# Patient Record
Sex: Female | Born: 1950
Health system: Southern US, Community
[De-identification: ages and names within clinical notes are randomized; demographics above are authoritative.]

## PROBLEM LIST (undated history)

## (undated) DIAGNOSIS — J301 Allergic rhinitis due to pollen: Secondary | ICD-10-CM

## (undated) DIAGNOSIS — M549 Dorsalgia, unspecified: Secondary | ICD-10-CM

## (undated) DIAGNOSIS — IMO0001 Reserved for inherently not codable concepts without codable children: Secondary | ICD-10-CM

## (undated) DIAGNOSIS — R252 Cramp and spasm: Secondary | ICD-10-CM

## (undated) DIAGNOSIS — R6889 Other general symptoms and signs: Secondary | ICD-10-CM

## (undated) DIAGNOSIS — K829 Disease of gallbladder, unspecified: Secondary | ICD-10-CM

## (undated) DIAGNOSIS — R5383 Other fatigue: Secondary | ICD-10-CM

## (undated) DIAGNOSIS — K219 Gastro-esophageal reflux disease without esophagitis: Secondary | ICD-10-CM

## (undated) DIAGNOSIS — M2550 Pain in unspecified joint: Secondary | ICD-10-CM

## (undated) DIAGNOSIS — G4730 Sleep apnea, unspecified: Secondary | ICD-10-CM

## (undated) DIAGNOSIS — F329 Major depressive disorder, single episode, unspecified: Secondary | ICD-10-CM

## (undated) DIAGNOSIS — F32A Depression, unspecified: Secondary | ICD-10-CM

## (undated) DIAGNOSIS — H409 Unspecified glaucoma: Secondary | ICD-10-CM

## (undated) DIAGNOSIS — R51 Headache: Secondary | ICD-10-CM

## (undated) DIAGNOSIS — R7303 Prediabetes: Secondary | ICD-10-CM

## (undated) DIAGNOSIS — E559 Vitamin D deficiency, unspecified: Secondary | ICD-10-CM

## (undated) DIAGNOSIS — H9319 Tinnitus, unspecified ear: Secondary | ICD-10-CM

## (undated) DIAGNOSIS — R531 Weakness: Secondary | ICD-10-CM

## (undated) DIAGNOSIS — F439 Reaction to severe stress, unspecified: Secondary | ICD-10-CM

## (undated) DIAGNOSIS — M5430 Sciatica, unspecified side: Secondary | ICD-10-CM

## (undated) DIAGNOSIS — R002 Palpitations: Secondary | ICD-10-CM

## (undated) DIAGNOSIS — F419 Anxiety disorder, unspecified: Secondary | ICD-10-CM

## (undated) DIAGNOSIS — E739 Lactose intolerance, unspecified: Secondary | ICD-10-CM

## (undated) DIAGNOSIS — M7989 Other specified soft tissue disorders: Secondary | ICD-10-CM

## (undated) DIAGNOSIS — S83209A Unspecified tear of unspecified meniscus, current injury, unspecified knee, initial encounter: Secondary | ICD-10-CM

## (undated) DIAGNOSIS — R519 Headache, unspecified: Secondary | ICD-10-CM

## (undated) DIAGNOSIS — R490 Dysphonia: Secondary | ICD-10-CM

## (undated) DIAGNOSIS — E039 Hypothyroidism, unspecified: Secondary | ICD-10-CM

## (undated) DIAGNOSIS — I10 Essential (primary) hypertension: Secondary | ICD-10-CM

## (undated) DIAGNOSIS — M1990 Unspecified osteoarthritis, unspecified site: Secondary | ICD-10-CM

## (undated) DIAGNOSIS — M6289 Other specified disorders of muscle: Secondary | ICD-10-CM

## (undated) DIAGNOSIS — D649 Anemia, unspecified: Secondary | ICD-10-CM

## (undated) DIAGNOSIS — R633 Feeding difficulties: Secondary | ICD-10-CM

## (undated) DIAGNOSIS — T7840XA Allergy, unspecified, initial encounter: Secondary | ICD-10-CM

## (undated) DIAGNOSIS — E669 Obesity, unspecified: Secondary | ICD-10-CM

## (undated) DIAGNOSIS — K0889 Other specified disorders of teeth and supporting structures: Secondary | ICD-10-CM

## (undated) DIAGNOSIS — K589 Irritable bowel syndrome without diarrhea: Secondary | ICD-10-CM

## (undated) DIAGNOSIS — R450 Nervousness: Secondary | ICD-10-CM

## (undated) DIAGNOSIS — R682 Dry mouth, unspecified: Secondary | ICD-10-CM

## (undated) DIAGNOSIS — E538 Deficiency of other specified B group vitamins: Secondary | ICD-10-CM

## (undated) HISTORY — DX: Allergy, unspecified, initial encounter: T78.40XA

## (undated) HISTORY — DX: Reaction to severe stress, unspecified: F43.9

## (undated) HISTORY — DX: Depression, unspecified: F32.A

## (undated) HISTORY — DX: Tinnitus, unspecified ear: H93.19

## (undated) HISTORY — DX: Deficiency of other specified B group vitamins: E53.8

## (undated) HISTORY — DX: Lactose intolerance, unspecified: E73.9

## (undated) HISTORY — DX: Dorsalgia, unspecified: M54.9

## (undated) HISTORY — DX: Nervousness: R45.0

## (undated) HISTORY — DX: Essential (primary) hypertension: I10

## (undated) HISTORY — DX: Headache, unspecified: R51.9

## (undated) HISTORY — DX: Feeding difficulties: R63.3

## (undated) HISTORY — DX: Allergic rhinitis due to pollen: J30.1

## (undated) HISTORY — DX: Major depressive disorder, single episode, unspecified: F32.9

## (undated) HISTORY — DX: Pain in unspecified joint: M25.50

## (undated) HISTORY — DX: Other specified disorders of muscle: M62.89

## (undated) HISTORY — DX: Palpitations: R00.2

## (undated) HISTORY — DX: Weakness: R53.1

## (undated) HISTORY — DX: Dysphonia: R49.0

## (undated) HISTORY — DX: Disease of gallbladder, unspecified: K82.9

## (undated) HISTORY — DX: Prediabetes: R73.03

## (undated) HISTORY — DX: Other general symptoms and signs: R68.89

## (undated) HISTORY — DX: Other fatigue: R53.83

## (undated) HISTORY — DX: Dry mouth, unspecified: R68.2

## (undated) HISTORY — DX: Irritable bowel syndrome, unspecified: K58.9

## (undated) HISTORY — DX: Other specified soft tissue disorders: M79.89

## (undated) HISTORY — DX: Other specified disorders of teeth and supporting structures: K08.89

## (undated) HISTORY — DX: Vitamin D deficiency, unspecified: E55.9

## (undated) HISTORY — DX: Anemia, unspecified: D64.9

## (undated) HISTORY — DX: Unspecified tear of unspecified meniscus, current injury, unspecified knee, initial encounter: S83.209A

## (undated) HISTORY — DX: Cramp and spasm: R25.2

## (undated) HISTORY — DX: Headache: R51

---

## 1988-11-20 HISTORY — PX: CHOLECYSTECTOMY: SHX55

## 2003-06-22 ENCOUNTER — Encounter: Admission: RE | Admit: 2003-06-22 | Discharge: 2003-07-06 | Payer: Self-pay | Admitting: Occupational Medicine

## 2005-09-11 ENCOUNTER — Other Ambulatory Visit: Admission: RE | Admit: 2005-09-11 | Discharge: 2005-09-11 | Payer: Self-pay | Admitting: Family Medicine

## 2007-05-13 ENCOUNTER — Other Ambulatory Visit: Admission: RE | Admit: 2007-05-13 | Discharge: 2007-05-13 | Payer: Self-pay | Admitting: Family Medicine

## 2008-05-13 ENCOUNTER — Other Ambulatory Visit: Admission: RE | Admit: 2008-05-13 | Discharge: 2008-05-13 | Payer: Self-pay | Admitting: Family Medicine

## 2008-05-15 ENCOUNTER — Ambulatory Visit (HOSPITAL_COMMUNITY): Admission: RE | Admit: 2008-05-15 | Discharge: 2008-05-15 | Payer: Self-pay | Admitting: Cardiology

## 2008-05-16 ENCOUNTER — Emergency Department (HOSPITAL_COMMUNITY): Admission: EM | Admit: 2008-05-16 | Discharge: 2008-05-16 | Payer: Self-pay | Admitting: Emergency Medicine

## 2008-05-30 ENCOUNTER — Emergency Department (HOSPITAL_COMMUNITY): Admission: EM | Admit: 2008-05-30 | Discharge: 2008-05-30 | Payer: Self-pay | Admitting: Emergency Medicine

## 2010-12-07 ENCOUNTER — Encounter
Admission: RE | Admit: 2010-12-07 | Discharge: 2010-12-20 | Payer: Self-pay | Source: Home / Self Care | Attending: Orthopaedic Surgery | Admitting: Orthopaedic Surgery

## 2010-12-22 ENCOUNTER — Ambulatory Visit: Payer: 59 | Attending: Orthopaedic Surgery | Admitting: Physical Therapy

## 2010-12-22 DIAGNOSIS — M542 Cervicalgia: Secondary | ICD-10-CM | POA: Insufficient documentation

## 2010-12-22 DIAGNOSIS — M2569 Stiffness of other specified joint, not elsewhere classified: Secondary | ICD-10-CM | POA: Insufficient documentation

## 2010-12-22 DIAGNOSIS — M545 Low back pain, unspecified: Secondary | ICD-10-CM | POA: Insufficient documentation

## 2010-12-22 DIAGNOSIS — R293 Abnormal posture: Secondary | ICD-10-CM | POA: Insufficient documentation

## 2010-12-22 DIAGNOSIS — IMO0001 Reserved for inherently not codable concepts without codable children: Secondary | ICD-10-CM | POA: Insufficient documentation

## 2010-12-22 DIAGNOSIS — R5381 Other malaise: Secondary | ICD-10-CM | POA: Insufficient documentation

## 2010-12-26 ENCOUNTER — Ambulatory Visit: Payer: 59 | Admitting: Physical Therapy

## 2010-12-29 ENCOUNTER — Encounter: Payer: Commercial Managed Care - PPO | Admitting: Physical Therapy

## 2010-12-30 ENCOUNTER — Ambulatory Visit: Payer: 59 | Admitting: Physical Therapy

## 2011-01-02 ENCOUNTER — Ambulatory Visit: Payer: 59 | Admitting: Physical Therapy

## 2011-01-04 ENCOUNTER — Ambulatory Visit: Payer: 59 | Admitting: Physical Therapy

## 2011-01-11 ENCOUNTER — Encounter: Payer: Commercial Managed Care - PPO | Admitting: Physical Therapy

## 2011-01-18 ENCOUNTER — Ambulatory Visit: Payer: 59 | Admitting: Physical Therapy

## 2011-01-25 ENCOUNTER — Encounter: Payer: Commercial Managed Care - PPO | Admitting: Physical Therapy

## 2011-02-01 ENCOUNTER — Ambulatory Visit: Payer: Commercial Managed Care - PPO | Attending: Orthopaedic Surgery | Admitting: Physical Therapy

## 2011-02-01 DIAGNOSIS — M542 Cervicalgia: Secondary | ICD-10-CM | POA: Insufficient documentation

## 2011-02-01 DIAGNOSIS — IMO0001 Reserved for inherently not codable concepts without codable children: Secondary | ICD-10-CM | POA: Insufficient documentation

## 2011-02-01 DIAGNOSIS — M2569 Stiffness of other specified joint, not elsewhere classified: Secondary | ICD-10-CM | POA: Insufficient documentation

## 2011-02-01 DIAGNOSIS — R293 Abnormal posture: Secondary | ICD-10-CM | POA: Insufficient documentation

## 2011-02-01 DIAGNOSIS — M545 Low back pain, unspecified: Secondary | ICD-10-CM | POA: Insufficient documentation

## 2011-02-01 DIAGNOSIS — R5381 Other malaise: Secondary | ICD-10-CM | POA: Insufficient documentation

## 2011-08-17 LAB — POCT CARDIAC MARKERS
CKMB, poc: 4.1
Myoglobin, poc: 157
Operator id: 294521

## 2011-08-17 LAB — DIFFERENTIAL
Basophils Absolute: 0
Eosinophils Absolute: 0.1
Lymphocytes Relative: 19
Lymphs Abs: 2.1
Neutrophils Relative %: 72

## 2011-08-17 LAB — POCT I-STAT, CHEM 8
Creatinine, Ser: 1
HCT: 41
Hemoglobin: 13.9
Potassium: 3.8
Sodium: 136

## 2011-08-17 LAB — CBC
MCV: 90.9
Platelets: 371
WBC: 11 — ABNORMAL HIGH

## 2011-08-21 ENCOUNTER — Other Ambulatory Visit: Payer: Self-pay | Admitting: Family Medicine

## 2011-08-21 ENCOUNTER — Other Ambulatory Visit (HOSPITAL_COMMUNITY)
Admission: RE | Admit: 2011-08-21 | Discharge: 2011-08-21 | Disposition: A | Discharge status: Home / Self Care | Payer: Commercial Managed Care - PPO | Source: Ambulatory Visit | Attending: Family Medicine | Admitting: Family Medicine

## 2011-08-21 DIAGNOSIS — Z0000 Encounter for general adult medical examination without abnormal findings: Secondary | ICD-10-CM | POA: Insufficient documentation

## 2011-10-19 ENCOUNTER — Ambulatory Visit (HOSPITAL_COMMUNITY)
Admission: RE | Admit: 2011-10-19 | Discharge: 2011-10-19 | Disposition: A | Discharge status: Home / Self Care | Payer: 59 | Source: Ambulatory Visit | Attending: Cardiology | Admitting: Cardiology

## 2011-10-19 DIAGNOSIS — R079 Chest pain, unspecified: Secondary | ICD-10-CM | POA: Insufficient documentation

## 2011-10-19 DIAGNOSIS — I10 Essential (primary) hypertension: Secondary | ICD-10-CM | POA: Insufficient documentation

## 2012-05-29 ENCOUNTER — Ambulatory Visit: Payer: 59 | Attending: Orthopaedic Surgery

## 2012-05-29 DIAGNOSIS — IMO0001 Reserved for inherently not codable concepts without codable children: Secondary | ICD-10-CM | POA: Insufficient documentation

## 2012-05-29 DIAGNOSIS — M545 Low back pain, unspecified: Secondary | ICD-10-CM | POA: Insufficient documentation

## 2012-05-29 DIAGNOSIS — R5381 Other malaise: Secondary | ICD-10-CM | POA: Insufficient documentation

## 2012-05-29 DIAGNOSIS — M25569 Pain in unspecified knee: Secondary | ICD-10-CM | POA: Insufficient documentation

## 2012-06-03 ENCOUNTER — Ambulatory Visit: Payer: 59 | Admitting: Physical Therapy

## 2012-06-10 ENCOUNTER — Ambulatory Visit: Payer: 59 | Admitting: Physical Therapy

## 2012-06-17 ENCOUNTER — Ambulatory Visit: Payer: 59 | Admitting: Physical Therapy

## 2012-06-24 ENCOUNTER — Ambulatory Visit: Payer: 59 | Attending: Orthopaedic Surgery | Admitting: Physical Therapy

## 2012-06-24 DIAGNOSIS — IMO0001 Reserved for inherently not codable concepts without codable children: Secondary | ICD-10-CM | POA: Insufficient documentation

## 2012-06-24 DIAGNOSIS — M545 Low back pain, unspecified: Secondary | ICD-10-CM | POA: Insufficient documentation

## 2012-06-24 DIAGNOSIS — R5381 Other malaise: Secondary | ICD-10-CM | POA: Insufficient documentation

## 2012-06-24 DIAGNOSIS — M25569 Pain in unspecified knee: Secondary | ICD-10-CM | POA: Insufficient documentation

## 2012-08-21 ENCOUNTER — Ambulatory Visit (INDEPENDENT_AMBULATORY_CARE_PROVIDER_SITE_OTHER): Payer: 59 | Admitting: Endocrinology

## 2012-08-21 ENCOUNTER — Encounter: Payer: Self-pay | Admitting: Endocrinology

## 2012-08-21 VITALS — BP 116/82 | HR 80 | Temp 97.8°F | Resp 16 | Wt 219.3 lb

## 2012-08-21 DIAGNOSIS — R002 Palpitations: Secondary | ICD-10-CM

## 2012-08-21 DIAGNOSIS — E039 Hypothyroidism, unspecified: Secondary | ICD-10-CM | POA: Insufficient documentation

## 2012-08-21 NOTE — Progress Notes (Signed)
  Subjective:    Patient ID: Kayla Marshall, female    DOB: August 02, 1951, 61 y.o.   MRN: [id number]  HPI Pt states 12 years of slight dryness of the hands, and assoc anxiety.  She reports many years of hypothyroidism.   Past Medical History  Diagnosis Date  . Hypertension     Past Surgical History  Procedure Date  . Cholecystectomy 1990    History   Social History  . Marital Status: Divorced    Spouse Name: N/A    Number of Children: N/A  . Years of Education: N/A   Occupational History  . Not on file.   Social History Main Topics  . Smoking status: Never Smoker   . Smokeless tobacco: Not on file  . Alcohol Use: Yes  . Drug Use: No  . Sexually Active: Not on file   Other Topics Concern  . Not on file   Social History Narrative  . No narrative on file    Current Outpatient Prescriptions on File Prior to Visit  Medication Sig Dispense Refill  . lisinopril (PRINIVIL,ZESTRIL) 2.5 MG tablet Take 2.5 mg by mouth daily.      Marland Kitchen thyroid (ARMOUR) 15 MG tablet Take 15 mg by mouth daily.        Allergies  Allergen Reactions  . Erythromycin     No family history on file.  BP 116/82  Pulse 80  Temp 97.8 F (36.6 C) (Oral)  Resp 16  Wt 219 lb 5 oz (99.479 kg)  SpO2 96%  Review of Systems Denies headache, hoarseness, double vision, sob, diarrhea, polyuria, myalgias, excessive diaphoresis, numbness, tremor, anxiety, hypoglycemia, easy bruising, and rhinorrhea.  She has difficulty with concentration, palpitations, heat intolerance, and difficulty losing weight.      Objective:   Physical Exam VS: see vs page GEN: no distress HEAD: head: no deformity eyes: no periorbital swelling, no proptosis external nose and ears are normal mouth: no lesion seen NECK: supple, thyroid is not enlarged CHEST WALL: no deformity LUNGS:  Clear to auscultation CV: reg rate and rhythm, no murmur ABD: abdomen is soft, nontender.  no hepatosplenomegaly.  not distended.  no  hernia MUSCULOSKELETAL: muscle bulk and strength are grossly normal.  no obvious joint swelling.  gait is normal and steady EXTEMITIES: no deformity.  no ulcer on the feet.  feet are of normal color and temp.  no edema PULSES: dorsalis pedis intact bilat.  no carotid bruit NEURO:  cn 2-12 grossly intact.   readily moves all 4's.  sensation is intact to touch on the feet SKIN:  Normal texture and temperature.  No rash or suspicious lesion is visible.   NODES:  None palpable at the neck PSYCH: alert, oriented x3.  Does not appear anxious nor depressed.  Lab Results  Component Value Date   TSH 1.225 08/21/2012      Assessment & Plan:  Reported h/o primary hypothyroidism, on replacement.  TSH is normal, but this cannot be interpreted as euthyroidism in the setting of armour thyroid intake Anxiety; uncertain if this is related to armour thyroid. Obesity, not thyroid-related

## 2012-08-21 NOTE — Patient Instructions (Addendum)
blood and urine tests are being requested for you today.  You will be contacted with results.

## 2012-08-23 ENCOUNTER — Other Ambulatory Visit: Payer: Self-pay | Admitting: Endocrinology

## 2012-08-23 ENCOUNTER — Telehealth: Payer: Self-pay | Admitting: General Practice

## 2012-08-23 DIAGNOSIS — E039 Hypothyroidism, unspecified: Secondary | ICD-10-CM

## 2012-08-23 LAB — CATECHOLAMINES, FRACTIONATED, URINE, 24 HOUR

## 2012-08-23 LAB — METANEPHRINES, URINE, 24 HOUR

## 2012-08-23 MED ORDER — LEVOTHYROXINE SODIUM 75 MCG PO TABS: 75.0000 ug | ORAL_TABLET | Freq: Every day | ORAL | Status: DC

## 2012-08-23 NOTE — Telephone Encounter (Signed)
Pt stated she will be fine with the new Rx for Levothyroxine. Please send to [facility].

## 2012-08-23 NOTE — Telephone Encounter (Signed)
Message copied by Jackson Latino on Fri Aug 23, 2012  9:33 AM ------      Message from: Romero Belling      Created: Thu Aug 22, 2012 11:00 AM       please call patient:      Thyroid is normal      we'll call back with urine results

## 2012-08-23 NOTE — Telephone Encounter (Signed)
Pt.notified

## 2012-08-23 NOTE — Telephone Encounter (Signed)
i changed and sent rx Please go back to the lab in 1 month, to recheck thyroid blood test

## 2012-09-12 ENCOUNTER — Telehealth: Payer: Self-pay | Admitting: Endocrinology

## 2012-09-12 LAB — CATECHOLAMINES, FRACTIONATED, URINE, 24 HOUR
Calculated Total (E+NE): 26 mcg/24 h (ref 26–121)
Creatinine, Urine mg/day-CATEUR: 1 g/(24.h) (ref 0.63–2.50)
Dopamine, 24 hr Urine: 191 mcg/24 h (ref 52–480)
Norepinephrine, 24 hr Ur: 26 mcg/24 h (ref 15–100)

## 2012-09-12 NOTE — Telephone Encounter (Signed)
Please take the levothyroxine, as the other is not safe for you.

## 2012-09-12 NOTE — Telephone Encounter (Signed)
LMOVM notifying Pt that med was unsafe for her and she needs to stay with levothyroxine.

## 2012-09-12 NOTE — Telephone Encounter (Signed)
Last time pt was here, Dr. Everardo All changed her thyroid scrip to Synthroid for cost purposes. Pt thought about it and wants to continue taking the compound she was taking before, USP 15 mg. Can we call an rx in to [facility] at [phone]?

## 2014-11-24 ENCOUNTER — Other Ambulatory Visit (HOSPITAL_COMMUNITY): Payer: Self-pay | Admitting: Family Medicine

## 2014-11-24 ENCOUNTER — Other Ambulatory Visit: Payer: Self-pay | Admitting: Family Medicine

## 2014-11-24 DIAGNOSIS — M5441 Lumbago with sciatica, right side: Secondary | ICD-10-CM

## 2014-11-30 ENCOUNTER — Ambulatory Visit
Admission: RE | Admit: 2014-11-30 | Discharge: 2014-11-30 | Disposition: A | Discharge status: Home / Self Care | Payer: 59 | Source: Ambulatory Visit | Attending: Family Medicine | Admitting: Family Medicine

## 2014-11-30 DIAGNOSIS — M5441 Lumbago with sciatica, right side: Secondary | ICD-10-CM

## 2014-12-01 ENCOUNTER — Other Ambulatory Visit: Payer: Self-pay | Admitting: Family Medicine

## 2014-12-01 DIAGNOSIS — M549 Dorsalgia, unspecified: Secondary | ICD-10-CM

## 2014-12-14 ENCOUNTER — Ambulatory Visit
Admission: RE | Admit: 2014-12-14 | Discharge: 2014-12-14 | Disposition: A | Discharge status: Home / Self Care | Payer: 59 | Source: Ambulatory Visit | Attending: Family Medicine | Admitting: Family Medicine

## 2014-12-14 DIAGNOSIS — M549 Dorsalgia, unspecified: Secondary | ICD-10-CM

## 2015-04-05 DIAGNOSIS — I10 Essential (primary) hypertension: Secondary | ICD-10-CM | POA: Insufficient documentation

## 2015-05-05 ENCOUNTER — Ambulatory Visit (HOSPITAL_BASED_OUTPATIENT_CLINIC_OR_DEPARTMENT_OTHER): Payer: 59

## 2015-05-10 ENCOUNTER — Ambulatory Visit (HOSPITAL_BASED_OUTPATIENT_CLINIC_OR_DEPARTMENT_OTHER): Payer: 59

## 2015-05-12 ENCOUNTER — Ambulatory Visit (HOSPITAL_BASED_OUTPATIENT_CLINIC_OR_DEPARTMENT_OTHER): Payer: 59 | Attending: Family Medicine | Admitting: Radiology

## 2015-05-12 DIAGNOSIS — G4710 Hypersomnia, unspecified: Secondary | ICD-10-CM | POA: Diagnosis present

## 2015-05-12 DIAGNOSIS — R0683 Snoring: Secondary | ICD-10-CM | POA: Insufficient documentation

## 2015-05-12 DIAGNOSIS — G4731 Primary central sleep apnea: Secondary | ICD-10-CM | POA: Diagnosis not present

## 2015-05-12 DIAGNOSIS — G4733 Obstructive sleep apnea (adult) (pediatric): Secondary | ICD-10-CM | POA: Diagnosis not present

## 2015-05-22 DIAGNOSIS — G4733 Obstructive sleep apnea (adult) (pediatric): Secondary | ICD-10-CM | POA: Diagnosis not present

## 2015-05-22 NOTE — Progress Notes (Signed)
    NAME: Kayla Marshall DATE OF BIRTH:  Dec 18, 1950 MEDICAL RECORD NUMBER [id number]  LOCATION: [facility]  PHYSICIAN: Charlesetta Milliron D  DATE OF STUDY: 05/12/2015  SLEEP STUDY TYPE: Out of Center Sleep Test- Unattended                REFERRING PHYSICIAN: Harlan Stains, MD  INDICATION FOR STUDY: Hypersomnia with sleep apnea  EPWORTH SLEEPINESS SCORE:  9/24 HEIGHT:   5'3" WEIGHT:   230 lbs   BMI 41 NECK SIZE:   in.  MEDICATIONS: Charted for review  IMPRESSION:  Moderate obstructive and central sleep apnea/hypopnea syndrome, AHI 22.5 per hour. 146 total events scored including 28 central apneas, 18 obstructive apneas, 2 mixed apneas, 98 hypopneas. Sleep position not specified. Snoring with oxygen desaturation to a nadir of 81% and average oxygen saturation 92% on room air. Mean heart rate 69.4 bpm.    RECOMMENDATION:  Scores in this range would usually be addressed first with CPAP. Other interventions including oral appliances and surgical evaluation might be considered on an individual basis. Weight loss and encouragement to sleep off flat of back may be appropriate.   Deneise Lever Diplomate, American Board of Sleep Medicine  ELECTRONICALLY SIGNED ON:  05/22/2015, 11:04 AM [place] PH: [phone]   FX: [phone] [place]

## 2015-07-07 ENCOUNTER — Telehealth: Payer: Self-pay | Admitting: Internal Medicine

## 2015-07-07 ENCOUNTER — Institutional Professional Consult (permissible substitution): Payer: 59 | Admitting: Pulmonary Disease

## 2015-07-07 NOTE — Telephone Encounter (Signed)
error 

## 2015-08-10 ENCOUNTER — Ambulatory Visit
Admission: RE | Admit: 2015-08-10 | Discharge: 2015-08-10 | Disposition: A | Discharge status: Home / Self Care | Payer: 59 | Source: Ambulatory Visit | Attending: Family Medicine | Admitting: Family Medicine

## 2015-08-10 ENCOUNTER — Other Ambulatory Visit: Payer: Self-pay | Admitting: Family Medicine

## 2015-08-10 DIAGNOSIS — M25531 Pain in right wrist: Secondary | ICD-10-CM

## 2015-08-20 ENCOUNTER — Telehealth: Payer: Self-pay | Admitting: Endocrinology

## 2015-08-20 NOTE — Telephone Encounter (Signed)
Patient can be scheduled here for a 30 min office visit, but please obtain recent thyroid labs.

## 2015-08-20 NOTE — Telephone Encounter (Signed)
DR Loanne Drilling saw patient in 2013 and would like to now if he would see her again Kayla Marshall is having trouble sending medical records over. And do she have to come back as a new patient, please advise

## 2015-08-26 ENCOUNTER — Encounter (INDEPENDENT_AMBULATORY_CARE_PROVIDER_SITE_OTHER): Payer: Self-pay

## 2015-08-26 ENCOUNTER — Encounter: Payer: Self-pay | Admitting: Pulmonary Disease

## 2015-08-26 ENCOUNTER — Ambulatory Visit (INDEPENDENT_AMBULATORY_CARE_PROVIDER_SITE_OTHER): Payer: 59 | Admitting: Pulmonary Disease

## 2015-08-26 VITALS — BP 122/60 | HR 62 | Ht 63.5 in | Wt 228.4 lb

## 2015-08-26 DIAGNOSIS — Z9989 Dependence on other enabling machines and devices: Secondary | ICD-10-CM

## 2015-08-26 DIAGNOSIS — Z2821 Immunization not carried out because of patient refusal: Secondary | ICD-10-CM

## 2015-08-26 DIAGNOSIS — G4733 Obstructive sleep apnea (adult) (pediatric): Secondary | ICD-10-CM

## 2015-08-26 NOTE — Assessment & Plan Note (Addendum)
The pathophysiology of obstructive sleep apnea , it's cardiovascular consequences & modes of treatment including CPAP were discused with the patient in detail & they evidenced understanding.  Reviewed home sleep study with her She has  moderate obstructive sleep apnea . She would really like to avoid the sleep lab and a formal titration study We will set you up with [facility] with an autoCPAP machine  Weight loss encouraged, compliance with goal of at least 4-6 hrs every night is the expectation. Advised against medications with sedative side effects Cautioned against driving when sleepy - understanding that sleepiness will vary on a day to day basis

## 2015-08-26 NOTE — Progress Notes (Signed)
   Subjective:    Patient ID: Kayla Marshall, female    DOB: [birth year], 64 y.o.   MRN: [id number]  HPI   Chief Complaint  Patient presents with  . sleep consult    reffered by Dr. Caren Griffins white.pt. states she wears CPAP 6 hours every night. needs a new CPAP. worried about weight.   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 She underwent sleep study in 2008 and has been maintained on CPAP since then with a nasal mask. Her machine is old now , not been calibrated for many years and she requests a new machine. She would also like to change DME. She did not tolerate nasal pillows in the past. Epworth sleepiness score is 6. Bedtime is around 11 PM, sleep latency is minimal, she sleeps on her side with one pillow, reports one to 2 nocturnal awakenings and is out of bed by 5 AM on workdays feeling rested without dryness of mouth or headaches. On days off, she will stay in bed until 8 AM. She reports 30 pound weight gain over the past few years.  05/2015 HST showed AHI 22/hour with lowest desaturation of 81%, positional component was not documented. Labs 01/2015 including TSH were normal  There is no history suggestive of cataplexy, sleep paralysis or parasomnias    Past Medical History  Diagnosis Date  . Hypertension    Past Surgical History  Procedure Laterality Date  . Cholecystectomy  1990    Allergies  Allergen Reactions  . Erythromycin     Social History   Social History  . Marital Status: Divorced    Spouse Name: N/A  . Number of Children: N/A  . Years of Education: N/A   Occupational History  . Not on file.   Social History Main Topics  . Smoking status: Never Smoker   . Smokeless tobacco: Not on file  . Alcohol Use: Yes  . Drug Use: No  . Sexual Activity: Not on file   Other Topics Concern  . Not on file   Social History Narrative    No family history on file.    Review of Systems  Constitutional: Negative for fever  and unexpected weight change.  HENT: Negative for congestion, dental problem, ear pain, nosebleeds, postnasal drip, rhinorrhea, sinus pressure, sneezing, sore throat and trouble swallowing.   Eyes: Negative for redness and itching.  Respiratory: Negative for cough, chest tightness, shortness of breath and wheezing.   Cardiovascular: Negative for palpitations and leg swelling.  Gastrointestinal: Negative for nausea and vomiting.  Genitourinary: Negative for dysuria.  Musculoskeletal: Negative for joint swelling.  Skin: Negative for rash.  Neurological: Negative for headaches.  Hematological: Does not bruise/bleed easily.  Psychiatric/Behavioral: Negative for dysphoric mood. The patient is not nervous/anxious.        Objective:   Physical Exam  Gen. Pleasant, obese, in no distress, normal affect ENT - no lesions, no post nasal drip, class 2-3 airway Neck: No JVD, no thyromegaly, no carotid bruits Lungs: no use of accessory muscles, no dullness to percussion, decreased without rales or rhonchi  Cardiovascular: Rhythm regular, heart sounds  normal, no murmurs or gallops, no peripheral edema Abdomen: soft and non-tender, no hepatosplenomegaly, BS normal. Musculoskeletal: No deformities, no cyanosis or clubbing Neuro:  alert, non focal, no tremors       Assessment & Plan:

## 2015-08-26 NOTE — Patient Instructions (Signed)
You have moderate obstructive sleep apnea  We will set you up with [facility] with an autoCPAP machine

## 2015-09-22 ENCOUNTER — Institutional Professional Consult (permissible substitution): Payer: 59 | Admitting: Internal Medicine

## 2015-09-29 ENCOUNTER — Encounter: Payer: Self-pay | Admitting: Podiatry

## 2015-09-29 ENCOUNTER — Ambulatory Visit (INDEPENDENT_AMBULATORY_CARE_PROVIDER_SITE_OTHER): Payer: 59 | Admitting: Podiatry

## 2015-09-29 ENCOUNTER — Ambulatory Visit (INDEPENDENT_AMBULATORY_CARE_PROVIDER_SITE_OTHER): Payer: 59

## 2015-09-29 VITALS — BP 133/76 | HR 73 | Resp 18

## 2015-09-29 DIAGNOSIS — R52 Pain, unspecified: Secondary | ICD-10-CM

## 2015-09-29 DIAGNOSIS — M722 Plantar fascial fibromatosis: Secondary | ICD-10-CM | POA: Diagnosis not present

## 2015-09-29 MED ORDER — MELOXICAM 15 MG PO TABS: 15.0000 mg | ORAL_TABLET | Freq: Every day | ORAL | Status: DC

## 2015-09-29 NOTE — Patient Instructions (Signed)

## 2015-09-29 NOTE — Progress Notes (Signed)
   Subjective:    Patient ID: Kayla Marshall, female    DOB: [birth year], 64 y.o.   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      Objective:   Physical Exam: I have reviewed her past mental history medications allergies surgeon social history. She is a 64 year old white female in no apparent distress. Vital signs stable alert and oriented 3 pulses are strongly palpable. Neurologic sensorium is intact per Semmes-Weinstein monofilament. Deep tendon reflexes intact bilateral and muscle strength +5 over 5 dorsiflex plantar flexors and inverters everters AND musculature is intact. Orthopedic evaluation demonstrates all joints distal to the ankle and full range of motion without crepitation. Cutaneous evaluation demonstrates supple well-hydrated cutis. Radiographs demonstrate soft tissue increase in density at the plantar fascial calcaneal insertion site bilateral. She does have pain on palpation medial calcaneal tubercle bilateral.        Assessment & Plan:  Assessment: Plantar fasciitis bilateral with forefoot compensation.  Plan: Discussed etiology pathology conservative versus surgical therapies. At this point injected her heels bilaterally today with Kenalog and local anesthetic placed her in a plantar fascial brace and a night splint. We discussed appropriate shoe gear stretching exercises and ice therapy. I offered her a prescription for a steroid which she declined but she will continue her meloxicam. We will follow up with her in 1 month.

## 2015-10-06 ENCOUNTER — Telehealth: Payer: Self-pay | Admitting: Internal Medicine

## 2015-10-06 NOTE — Telephone Encounter (Signed)
Spoke with pt.  States she received a VM from someone from our office regarding cpap.  States cpap was set up sometime in Oct through [facility].  She confirmed pending appt with TP on this Friday.  I do not see documention of someone from our office calling.  Estill Bamberg or [place], did either of you call pt?

## 2015-10-06 NOTE — Telephone Encounter (Signed)
Called patient off of my Nov List.  Wanted to make sure her CPAP was set up before her appointment.  Nothing further needed. Closing encounter

## 2015-10-08 ENCOUNTER — Ambulatory Visit: Payer: 59 | Admitting: Adult Health

## 2015-10-19 ENCOUNTER — Telehealth: Payer: Self-pay | Admitting: Adult Health

## 2015-10-19 NOTE — Telephone Encounter (Signed)
Last ov with RA 08/26/15 Pt canceled 6 week ov with TP for 10/08/15  Per TP after reviewing CPAP download Excellent control and compliance Keep up the good work Patient needs ov with RA in 3 months  LVM for patient to return call

## 2015-10-20 NOTE — Telephone Encounter (Signed)
LVM for pt to return call

## 2015-10-21 ENCOUNTER — Encounter (HOSPITAL_COMMUNITY): Payer: Self-pay

## 2015-10-21 ENCOUNTER — Emergency Department (HOSPITAL_COMMUNITY): Payer: 59

## 2015-10-21 ENCOUNTER — Emergency Department (HOSPITAL_COMMUNITY)
Admission: EM | Admit: 2015-10-21 | Discharge: 2015-10-21 | Disposition: A | Discharge status: Home / Self Care | Payer: 59 | Attending: Emergency Medicine | Admitting: Emergency Medicine

## 2015-10-21 DIAGNOSIS — N939 Abnormal uterine and vaginal bleeding, unspecified: Secondary | ICD-10-CM

## 2015-10-21 DIAGNOSIS — Z8739 Personal history of other diseases of the musculoskeletal system and connective tissue: Secondary | ICD-10-CM | POA: Diagnosis not present

## 2015-10-21 DIAGNOSIS — Z9049 Acquired absence of other specified parts of digestive tract: Secondary | ICD-10-CM | POA: Diagnosis not present

## 2015-10-21 DIAGNOSIS — Z3202 Encounter for pregnancy test, result negative: Secondary | ICD-10-CM | POA: Diagnosis not present

## 2015-10-21 DIAGNOSIS — Z791 Long term (current) use of non-steroidal anti-inflammatories (NSAID): Secondary | ICD-10-CM | POA: Insufficient documentation

## 2015-10-21 DIAGNOSIS — R102 Pelvic and perineal pain: Secondary | ICD-10-CM

## 2015-10-21 DIAGNOSIS — C541 Malignant neoplasm of endometrium: Secondary | ICD-10-CM | POA: Insufficient documentation

## 2015-10-21 DIAGNOSIS — N950 Postmenopausal bleeding: Secondary | ICD-10-CM | POA: Insufficient documentation

## 2015-10-21 DIAGNOSIS — I10 Essential (primary) hypertension: Secondary | ICD-10-CM | POA: Diagnosis not present

## 2015-10-21 DIAGNOSIS — Z79899 Other long term (current) drug therapy: Secondary | ICD-10-CM | POA: Insufficient documentation

## 2015-10-21 HISTORY — DX: Sciatica, unspecified side: M54.30

## 2015-10-21 LAB — URINALYSIS, ROUTINE W REFLEX MICROSCOPIC
BILIRUBIN URINE: NEGATIVE
Glucose, UA: NEGATIVE mg/dL
Ketones, ur: NEGATIVE mg/dL
Leukocytes, UA: NEGATIVE
Nitrite: NEGATIVE
PH: 6.5 (ref 5.0–8.0)
Protein, ur: NEGATIVE mg/dL
SPECIFIC GRAVITY, URINE: 1.008 (ref 1.005–1.030)

## 2015-10-21 LAB — CBC WITH DIFFERENTIAL/PLATELET
BASOS ABS: 0 10*3/uL (ref 0.0–0.1)
BASOS PCT: 0 %
EOS ABS: 0.1 10*3/uL (ref 0.0–0.7)
Eosinophils Relative: 2 %
HCT: 39 % (ref 36.0–46.0)
Hemoglobin: 13.1 g/dL (ref 12.0–15.0)
Lymphocytes Relative: 17 %
Lymphs Abs: 1.6 10*3/uL (ref 0.7–4.0)
MCH: 31.1 pg (ref 26.0–34.0)
MCHC: 33.6 g/dL (ref 30.0–36.0)
MCV: 92.6 fL (ref 78.0–100.0)
MONO ABS: 0.7 10*3/uL (ref 0.1–1.0)
Monocytes Relative: 7 %
NEUTROS ABS: 6.9 10*3/uL (ref 1.7–7.7)
NEUTROS PCT: 74 %
PLATELETS: 292 10*3/uL (ref 150–400)
RBC: 4.21 MIL/uL (ref 3.87–5.11)
RDW: 13.6 % (ref 11.5–15.5)
WBC: 9.3 10*3/uL (ref 4.0–10.5)

## 2015-10-21 LAB — URINE MICROSCOPIC-ADD ON

## 2015-10-21 LAB — WET PREP, GENITAL
Clue Cells Wet Prep HPF POC: NONE SEEN
SPERM: NONE SEEN
Trich, Wet Prep: NONE SEEN
Yeast Wet Prep HPF POC: NONE SEEN

## 2015-10-21 LAB — HCG, QUANTITATIVE, PREGNANCY: HCG, BETA CHAIN, QUANT, S: 2 m[IU]/mL (ref <5)

## 2015-10-21 MED ORDER — IBUPROFEN 800 MG PO TABS
800.0000 mg | ORAL_TABLET | Freq: Three times a day (TID) | ORAL | Status: DC
Start: 2015-10-21 — End: 2017-12-04

## 2015-10-21 NOTE — Telephone Encounter (Signed)
Pt returning call and can be reached @ [phone] pt stating that she can't be reached until tomorrow because she works all night.Kayla Marshall

## 2015-10-21 NOTE — ED Notes (Signed)
Pt is requesting food and drink.

## 2015-10-21 NOTE — ED Notes (Signed)
PT C/O BRIGHT RED VAGINAL BLEEDING AND LOWER PAIN AFTER URINATING AT 1230 TODAY. DENIES INJURY, BUT STATES SHE WAS SEEN BY HER CHIROPRACTOR YESTERDAY, AND HE PRESSED REALLY HARD ON HER LOWER ABDOMEN.

## 2015-10-21 NOTE — Discharge Instructions (Signed)
1. Medications: usual home medications 2. Treatment: rest, drink plenty of fluids,  3. Follow Up: Please followup with OB/GYN days for evaluation of your bleeding discussion of your diagnoses and further evaluation after today's visit; if you do not have a primary care doctor use the resource guide provided to find one; Please return to the ER for worsening symptoms; syncope or other concerns.  Follow-up is imperative as this appears to be endometrial cancer.        Postmenopausal Bleeding Postmenopausal bleeding is any bleeding a woman has after she has entered into menopause. Menopause is the end of a woman's fertile years. After menopause, a woman no longer ovulates or has menstrual periods.  Postmenopausal bleeding can be caused by various things. Any type of postmenopausal bleeding, even if it appears to be a typical menstrual period, is concerning. This should be evaluated by your health care provider. Any treatment will depend on the cause of the bleeding. HOME CARE INSTRUCTIONS Monitor your condition for any changes. The following actions may help to alleviate any discomfort you are experiencing:  Avoid the use of tampons and douches as directed by your health care provider.  Change your pads frequently.  Get regular pelvic exams and Pap tests.  Keep all follow-up appointments for diagnostic tests as directed by your health care provider. SEEK MEDICAL CARE IF:   Your bleeding lasts more than 1 week.  You have abdominal pain.  You have bleeding with sexual intercourse. SEEK IMMEDIATE MEDICAL CARE IF:   You have a fever, chills, headache, dizziness, muscle aches, and bleeding.  You have severe pain with bleeding.  You are passing blood clots.  You have bleeding and need more than 1 pad an hour.  You feel faint. MAKE SURE YOU:  Understand these instructions.  Will watch your condition.  Will get help right away if you are not doing well or get worse.   This  information is not intended to replace advice given to you by your health care provider. Make sure you discuss any questions you have with your health care provider.   Document Released: 02/14/2006 Document Revised: 08/27/2013 Document Reviewed: 06/05/2013 Elsevier Interactive Patient Education [employer].

## 2015-10-21 NOTE — ED Notes (Signed)
Patient states she has had bright red vaginal bleeding since 12:30 today.  Patient also saw clots in the bleeding.  Patient denies history of vaginal bleeding and her LMP was in the 1990's.  Patient still has all reproductive organs.  Patient has not had a paper smear in "years."  Patient states she always has shortness of breath because of her weight.  She states she always has anxiety.  Patient denies N/V/D.  Patient endorses low abdominal pain since Monday and states that started after she carried a chair down steps.  Patient has taken Meloxicam for back pain.  Patient denies [place] of blood thinners.  Patient denies headache and changes in vision.

## 2015-10-21 NOTE — Telephone Encounter (Signed)
Per pt request, not to call until tomorrow.

## 2015-10-21 NOTE — ED Provider Notes (Signed)
CSN: [id number]     Arrival date & time 10/21/15  1249 History   First MD Initiated Contact with Patient 10/21/15 1503     Chief Complaint  Patient presents with  . Vaginal Bleeding    BRIGHT RED x30 MIN PTA     (Consider location/radiation/quality/duration/timing/severity/associated sxs/prior Treatment) Patient is a 64 y.o. female presenting with vaginal bleeding. The history is provided by the patient and medical records. No language interpreter was used.  Vaginal Bleeding Associated symptoms: abdominal pain (Lower, cramping in nature)   Associated symptoms: no back pain, no dysuria, no fatigue, no fever and no nausea      Kayla Marshall is a 64 y.o. female  with a hx of retention, sciatica, cholecystectomy, glaucoma presents to the Emergency Department complaining of gradual, persistent, progressively worsening vaginal bleeding onset 12:30 PM today. Patient reports her last Alroy Dust cycle was in the 1990s but at that time she had been diagnosed with uterine fibroids. She has never had a hysterectomy. Patient reports that she has had lower abdominal pain this and Monday after she carried a chair down the steps in her home. She reports that she has chronic sciatica and this pain often radiates from her low back into her lower abdomen therefore she was not concerned about this pain as it was unchanged from previous. She's been taking meloxicam for her back pain. Patient also reports that she saw a new chiropractor yesterday who pressed on her right lower abdomen but this did not cause her pain. She has no abdominal pain now. She reports urinary urgency and urinary frequency but denies dysuria.  Patient denies night sweats, weight loss.  No aerating or alleviating factors. Patient denies fever, chills, headache or neck pain, chest pain, shortness of breath, nausea, vomiting, diarrhea, weakness, dizziness, syncope, dysuria.   Past Medical History  Diagnosis Date  . Hypertension   . Sciatica     Past Surgical History  Procedure Laterality Date  . Cholecystectomy  1990   History reviewed. No pertinent family history. Social History  Substance Use Topics  . Smoking status: Never Smoker   . Smokeless tobacco: None  . Alcohol Use: No   OB History    No data available     Review of Systems  Constitutional: Negative for fever, diaphoresis, appetite change, fatigue and unexpected weight change.  HENT: Negative for mouth sores.   Eyes: Negative for visual disturbance.  Respiratory: Negative for cough, chest tightness, shortness of breath and wheezing.   Cardiovascular: Negative for chest pain.  Gastrointestinal: Positive for abdominal pain (Lower, cramping in nature). Negative for nausea, vomiting, diarrhea and constipation.  Endocrine: Negative for polydipsia, polyphagia and polyuria.  Genitourinary: Positive for vaginal bleeding. Negative for dysuria, urgency, frequency and hematuria.  Musculoskeletal: Negative for back pain and neck stiffness.  Skin: Negative for rash.  Allergic/Immunologic: Negative for immunocompromised state.  Neurological: Negative for syncope, light-headedness and headaches.  Hematological: Does not bruise/bleed easily.  Psychiatric/Behavioral: Negative for sleep disturbance. The patient is not nervous/anxious.       Allergies  Erythromycin; Meloxicam; and Moxifloxacin  Home Medications   Prior to Admission medications   Medication Sig Start Date End Date Taking? Authorizing Provider  ALPRAZolam (XANAX) 0.25 MG tablet Take 0.25 mg by mouth 2 (two) times daily as needed for anxiety or sleep.  08/02/15  Yes Historical Provider, MD  Beta Glucan POWD 1 tablet by Does not apply route daily.   Yes Historical Provider, MD  brimonidine (ALPHAGAN P) 0.1 %  SOLN Place 1 drop into both eyes 2 (two) times daily.   Yes Historical Provider, MD  CALCIUM PO Take 2 tablets by mouth at bedtime and may repeat dose one time if needed. With magnesium.   Yes  Historical Provider, MD  cyclobenzaprine (FLEXERIL) 10 MG tablet Take 10 mg by mouth daily. 08/09/15  Yes Historical Provider, MD  ibuprofen (ADVIL,MOTRIN) 800 MG tablet Take 800 mg by mouth daily as needed for mild pain or moderate pain.  08/09/15  Yes Historical Provider, MD  latanoprost (XALATAN) 0.005 % ophthalmic solution Place 0.005 drops into both eyes at bedtime. 08/02/15  Yes Historical Provider, MD  lisinopril (PRINIVIL,ZESTRIL) 10 MG tablet Take 10 mg by mouth daily. 09/29/15  Yes Historical Provider, MD  OVER THE COUNTER MEDICATION Take 1 tablet by mouth daily. Liver support.   Yes Historical Provider, MD  levothyroxine (SYNTHROID, LEVOTHROID) 75 MCG tablet Take 1 tablet (75 mcg total) by mouth daily. 08/23/12   Renato Shin, MD  meloxicam (MOBIC) 15 MG tablet Take 1 tablet (15 mg total) by mouth daily. 09/29/15   Max T Hyatt, DPM   BP 139/75 mmHg  Pulse 70  Temp(Src) 98 F (36.7 C) (Oral)  Resp 14  Ht 5\' 3"  (1.6 m)  Wt 103.783 kg  BMI 40.54 kg/m2  SpO2 99% Physical Exam  Constitutional: She appears well-developed and well-nourished. No distress.  Awake, alert, nontoxic appearance  HENT:  Head: Normocephalic and atraumatic.  Mouth/Throat: Oropharynx is clear and moist. No oropharyngeal exudate.  Eyes: Conjunctivae are normal. No scleral icterus.  Neck: Normal range of motion. Neck supple.  Cardiovascular: Normal rate, regular rhythm, normal heart sounds and intact distal pulses.   No murmur heard. Pulmonary/Chest: Effort normal and breath sounds normal. No respiratory distress. She has no wheezes.  Equal chest expansion  Abdominal: Soft. Bowel sounds are normal. She exhibits no mass. There is no tenderness. There is no rebound and no guarding. Hernia confirmed negative in the right inguinal area and confirmed negative in the left inguinal area.  Genitourinary: Uterus normal. No labial fusion. There is no rash, tenderness or lesion on the right labia. There is no rash, tenderness  or lesion on the left labia. Uterus is not deviated, not enlarged, not fixed and not tender. Cervix exhibits no motion tenderness, no discharge and no friability. Right adnexum displays no mass, no tenderness and no fullness. Left adnexum displays no mass, no tenderness and no fullness. There is bleeding in the vagina. No erythema or tenderness in the vagina. No foreign body around the vagina. No signs of injury around the vagina. No vaginal discharge found.  Bright red blood in the vaginal vault, coming from the cervical os No lesions noted to the vaginal walls  Musculoskeletal: Normal range of motion. She exhibits no edema.  Lymphadenopathy:       Right: No inguinal adenopathy present.       Left: No inguinal adenopathy present.  Neurological: She is alert.  Speech is clear and goal oriented Moves extremities without ataxia  Skin: Skin is warm and dry. She is not diaphoretic. No erythema.  Psychiatric: She has a normal mood and affect.  Nursing note and vitals reviewed.   ED Course  Procedures (including critical care time) Labs Review Labs Reviewed  WET PREP, GENITAL - Abnormal; Notable for the following:    WBC, Wet Prep HPF POC FEW (*)    All other components within normal limits  URINALYSIS, ROUTINE W REFLEX MICROSCOPIC (NOT AT [facility]) -  Abnormal; Notable for the following:    Hgb urine dipstick LARGE (*)    All other components within normal limits  URINE MICROSCOPIC-ADD ON - Abnormal; Notable for the following:    Squamous Epithelial / LPF 0-5 (*)    Bacteria, UA RARE (*)    All other components within normal limits  HCG, QUANTITATIVE, PREGNANCY  CBC WITH DIFFERENTIAL/PLATELET  GC/CHLAMYDIA PROBE AMP ([place]) NOT AT [facility]    Imaging Review US Transvaginal Non-ob  10/21/2015  CLINICAL DATA:  Abnormal vaginal bleeding. Pelvic pain. Postmenopausal. EXAM: TRANSABDOMINAL AND TRANSVAGINAL ULTRASOUND OF PELVIS DOPPLER ULTRASOUND OF OVARIES TECHNIQUE: Both transabdominal and  transvaginal ultrasound examinations of the pelvis were performed. Transabdominal technique was performed for global imaging of the pelvis including uterus, ovaries, adnexal regions, and pelvic cul-de-sac. It was necessary to proceed with endovaginal exam following the transabdominal exam to visualize the uterus, endometrium, and ovaries. Color and duplex Doppler ultrasound was utilized to evaluate blood flow to the ovaries. COMPARISON:  None. FINDINGS: Uterus Measurements: 9.8 x 4.5 x 5.0 cm. No fibroids or other mass visualized. Endometrium Thickness: 4.7 cm. Heterogeneous material is identified throughout the endometrial canal from the fundus to the cervix. Material appears avascular on Doppler evaluation. Right ovary Measurements: The ovary is not visualized, either absent or obscured . No adnexal mass identified. Left ovary Measurements: 2.5 x 1.9 x 2.2 cm. Normal appearance/no adnexal mass. Pulsed Doppler evaluation of the left ovary demonstrates normal low-resistance arterial and venous waveforms. Other findings No free fluid. IMPRESSION: 1. Heterogeneous material throughout the endometrial canal, measuring up to 4.7 cm and suspicious for endometrial carcinoma. Alternatively the findings could be related to cervical stenosis and debris or clot within the endometrial canal. 2. No evidence for ovarian torsion. 3. Nonvisualized right ovary. Electronically Signed   By: Nolon Nations M.D.   On: 10/21/2015 18:31   US Pelvis Complete  10/21/2015  CLINICAL DATA:  Abnormal vaginal bleeding. Pelvic pain. Postmenopausal. EXAM: TRANSABDOMINAL AND TRANSVAGINAL ULTRASOUND OF PELVIS DOPPLER ULTRASOUND OF OVARIES TECHNIQUE: Both transabdominal and transvaginal ultrasound examinations of the pelvis were performed. Transabdominal technique was performed for global imaging of the pelvis including uterus, ovaries, adnexal regions, and pelvic cul-de-sac. It was necessary to proceed with endovaginal exam following the  transabdominal exam to visualize the uterus, endometrium, and ovaries. Color and duplex Doppler ultrasound was utilized to evaluate blood flow to the ovaries. COMPARISON:  None. FINDINGS: Uterus Measurements: 9.8 x 4.5 x 5.0 cm. No fibroids or other mass visualized. Endometrium Thickness: 4.7 cm. Heterogeneous material is identified throughout the endometrial canal from the fundus to the cervix. Material appears avascular on Doppler evaluation. Right ovary Measurements: The ovary is not visualized, either absent or obscured . No adnexal mass identified. Left ovary Measurements: 2.5 x 1.9 x 2.2 cm. Normal appearance/no adnexal mass. Pulsed Doppler evaluation of the left ovary demonstrates normal low-resistance arterial and venous waveforms. Other findings No free fluid. IMPRESSION: 1. Heterogeneous material throughout the endometrial canal, measuring up to 4.7 cm and suspicious for endometrial carcinoma. Alternatively the findings could be related to cervical stenosis and debris or clot within the endometrial canal. 2. No evidence for ovarian torsion. 3. Nonvisualized right ovary. Electronically Signed   By: Nolon Nations M.D.   On: 10/21/2015 18:31   [place] Art/ven Flow Abd Pelv Doppler  10/21/2015  CLINICAL DATA:  Abnormal vaginal bleeding. Pelvic pain. Postmenopausal. EXAM: TRANSABDOMINAL AND TRANSVAGINAL ULTRASOUND OF PELVIS DOPPLER ULTRASOUND OF OVARIES TECHNIQUE: Both transabdominal and transvaginal ultrasound examinations of  the pelvis were performed. Transabdominal technique was performed for global imaging of the pelvis including uterus, ovaries, adnexal regions, and pelvic cul-de-sac. It was necessary to proceed with endovaginal exam following the transabdominal exam to visualize the uterus, endometrium, and ovaries. Color and duplex Doppler ultrasound was utilized to evaluate blood flow to the ovaries. COMPARISON:  None. FINDINGS: Uterus Measurements: 9.8 x 4.5 x 5.0 cm. No fibroids or other mass  visualized. Endometrium Thickness: 4.7 cm. Heterogeneous material is identified throughout the endometrial canal from the fundus to the cervix. Material appears avascular on Doppler evaluation. Right ovary Measurements: The ovary is not visualized, either absent or obscured . No adnexal mass identified. Left ovary Measurements: 2.5 x 1.9 x 2.2 cm. Normal appearance/no adnexal mass. Pulsed Doppler evaluation of the left ovary demonstrates normal low-resistance arterial and venous waveforms. Other findings No free fluid. IMPRESSION: 1. Heterogeneous material throughout the endometrial canal, measuring up to 4.7 cm and suspicious for endometrial carcinoma. Alternatively the findings could be related to cervical stenosis and debris or clot within the endometrial canal. 2. No evidence for ovarian torsion. 3. Nonvisualized right ovary. Electronically Signed   By: Nolon Nations M.D.   On: 10/21/2015 18:31   I have personally reviewed and evaluated these images and lab results as part of my medical decision-making.   EKG Interpretation None      MDM   Final diagnoses:  Abnormal vaginal bleeding  Pelvic pain in female  Post-menopausal bleeding  Endometrial carcinoma (HCC)   Kayla Marshall presents with postmenopausal vaginal bleeding concerning for endometrial cancer.  Abd exam is benign.  Labs are reassuring and patient is without anemia. Pelvic ultrasound confirms concern for endometrial carcinoma.  Patient is without lightheadedness, shortness of breath, diaphoresis. Discussed findings with patient and recommend follow-up with OB/GYN ASAP for endometrial biopsy. She understands that we have concern for endometrial cancer and that follow-up is imperative.  BP 139/75 mmHg  Pulse 70  Temp(Src) 98 F (36.7 C) (Oral)  Resp 14  Ht 5\' 3"  (1.6 m)  Wt 103.783 kg  BMI 40.54 kg/m2  SpO2 99%    Abigail Butts, PA-C 10/21/15 1915  Tanna Furry, MD 11/03/15 [phone]

## 2015-10-22 ENCOUNTER — Other Ambulatory Visit: Payer: Self-pay | Admitting: Obstetrics and Gynecology

## 2015-10-22 ENCOUNTER — Emergency Department (HOSPITAL_COMMUNITY)
Admission: EM | Admit: 2015-10-22 | Discharge: 2015-10-22 | Disposition: A | Discharge status: Home / Self Care | Payer: 59 | Attending: Emergency Medicine | Admitting: Emergency Medicine

## 2015-10-22 ENCOUNTER — Other Ambulatory Visit: Payer: 59 | Admitting: Obstetrics & Gynecology

## 2015-10-22 ENCOUNTER — Other Ambulatory Visit (HOSPITAL_COMMUNITY)
Admission: RE | Admit: 2015-10-22 | Discharge: 2015-10-22 | Disposition: A | Discharge status: Home / Self Care | Payer: 59 | Source: Ambulatory Visit | Attending: Obstetrics and Gynecology | Admitting: Obstetrics and Gynecology

## 2015-10-22 ENCOUNTER — Telehealth: Payer: Self-pay | Admitting: *Deleted

## 2015-10-22 ENCOUNTER — Encounter (HOSPITAL_COMMUNITY): Payer: Self-pay | Admitting: Emergency Medicine

## 2015-10-22 DIAGNOSIS — Z79899 Other long term (current) drug therapy: Secondary | ICD-10-CM | POA: Diagnosis not present

## 2015-10-22 DIAGNOSIS — I10 Essential (primary) hypertension: Secondary | ICD-10-CM | POA: Diagnosis not present

## 2015-10-22 DIAGNOSIS — N939 Abnormal uterine and vaginal bleeding, unspecified: Secondary | ICD-10-CM

## 2015-10-22 DIAGNOSIS — H409 Unspecified glaucoma: Secondary | ICD-10-CM | POA: Diagnosis not present

## 2015-10-22 DIAGNOSIS — N938 Other specified abnormal uterine and vaginal bleeding: Secondary | ICD-10-CM | POA: Diagnosis not present

## 2015-10-22 DIAGNOSIS — F419 Anxiety disorder, unspecified: Secondary | ICD-10-CM | POA: Insufficient documentation

## 2015-10-22 DIAGNOSIS — Z1151 Encounter for screening for human papillomavirus (HPV): Secondary | ICD-10-CM | POA: Diagnosis not present

## 2015-10-22 DIAGNOSIS — Z791 Long term (current) use of non-steroidal anti-inflammatories (NSAID): Secondary | ICD-10-CM | POA: Diagnosis not present

## 2015-10-22 DIAGNOSIS — Z01411 Encounter for gynecological examination (general) (routine) with abnormal findings: Secondary | ICD-10-CM | POA: Diagnosis present

## 2015-10-22 HISTORY — DX: Sleep apnea, unspecified: G47.30

## 2015-10-22 HISTORY — DX: Unspecified glaucoma: H40.9

## 2015-10-22 HISTORY — DX: Anxiety disorder, unspecified: F41.9

## 2015-10-22 LAB — GC/CHLAMYDIA PROBE AMP (~~LOC~~) NOT AT ARMC
Chlamydia: NEGATIVE
Neisseria Gonorrhea: NEGATIVE

## 2015-10-22 LAB — CBC WITH DIFFERENTIAL/PLATELET
Basophils Absolute: 0 K/uL (ref 0.0–0.1)
Basophils Relative: 0 %
Eosinophils Absolute: 0.1 K/uL (ref 0.0–0.7)
Eosinophils Relative: 1 %
HCT: 40.6 % (ref 36.0–46.0)
Hemoglobin: 13.2 g/dL (ref 12.0–15.0)
Lymphocytes Relative: 15 %
Lymphs Abs: 1.3 K/uL (ref 0.7–4.0)
MCH: 30.5 pg (ref 26.0–34.0)
MCHC: 32.5 g/dL (ref 30.0–36.0)
MCV: 93.8 fL (ref 78.0–100.0)
Monocytes Absolute: 0.6 K/uL (ref 0.1–1.0)
Monocytes Relative: 7 %
Neutro Abs: 6.9 K/uL (ref 1.7–7.7)
Neutrophils Relative %: 77 %
Platelets: 294 K/uL (ref 150–400)
RBC: 4.33 MIL/uL (ref 3.87–5.11)
RDW: 13.8 % (ref 11.5–15.5)
WBC: 8.8 K/uL (ref 4.0–10.5)

## 2015-10-22 MED ORDER — METHYLPREDNISOLONE 4 MG PO TBPK: ORAL_TABLET | ORAL | Status: DC

## 2015-10-22 MED ORDER — OXYCODONE-ACETAMINOPHEN 5-325 MG PO TABS
1.0000 | ORAL_TABLET | Freq: Once | ORAL | Status: AC
  Administered 2015-10-22: 1 via ORAL
  Filled 2015-10-22: qty 1

## 2015-10-22 MED ORDER — OXYCODONE-ACETAMINOPHEN 5-325 MG PO TABS: 2.0000 | ORAL_TABLET | ORAL | Status: DC | PRN

## 2015-10-22 NOTE — ED Notes (Signed)
Pt started having vaginal bleeding yesterday- started light. Today bleeding has been very heavy. BP 140/78, HR 96. Pt states she is having clots.

## 2015-10-22 NOTE — Telephone Encounter (Signed)
Left message to call back  

## 2015-10-22 NOTE — ED Provider Notes (Signed)
CSN: [id number]     Arrival date & time 10/22/15  0913 History   First MD Initiated Contact with Patient 10/22/15 [phone]     Chief Complaint  Patient presents with  . Vaginal Bleeding     (Consider location/radiation/quality/duration/timing/severity/associated sxs/prior Treatment) HPI   Kayla Marshall is a 64 y.o F with no significant past medical history who presents to the emergency department today complaining of vaginal bleeding. Patient was seen and evaluated in the emergency department yesterday for the same issue. At that time patient had a transvaginal ultrasound performed which showed findings concerning for endometrial carcinoma. Patient states that she was scheduled for a follow-up appointment with her OB/GYN this morning at 9:15 however patient states that her vaginal bleeding increased prior to this. Patient called her primary care provider who advised her to go to the [facility] however patient presented to [facility] instead. Patient states that she is afraid she is going to bleed to death. Patient states that she went through 1 pad in less than an hour and became concerned. Patient is requests endometrial biopsy to be performed in the ED. Denies increased abdominal pain, dysuria, fever, shortness of breath, increased fatigue, pallor, vomiting, vaginal discharge.  Past Medical History  Diagnosis Date  . Hypertension   . Sciatica   . Glaucoma   . Anxiety   . Sleep apnea    Past Surgical History  Procedure Laterality Date  . Cholecystectomy  1990   History reviewed. No pertinent family history. Social History  Substance Use Topics  . Smoking status: Never Smoker   . Smokeless tobacco: None  . Alcohol Use: Yes   OB History    No data available     Review of Systems  All other systems reviewed and are negative.     Allergies  Erythromycin; Meloxicam; and Moxifloxacin  Home Medications   Prior to Admission medications   Medication Sig Start Date End Date  Taking? Authorizing Provider  ALPRAZolam (XANAX) 0.25 MG tablet Take 0.25 mg by mouth 2 (two) times daily as needed for anxiety or sleep.  08/02/15  Yes Historical Provider, MD  Beta Glucan POWD 1 tablet by Does not apply route daily.   Yes Historical Provider, MD  brimonidine (ALPHAGAN P) 0.1 % SOLN Place 1 drop into both eyes 2 (two) times daily.   Yes Historical Provider, MD  CALCIUM PO Take 2 tablets by mouth at bedtime and may repeat dose one time if needed. With magnesium.   Yes Historical Provider, MD  ibuprofen (ADVIL,MOTRIN) 800 MG tablet Take 1 tablet (800 mg total) by mouth 3 (three) times daily. 10/21/15  Yes Tanna Furry, MD  latanoprost (XALATAN) 0.005 % ophthalmic solution Place 0.005 drops into both eyes at bedtime. 08/02/15  Yes Historical Provider, MD  lisinopril (PRINIVIL,ZESTRIL) 10 MG tablet Take 10 mg by mouth daily. 09/29/15  Yes Historical Provider, MD  OVER THE COUNTER MEDICATION Take 1 tablet by mouth daily. Liver support.   Yes Historical Provider, MD  levothyroxine (SYNTHROID, LEVOTHROID) 75 MCG tablet Take 1 tablet (75 mcg total) by mouth daily. 08/23/12   Renato Shin, MD  meloxicam (MOBIC) 15 MG tablet Take 1 tablet (15 mg total) by mouth daily. 09/29/15   Max T Hyatt, DPM  methylPREDNISolone (MEDROL DOSEPAK) 4 MG TBPK tablet Dispense one pack, with directions, to take as a tapering dose. 10/22/15   Max T Hyatt, DPM  oxyCODONE-acetaminophen (PERCOCET/ROXICET) 5-325 MG tablet Take 2 tablets by mouth every 4 (four) hours as  needed for severe pain. 10/22/15   Samantha Tripp [place], PA-C   BP 131/66 mmHg  Pulse 71  Temp(Src) 98.1 F (36.7 C) (Oral)  Resp 17  Ht 5\' 3"  (1.6 m)  Wt 102.059 kg  BMI 39.87 kg/m2  SpO2 97% Physical Exam  Constitutional: She is oriented to person, place, and time. She appears well-developed and well-nourished. No distress.  HENT:  Head: Normocephalic and atraumatic.  Mouth/Throat: Oropharynx is clear and moist. No oropharyngeal exudate.  Eyes:  Conjunctivae and EOM are normal. Pupils are equal, round, and reactive to light. Right eye exhibits no discharge. Left eye exhibits no discharge. No scleral icterus.  Neck: Neck supple.  Cardiovascular: Normal rate, regular rhythm, normal heart sounds and intact distal pulses.  Exam reveals no gallop and no friction rub.   No murmur heard. Pulmonary/Chest: Effort normal and breath sounds normal. No respiratory distress. She has no wheezes. She has no rales. She exhibits no tenderness.  Abdominal: Soft. Bowel sounds are normal. She exhibits no distension. There is tenderness ( Mild suprapubic tenderness to palpation.). There is no guarding.  Musculoskeletal: Normal range of motion. She exhibits no edema.  Lymphadenopathy:    She has no cervical adenopathy.  Neurological: She is alert and oriented to person, place, and time. No cranial nerve deficit.  Strength 5/5 throughout. No sensory deficits.    Skin: Skin is warm and dry. No rash noted. She is not diaphoretic. No erythema. No pallor.  Psychiatric: She has a normal mood and affect. Her behavior is normal.  Patient very anxious and tearful on exam.  Nursing note and vitals reviewed.   ED Course  Procedures (including critical care time) Labs Review Labs Reviewed  CBC WITH DIFFERENTIAL/PLATELET    Imaging Review US Transvaginal Non-ob  10/21/2015  CLINICAL DATA:  Abnormal vaginal bleeding. Pelvic pain. Postmenopausal. EXAM: TRANSABDOMINAL AND TRANSVAGINAL ULTRASOUND OF PELVIS DOPPLER ULTRASOUND OF OVARIES TECHNIQUE: Both transabdominal and transvaginal ultrasound examinations of the pelvis were performed. Transabdominal technique was performed for global imaging of the pelvis including uterus, ovaries, adnexal regions, and pelvic cul-de-sac. It was necessary to proceed with endovaginal exam following the transabdominal exam to visualize the uterus, endometrium, and ovaries. Color and duplex Doppler ultrasound was utilized to evaluate blood  flow to the ovaries. COMPARISON:  None. FINDINGS: Uterus Measurements: 9.8 x 4.5 x 5.0 cm. No fibroids or other mass visualized. Endometrium Thickness: 4.7 cm. Heterogeneous material is identified throughout the endometrial canal from the fundus to the cervix. Material appears avascular on Doppler evaluation. Right ovary Measurements: The ovary is not visualized, either absent or obscured . No adnexal mass identified. Left ovary Measurements: 2.5 x 1.9 x 2.2 cm. Normal appearance/no adnexal mass. Pulsed Doppler evaluation of the left ovary demonstrates normal low-resistance arterial and venous waveforms. Other findings No free fluid. IMPRESSION: 1. Heterogeneous material throughout the endometrial canal, measuring up to 4.7 cm and suspicious for endometrial carcinoma. Alternatively the findings could be related to cervical stenosis and debris or clot within the endometrial canal. 2. No evidence for ovarian torsion. 3. Nonvisualized right ovary. Electronically Signed   By: Nolon Nations M.D.   On: 10/21/2015 18:31   US Pelvis Complete  10/21/2015  CLINICAL DATA:  Abnormal vaginal bleeding. Pelvic pain. Postmenopausal. EXAM: TRANSABDOMINAL AND TRANSVAGINAL ULTRASOUND OF PELVIS DOPPLER ULTRASOUND OF OVARIES TECHNIQUE: Both transabdominal and transvaginal ultrasound examinations of the pelvis were performed. Transabdominal technique was performed for global imaging of the pelvis including uterus, ovaries, adnexal regions, and pelvic cul-de-sac. It  was necessary to proceed with endovaginal exam following the transabdominal exam to visualize the uterus, endometrium, and ovaries. Color and duplex Doppler ultrasound was utilized to evaluate blood flow to the ovaries. COMPARISON:  None. FINDINGS: Uterus Measurements: 9.8 x 4.5 x 5.0 cm. No fibroids or other mass visualized. Endometrium Thickness: 4.7 cm. Heterogeneous material is identified throughout the endometrial canal from the fundus to the cervix. Material  appears avascular on Doppler evaluation. Right ovary Measurements: The ovary is not visualized, either absent or obscured . No adnexal mass identified. Left ovary Measurements: 2.5 x 1.9 x 2.2 cm. Normal appearance/no adnexal mass. Pulsed Doppler evaluation of the left ovary demonstrates normal low-resistance arterial and venous waveforms. Other findings No free fluid. IMPRESSION: 1. Heterogeneous material throughout the endometrial canal, measuring up to 4.7 cm and suspicious for endometrial carcinoma. Alternatively the findings could be related to cervical stenosis and debris or clot within the endometrial canal. 2. No evidence for ovarian torsion. 3. Nonvisualized right ovary. Electronically Signed   By: Nolon Nations M.D.   On: 10/21/2015 18:31   [place] Art/ven Flow Abd Pelv Doppler  10/21/2015  CLINICAL DATA:  Abnormal vaginal bleeding. Pelvic pain. Postmenopausal. EXAM: TRANSABDOMINAL AND TRANSVAGINAL ULTRASOUND OF PELVIS DOPPLER ULTRASOUND OF OVARIES TECHNIQUE: Both transabdominal and transvaginal ultrasound examinations of the pelvis were performed. Transabdominal technique was performed for global imaging of the pelvis including uterus, ovaries, adnexal regions, and pelvic cul-de-sac. It was necessary to proceed with endovaginal exam following the transabdominal exam to visualize the uterus, endometrium, and ovaries. Color and duplex Doppler ultrasound was utilized to evaluate blood flow to the ovaries. COMPARISON:  None. FINDINGS: Uterus Measurements: 9.8 x 4.5 x 5.0 cm. No fibroids or other mass visualized. Endometrium Thickness: 4.7 cm. Heterogeneous material is identified throughout the endometrial canal from the fundus to the cervix. Material appears avascular on Doppler evaluation. Right ovary Measurements: The ovary is not visualized, either absent or obscured . No adnexal mass identified. Left ovary Measurements: 2.5 x 1.9 x 2.2 cm. Normal appearance/no adnexal mass. Pulsed Doppler evaluation of  the left ovary demonstrates normal low-resistance arterial and venous waveforms. Other findings No free fluid. IMPRESSION: 1. Heterogeneous material throughout the endometrial canal, measuring up to 4.7 cm and suspicious for endometrial carcinoma. Alternatively the findings could be related to cervical stenosis and debris or clot within the endometrial canal. 2. No evidence for ovarian torsion. 3. Nonvisualized right ovary. Electronically Signed   By: Nolon Nations M.D.   On: 10/21/2015 18:31   I have personally reviewed and evaluated these images and lab results as part of my medical decision-making.   EKG Interpretation None      MDM   Final diagnoses:  Vaginal bleeding    64 year old female, postmenopausal presents for vaginal bleeding.  Patient evaluated for same symptoms in ED yesterday. Transvaginal ultrasound performed which showed findings concerning for endometrial carcinoma. Patient states vaginal bleeding increased today. Patient is concerned she may be bleeding to death.Patient overall appears well, nontoxic. However patient is very anxious and tearful on exam. Vital signs are stable. We will repeat CBC to check hemoglobin level.  Hemoglobin is 13.2. Reassured patient that her blood counts are within normal limits and her vaginal bleeding is not concerning for emergent hemorrhage. Given patient's recent findings on ultrasound she is expected to have moderate vaginal bleeding. Discussed with patient that endometrial biopsies are typically performed an outpatient basis and will not be done in the emergency department today. Patient may follow up with her  OB/GYN for this endometrial biopsy. Patient understands urgency at which this needs to be completed and will make an appointment as soon as possible. Pain managed in ED. Discussed treatment plan with patient who is agreeable. Return precautions outlined in patient discharge instructions.  Patient was discussed with and seen by Dr.  Regenia Skeeter who agrees with the treatment plan.      Dondra Spry [place], PA-C 10/22/15 1722  Sherwood Gambler, MD 10/25/15 [phone]

## 2015-10-22 NOTE — Telephone Encounter (Signed)
Patient returned call, informed her of TP's recs. Patient voiced understanding and had no further questions. Scheduled patient for ov with RA on 02/09/15 at 2:15. Nothing further needed.

## 2015-10-22 NOTE — Telephone Encounter (Signed)
Pt states she had initially refused the steroid pack, Dr. Milinda Pointer had offered, but would like it now.  I left message informing pt I would call in the Medrol dose pack as prescribed by Dr. Milinda Pointer to the [place].

## 2015-10-22 NOTE — Discharge Instructions (Signed)
Abnormal Uterine Bleeding Abnormal uterine bleeding can affect women at various stages in life, including teenagers, women in their reproductive years, pregnant women, and women who have reached menopause. Several kinds of uterine bleeding are considered abnormal, including:  Bleeding or spotting between periods.   Bleeding after sexual intercourse.   Bleeding that is heavier or more than normal.   Periods that last longer than usual.  Bleeding after menopause.  Many cases of abnormal uterine bleeding are minor and simple to treat, while others are more serious. Any type of abnormal bleeding should be evaluated by your health care provider. Treatment will depend on the cause of the bleeding. HOME CARE INSTRUCTIONS Monitor your condition for any changes. The following actions may help to alleviate any discomfort you are experiencing:  Avoid the use of tampons and douches as directed by your health care provider.  Change your pads frequently. You should get regular pelvic exams and Pap tests. Keep all follow-up appointments for diagnostic tests as directed by your health care provider.  SEEK MEDICAL CARE IF:   Your bleeding lasts more than 1 week.   You feel dizzy at times.  SEEK IMMEDIATE MEDICAL CARE IF:   You pass out.   You are changing pads every 15 to 30 minutes.   You have abdominal pain.  You have a fever.   You become sweaty or weak.   You are passing large blood clots from the vagina.   You start to feel nauseous and vomit. MAKE SURE YOU:   Understand these instructions.  Will watch your condition.  Will get help right away if you are not doing well or get worse.   This information is not intended to replace advice given to you by your health care provider. Make sure you discuss any questions you have with your health care provider.   Document Released: 11/06/2005 Document Revised: 11/11/2013 Document Reviewed: 06/05/2013 Elsevier Interactive  Patient Education 2016 [place].  Endometrial Biopsy Endometrial biopsy is a procedure in which a tissue sample is taken from inside the uterus. The tissue sample is then looked at under a microscope to see if the tissue is normal or abnormal. The endometrium is the lining of the uterus. This procedure helps determine where you are in your menstrual cycle and how hormone levels are affecting the lining of the uterus. This procedure may also be used to evaluate uterine bleeding or to diagnose endometrial cancer, tuberculosis, polyps, or inflammatory conditions.  LET [facility] CARE PROVIDER KNOW ABOUT:  Any allergies you have.  All medicines you are taking, including vitamins, herbs, eye drops, creams, and over-the-counter medicines.  Previous problems you or members of your family have had with the use of anesthetics.  Any blood disorders you have.  Previous surgeries you have had.  Medical conditions you have.  Possibility of pregnancy. RISKS AND COMPLICATIONS Generally, this is a safe procedure. However, as with any procedure, complications can occur. Possible complications include:  Bleeding.  Pelvic infection.  Puncture of the uterine wall with the biopsy device (rare). BEFORE THE PROCEDURE   Keep a record of your menstrual cycles as directed by your health care provider. You may need to schedule your procedure for a specific time in your cycle.  You may want to bring a sanitary pad to wear home after the procedure.  Arrange for someone to drive you home after the procedure if you will be given a medicine to help you relax (sedative). PROCEDURE   You may  be given a sedative to relax you.  You will lie on an exam table with your feet and legs supported as in a pelvic exam.  Your health care provider will insert an instrument (speculum) into your vagina to see your cervix.  Your cervix will be cleansed with an antiseptic solution. A medicine (local anesthetic) will  be used to numb the cervix.  A forceps instrument (tenaculum) will be used to hold your cervix steady for the biopsy.  A thin, rodlike instrument (uterine sound) will be inserted through your cervix to determine the length of your uterus and the location where the biopsy sample will be removed.  A thin, flexible tube (catheter) will be inserted through your cervix and into the uterus. The catheter is used to collect the biopsy sample from your endometrial tissue.  The catheter and speculum will then be removed, and the tissue sample will be sent to a lab for examination. AFTER THE PROCEDURE  You will rest in a recovery area until you are ready to go home.  You may have mild cramping and a small amount of vaginal bleeding for a few days after the procedure. This is normal.  Make sure you find out how to get your test results.   This information is not intended to replace advice given to you by your health care provider. Make sure you discuss any questions you have with your health care provider.   FOLLOW UP WITH OBGYN AS SOON AS POSSIBLE FOR ENDOMETRIAL BIOPSY. RETURN TO THE EMERGENCY DEPARTMENT IF YOU EXPERIENCE LOSS OF CONSCIOUSNESS, DIFFICULTY BREATHING, DIZZINESS, SEVERE INCREASE IN PAIN.

## 2015-10-25 ENCOUNTER — Other Ambulatory Visit: Payer: 59 | Admitting: Family Medicine

## 2015-10-25 DIAGNOSIS — R87619 Unspecified abnormal cytological findings in specimens from cervix uteri: Secondary | ICD-10-CM

## 2015-10-26 LAB — CYTOLOGY - PAP

## 2015-10-27 ENCOUNTER — Ambulatory Visit: Payer: 59 | Admitting: Podiatry

## 2015-10-28 ENCOUNTER — Other Ambulatory Visit: Payer: 59 | Admitting: Obstetrics & Gynecology

## 2015-11-01 ENCOUNTER — Encounter: Payer: 59 | Admitting: Obstetrics & Gynecology

## 2015-11-03 ENCOUNTER — Encounter: Payer: Self-pay | Admitting: Adult Health

## 2015-11-05 ENCOUNTER — Other Ambulatory Visit: Payer: Self-pay

## 2015-11-05 ENCOUNTER — Encounter (HOSPITAL_COMMUNITY)
Admission: RE | Admit: 2015-11-05 | Discharge: 2015-11-05 | Disposition: A | Discharge status: Home / Self Care | Payer: 59 | Source: Ambulatory Visit | Attending: Obstetrics and Gynecology | Admitting: Obstetrics and Gynecology

## 2015-11-05 ENCOUNTER — Encounter (HOSPITAL_COMMUNITY): Payer: Self-pay

## 2015-11-05 DIAGNOSIS — N950 Postmenopausal bleeding: Secondary | ICD-10-CM | POA: Diagnosis not present

## 2015-11-05 DIAGNOSIS — Z01812 Encounter for preprocedural laboratory examination: Secondary | ICD-10-CM | POA: Diagnosis not present

## 2015-11-05 DIAGNOSIS — Z01810 Encounter for preprocedural cardiovascular examination: Secondary | ICD-10-CM | POA: Diagnosis present

## 2015-11-05 HISTORY — DX: Hypothyroidism, unspecified: E03.9

## 2015-11-05 HISTORY — DX: Unspecified osteoarthritis, unspecified site: M19.90

## 2015-11-05 HISTORY — DX: Reserved for inherently not codable concepts without codable children: IMO0001

## 2015-11-05 HISTORY — DX: Obesity, unspecified: E66.9

## 2015-11-05 HISTORY — DX: Gastro-esophageal reflux disease without esophagitis: K21.9

## 2015-11-05 HISTORY — DX: Sciatica, unspecified side: M54.30

## 2015-11-05 LAB — BASIC METABOLIC PANEL
Anion gap: 11 (ref 5–15)
BUN: 17 mg/dL (ref 6–20)
CALCIUM: 9.2 mg/dL (ref 8.9–10.3)
CO2: 25 mmol/L (ref 22–32)
CREATININE: 0.68 mg/dL (ref 0.44–1.00)
Chloride: 104 mmol/L (ref 101–111)
Glucose, Bld: 120 mg/dL — ABNORMAL HIGH (ref 65–99)
Potassium: 4 mmol/L (ref 3.5–5.1)
SODIUM: 140 mmol/L (ref 135–145)

## 2015-11-05 LAB — CBC
HCT: 40.2 % (ref 36.0–46.0)
HEMOGLOBIN: 13.1 g/dL (ref 12.0–15.0)
MCH: 30.7 pg (ref 26.0–34.0)
MCHC: 32.6 g/dL (ref 30.0–36.0)
MCV: 94.1 fL (ref 78.0–100.0)
PLATELETS: 295 10*3/uL (ref 150–400)
RBC: 4.27 MIL/uL (ref 3.87–5.11)
RDW: 14.5 % (ref 11.5–15.5)
WBC: 8.5 10*3/uL (ref 4.0–10.5)

## 2015-11-05 NOTE — Patient Instructions (Addendum)
Your procedure is scheduled on:  Friday, Dec. 23, 2016  Enter through the [facility] [facility] at:  7:30 A.M.  Pick up the phone at the desk and dial [phone].  Call this number if you have problems the morning of surgery: [phone].  Remember: Do NOT eat food or drink after: Midnight Thursday, Dec. 22, 2016 Take these medicines the morning of surgery with a SIP OF WATER:  Lisinopril, Xanax if needed   Do NOT wear jewelry (body piercing), metal hair clips/bobby pins, make-up, or nail polish. Do NOT wear lotions, powders, or perfumes.  You may wear deoderant. Do NOT shave for 48 hours prior to surgery. Do NOT bring valuables to the hospital. Contacts, dentures, or bridgework may not be worn into surgery.  Have a responsible adult drive you home and stay with you for 24 hours after your procedure  Bring CPAP machine day of surgery

## 2015-11-11 NOTE — Anesthesia Preprocedure Evaluation (Addendum)
Anesthesia Evaluation  Patient identified by MRN, date of birth, ID band Patient awake    Reviewed: Allergy & Precautions, NPO status , Patient's Chart, lab work & pertinent test results  History of Anesthesia Complications Negative for: history of anesthetic complications  Airway Mallampati: III  TM Distance: >3 FB Neck ROM: Full  Mouth opening: Limited Mouth Opening  Dental no notable dental hx. (+) Dental Advisory Given, Chipped,    Pulmonary sleep apnea ,    Pulmonary exam normal breath sounds clear to auscultation       Cardiovascular hypertension, Pt. on medications Normal cardiovascular exam Rhythm:Regular Rate:Normal     Neuro/Psych PSYCHIATRIC DISORDERS Anxiety negative neurological ROS     GI/Hepatic Neg liver ROS, GERD  Medicated and Controlled,  Endo/Other  Hypothyroidism Morbid obesity  Renal/GU negative Renal ROS  negative genitourinary   Musculoskeletal  (+) Arthritis ,   Abdominal   Peds negative pediatric ROS (+)  Hematology negative hematology ROS (+)   Anesthesia Other Findings   Reproductive/Obstetrics negative OB ROS                           Anesthesia Physical Anesthesia Plan  ASA: III  Anesthesia Plan: General   Post-op Pain Management:    Induction: Intravenous  Airway Management Planned: LMA  Additional Equipment:   Intra-op Plan:   Post-operative Plan: Extubation in OR  Informed Consent: I have reviewed the patients History and Physical, chart, labs and discussed the procedure including the risks, benefits and alternatives for the proposed anesthesia with the patient or authorized representative who has indicated his/her understanding and acceptance.   Dental advisory given  Plan Discussed with: CRNA  Anesthesia Plan Comments:         Anesthesia Quick Evaluation

## 2015-11-12 ENCOUNTER — Encounter (HOSPITAL_COMMUNITY)
Admission: RE | Disposition: A | Discharge status: Home / Self Care | Payer: Self-pay | Source: Ambulatory Visit | Attending: Obstetrics and Gynecology

## 2015-11-12 ENCOUNTER — Ambulatory Visit (HOSPITAL_COMMUNITY): Payer: 59 | Admitting: Anesthesiology

## 2015-11-12 ENCOUNTER — Encounter (HOSPITAL_COMMUNITY): Payer: Self-pay | Admitting: Emergency Medicine

## 2015-11-12 ENCOUNTER — Ambulatory Visit (HOSPITAL_COMMUNITY)
Admission: RE | Admit: 2015-11-12 | Discharge: 2015-11-12 | Disposition: A | Discharge status: Home / Self Care | Payer: 59 | Source: Ambulatory Visit | Attending: Obstetrics and Gynecology | Admitting: Obstetrics and Gynecology

## 2015-11-12 DIAGNOSIS — E039 Hypothyroidism, unspecified: Secondary | ICD-10-CM | POA: Insufficient documentation

## 2015-11-12 DIAGNOSIS — Z6841 Body Mass Index (BMI) 40.0 and over, adult: Secondary | ICD-10-CM | POA: Insufficient documentation

## 2015-11-12 DIAGNOSIS — Z87891 Personal history of nicotine dependence: Secondary | ICD-10-CM | POA: Insufficient documentation

## 2015-11-12 DIAGNOSIS — G4730 Sleep apnea, unspecified: Secondary | ICD-10-CM | POA: Diagnosis not present

## 2015-11-12 DIAGNOSIS — N950 Postmenopausal bleeding: Secondary | ICD-10-CM | POA: Diagnosis present

## 2015-11-12 DIAGNOSIS — M4802 Spinal stenosis, cervical region: Secondary | ICD-10-CM | POA: Insufficient documentation

## 2015-11-12 DIAGNOSIS — I10 Essential (primary) hypertension: Secondary | ICD-10-CM | POA: Diagnosis not present

## 2015-11-12 HISTORY — PX: DILATATION & CURETTAGE/HYSTEROSCOPY WITH MYOSURE: SHX6511

## 2015-11-12 SURGERY — DILATATION & CURETTAGE/HYSTEROSCOPY WITH MYOSURE
Anesthesia: General | Site: Vagina

## 2015-11-12 MED ORDER — SCOPOLAMINE 1 MG/3DAYS TD PT72
MEDICATED_PATCH | TRANSDERMAL | Status: AC
  Filled 2015-11-12: qty 1

## 2015-11-12 MED ORDER — SODIUM CHLORIDE 0.9 % IJ SOLN
INTRAMUSCULAR | Status: AC
  Filled 2015-11-12: qty 3

## 2015-11-12 MED ORDER — EPHEDRINE 5 MG/ML INJ
INTRAVENOUS | Status: AC
  Filled 2015-11-12: qty 10

## 2015-11-12 MED ORDER — ONDANSETRON HCL 4 MG/2ML IJ SOLN: 4.0000 mg | Freq: Once | INTRAMUSCULAR | Status: DC | PRN

## 2015-11-12 MED ORDER — ONDANSETRON HCL 4 MG/2ML IJ SOLN
INTRAMUSCULAR | Status: AC
  Filled 2015-11-12: qty 2

## 2015-11-12 MED ORDER — PROPOFOL 10 MG/ML IV BOLUS
INTRAVENOUS | Status: DC | PRN
  Administered 2015-11-12: 140 mg via INTRAVENOUS

## 2015-11-12 MED ORDER — OXYCODONE-ACETAMINOPHEN 5-325 MG PO TABS: 2.0000 | ORAL_TABLET | ORAL | Status: DC | PRN

## 2015-11-12 MED ORDER — FENTANYL CITRATE (PF) 100 MCG/2ML IJ SOLN
INTRAMUSCULAR | Status: AC
  Filled 2015-11-12: qty 2

## 2015-11-12 MED ORDER — ONDANSETRON HCL 4 MG/2ML IJ SOLN
INTRAMUSCULAR | Status: DC | PRN
  Administered 2015-11-12: 4 mg via INTRAVENOUS

## 2015-11-12 MED ORDER — SCOPOLAMINE 1 MG/3DAYS TD PT72: 1.0000 | MEDICATED_PATCH | Freq: Once | TRANSDERMAL | Status: DC

## 2015-11-12 MED ORDER — EPHEDRINE SULFATE 50 MG/ML IJ SOLN
INTRAMUSCULAR | Status: DC | PRN
  Administered 2015-11-12 (×2): 5 mg via INTRAVENOUS

## 2015-11-12 MED ORDER — LIDOCAINE HCL (CARDIAC) 20 MG/ML IV SOLN
INTRAVENOUS | Status: DC | PRN
  Administered 2015-11-12: 60 mg via INTRAVENOUS

## 2015-11-12 MED ORDER — PROPOFOL 10 MG/ML IV BOLUS
INTRAVENOUS | Status: AC
  Filled 2015-11-12: qty 20

## 2015-11-12 MED ORDER — FENTANYL CITRATE (PF) 100 MCG/2ML IJ SOLN
INTRAMUSCULAR | Status: DC | PRN
  Administered 2015-11-12 (×2): 50 ug via INTRAVENOUS

## 2015-11-12 MED ORDER — LIDOCAINE HCL 2 % IJ SOLN
INTRAMUSCULAR | Status: DC | PRN
  Administered 2015-11-12: 10 mL

## 2015-11-12 MED ORDER — MIDAZOLAM HCL 2 MG/2ML IJ SOLN
INTRAMUSCULAR | Status: AC
  Filled 2015-11-12: qty 2

## 2015-11-12 MED ORDER — IBUPROFEN 600 MG PO TABS
600.0000 mg | ORAL_TABLET | Freq: Four times a day (QID) | ORAL | Status: DC | PRN
Start: 2015-11-12 — End: 2017-07-04

## 2015-11-12 MED ORDER — DEXAMETHASONE SODIUM PHOSPHATE 10 MG/ML IJ SOLN
INTRAMUSCULAR | Status: DC | PRN
  Administered 2015-11-12: 4 mg via INTRAVENOUS

## 2015-11-12 MED ORDER — LIDOCAINE HCL 2 % IJ SOLN
INTRAMUSCULAR | Status: AC
  Filled 2015-11-12: qty 20

## 2015-11-12 MED ORDER — LIDOCAINE HCL (CARDIAC) 20 MG/ML IV SOLN
INTRAVENOUS | Status: AC
  Filled 2015-11-12: qty 5

## 2015-11-12 MED ORDER — LACTATED RINGERS IV SOLN
INTRAVENOUS | Status: DC
  Administered 2015-11-12 (×2): via INTRAVENOUS

## 2015-11-12 MED ORDER — FENTANYL CITRATE (PF) 100 MCG/2ML IJ SOLN
25.0000 ug | INTRAMUSCULAR | Status: DC | PRN
  Administered 2015-11-12 (×2): 25 ug via INTRAVENOUS
  Administered 2015-11-12 (×2): 50 ug via INTRAVENOUS

## 2015-11-12 MED ORDER — DEXAMETHASONE SODIUM PHOSPHATE 4 MG/ML IJ SOLN
INTRAMUSCULAR | Status: AC
Start: 2015-11-12 — End: 2015-11-12
  Filled 2015-11-12: qty 1

## 2015-11-12 MED ORDER — SODIUM CHLORIDE 0.9 % IR SOLN
Status: DC | PRN
  Administered 2015-11-12: 3000 mL
  Administered 2015-11-12 (×2): 1000 mL

## 2015-11-12 MED ORDER — MIDAZOLAM HCL 5 MG/5ML IJ SOLN
INTRAMUSCULAR | Status: DC | PRN
  Administered 2015-11-12: 1 mg via INTRAVENOUS

## 2015-11-12 SURGICAL SUPPLY — 19 items
CANISTER SUCT 3000ML (MISCELLANEOUS) ×2 IMPLANT
CATH ROBINSON RED A/P 16FR (CATHETERS) ×2 IMPLANT
CLOTH BEACON ORANGE TIMEOUT ST (SAFETY) ×2 IMPLANT
CONTAINER PREFILL 10% NBF 60ML (FORM) ×4 IMPLANT
DEVICE MYOSURE LITE (MISCELLANEOUS) IMPLANT
DEVICE MYOSURE REACH (MISCELLANEOUS) IMPLANT
DILATOR CANAL MILEX (MISCELLANEOUS) IMPLANT
FILTER ARTHROSCOPY CONVERTOR (FILTER) ×2 IMPLANT
GLOVE BIO SURGEON STRL SZ7 (GLOVE) ×2 IMPLANT
GLOVE BIOGEL PI IND STRL 7.0 (GLOVE) ×2 IMPLANT
GLOVE BIOGEL PI INDICATOR 7.0 (GLOVE) ×2
GOWN STRL REUS W/TWL LRG LVL3 (GOWN DISPOSABLE) ×4 IMPLANT
PACK VAGINAL MINOR WOMEN LF (CUSTOM PROCEDURE TRAY) ×2 IMPLANT
PAD OB MATERNITY 4.3X12.25 (PERSONAL CARE ITEMS) ×2 IMPLANT
SEAL ROD LENS SCOPE MYOSURE (ABLATOR) ×2 IMPLANT
TOWEL OR 17X24 6PK STRL BLUE (TOWEL DISPOSABLE) ×4 IMPLANT
TUBING AQUILEX INFLOW (TUBING) ×2 IMPLANT
TUBING AQUILEX OUTFLOW (TUBING) ×2 IMPLANT
WATER STERILE IRR 1000ML POUR (IV SOLUTION) ×2 IMPLANT

## 2015-11-12 NOTE — Anesthesia Procedure Notes (Signed)
Procedure Name: LMA Insertion Date/Time: 11/12/2015 9:02 AM Performed by: Raenette Rover Pre-anesthesia Checklist: Patient identified, Emergency Drugs available, Suction available and Patient being monitored Patient Re-evaluated:Patient Re-evaluated prior to inductionOxygen Delivery Method: Circle system utilized Preoxygenation: Pre-oxygenation with 100% oxygen Intubation Type: IV induction LMA: LMA inserted LMA Size: 4.0 Number of attempts: 1 Placement Confirmation: positive ETCO2,  CO2 detector and breath sounds checked- equal and bilateral Tube secured with: Tape Dental Injury: Teeth and Oropharynx as per pre-operative assessment

## 2015-11-12 NOTE — Interval H&P Note (Signed)
History and Physical Interval Note:  11/12/2015 8:52 AM  Kayla Marshall  has presented today for surgery, with the diagnosis of N95.0  PMB  The various methods of treatment have been discussed with the patient and family. After consideration of risks, benefits and other options for treatment, the patient has consented to  Procedure(s): [place] (N/A) as a surgical intervention .  The patient's history has been reviewed, patient examined, no change in status, stable for surgery.  I have reviewed the patient's chart and labs.  Questions were answered to the patient's satisfaction.    Pt still with quarter sized bleeding.  Taking Provera daily.  Took Cytotec x 2 doses, last dose ~ 4hours ago.    Simona Huh, Larhonda Dettloff
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
## 2015-11-12 NOTE — Brief Op Note (Signed)
11/12/2015  9:43 AM  PATIENT:  Kayla Marshall  64 y.o. female  PRE-OPERATIVE DIAGNOSIS:  N95.0  PMB, cervical stenosis  POST-OPERATIVE DIAGNOSIS:  PMB, endometrial mass  PROCEDURE:  Procedure(s): DILATATION & CURETTAGE/HYSTEROSCOPY WITH  MYOSURE (N/A)  SURGEON:  Surgeon(s) and Role:    * Thurnell Lose, MD - Primary  PHYSICIAN ASSISTANT:   ASSISTANTS: Technician   ANESTHESIA:   general  EBL:  Total I/O In: 1200 [I.V.:1200] Out: 30 [Urine:25; Blood:5]  BLOOD ADMINISTERED:none  DRAINS: none   LOCAL MEDICATIONS USED:  2 % LIDOCAINE  and Amount: 10 ml  SPECIMEN:  Source of Specimen:  endometrial mass and currettings  DISPOSITION OF SPECIMEN:  PATHOLOGY  COUNTS:  YES  TOURNIQUET:  * No tourniquets in log *  DICTATION: .Other Dictation: Dictation Number [id number]    PLAN OF CARE: Discharge to home after PACU  PATIENT DISPOSITION:  PACU - hemodynamically stable.   Delay start of Pharmacological VTE agent (>24hrs) due to surgical blood loss or risk of bleeding: yes

## 2015-11-12 NOTE — Op Note (Signed)
NAMESTEPHANI, Kayla Marshall                 ACCOUNT NO.:  [id number]  MEDICAL RECORD NO.:  [id number]  LOCATION:  [facility]                          FACILITY:  [facility]  PHYSICIAN:  Jola Schmidt, MD   DATE OF BIRTH:  Apr 07, 1951  DATE OF PROCEDURE:  11/12/2015 DATE OF DISCHARGE:                              OPERATIVE REPORT   PREOPERATIVE DIAGNOSES: 1. Postmenopausal bleeding. 2. Cervical stenosis.  POSTOPERATIVE DIAGNOSES: 1. Postmenopausal bleeding. 2. Cervical stenosis. 3. Endometrial mass.  PROCEDURE:  Hysteroscopy, D and C, with MyoSure.  SURGEON:  Raul Del. Simona Huh, M.D.  ASSISTANT:  Technician.  ANESTHESIA:  General.  URINE OUTPUT:  25 mL.  BLOOD:  5.  HYSTEROSCOPY DEFICIT:  245.  DRAINS:  None.  LOCAL:  2% lidocaine 10 mL.  SPECIMEN:  Endometrial mass and curettings.  Disposition of specimens to Pathology.  DISPOSITION:  PACU.  Hemodynamically stable.  COMPLICATIONS:  None.  FINDINGS:  Thickened endometrium throughout the entire cavity. Calcified mass on the anterior and right side of the uterus as evidenced by MyoSure.  See body of procedure.  PROCEDURE IN DETAIL:  Ms. Moncion was taken to the operating room with IV running.  She underwent general anesthesia without complication.  She was then placed in the dorsal lithotomy position.  SCDs were placed and activated.  A time-out was performed.  Grave speculum was inserted into the vagina.  The anterior lip of the cervix was injected with 2% lidocaine and single-tooth tenaculum was placed on the anterior lip of the cervix.  Paracervical block was performed for a total of 10 mL.  The cervix was then sounded up to a 6 dilator.  The hysteroscope was advanced and initially, the fundus of the uterus appeared normal, but there did appear this polypoid and vesicular type mass.  I did see 1 darkened spot in the fundus and I attempted to biopsy it and when I did so, I realized how sick the endometrium was.  I then decided to  do the MyoSure of the polypoid, went in to get the anterior and left side of the uterus, and procedure was going along. Then, I heard a really clicking kind of nails on a chalkboard type sound.  I did not see a fibroid.  I did not see a discrete mass that would warrant that noise, so we got another one and the same thing happened, and per the technician, I guess my summation is that the area that I was trying to remove was calcified.  I did check other areas of the uterus and I did not make that noise and it was only in that section, however, I could not see a very discrete mass, but I got a good portion of both of those areas.  The MyoSure was removed.  I did curettage of all 4 quadrants that particular area where the calcification was felt, was not hard.  It was irregular.  It was actually quite smooth, so I am interested to see what the biopsies show.  So, I did a sharp curettage of all areas.  I did not see sheets of tissue, but I did see tissue return.  All instruments were removed  from the cervix.  There was no bleeding from the tenaculum site.  Speculum was removed.  The procedure went well.  The patient tolerated procedure well.  All instrument and sponge counts were correct x3.     Jola Schmidt, MD     EBV/MEDQ  D:  11/12/2015  T:  11/12/2015  Job:  [id number]

## 2015-11-12 NOTE — Discharge Instructions (Addendum)
Dilation and Curettage or Vacuum Curettage, Care After Refer to this sheet in the next few weeks. These instructions provide you with information on caring for yourself after your procedure. Your health care provider may also give you more specific instructions. Your treatment has been planned according to current medical practices, but problems sometimes occur. Call your health care provider if you have any problems or questions after your procedure. WHAT TO EXPECT AFTER THE PROCEDURE After your procedure, it is typical to have light cramping and bleeding. This may last for 2 days to 2 weeks after the procedure. HOME CARE INSTRUCTIONS   Do not drive for 24 hours.  Wait 1 week before returning to strenuous activities.  Take your temperature 2 times a day for 4 days and write it down. Provide these temperatures to your health care provider if you develop a fever.  Avoid long periods of standing.  Avoid heavy lifting, pushing, or pulling. Do not lift anything heavier than 10 pounds (4.5 kg).  Limit stair climbing to once or twice a day.  Take rest periods often.  You may resume your usual diet.  Drink enough fluids to keep your urine clear or pale yellow.  Your usual bowel function should return. If you have constipation, you may:  Take a mild laxative with permission from your health care provider.  Add fruit and bran to your diet.  Drink more fluids.  Take showers instead of baths until your health care provider gives you permission to take baths.  Do not go swimming or use a hot tub until your health care provider approves.  Try to have someone with you or available to you the first 24-48 hours, especially if you were given a general anesthetic.  Do not douche, use tampons, or have sex (intercourse) for 2 weeks after the procedure.  Only take over-the-counter or prescription medicines as directed by your health care provider. Do not take aspirin. It can cause  bleeding.  Follow up with your health care provider as directed. SEEK MEDICAL CARE IF:   You have increasing cramps or pain that is not relieved with medicine.  You have abdominal pain that does not seem to be related to the same area of earlier cramping and pain.  You have bad smelling vaginal discharge.  You have a rash.  You are having problems with any medicine. SEEK IMMEDIATE MEDICAL CARE IF:   You have bleeding that is heavier than a normal menstrual period.  You have a fever.  You have chest pain.  You have shortness of breath.  You feel dizzy or feel like fainting.  You pass out.  You have pain in your shoulder strap area.  You have heavy vaginal bleeding with or without blood clots. MAKE SURE YOU:   Understand these instructions.  Will watch your condition.  Will get help right away if you are not doing well or get worse.   This information is not intended to replace advice given to you by your health care provider. Make sure you discuss any questions you have with your health care provider.   Document Released: 11/03/2000 Document Revised: 11/11/2013 Document Reviewed: 06/05/2013 Elsevier Interactive Patient Education 2016 [place] [facility] Care Instructions  Activity: Get plenty of rest for the remainder of the day. A responsible adult should stay with you for 24 hours following the procedure.  For the next 24 hours, DO NOT: -Drive a car -Paediatric nurse -Drink alcoholic beverages -Take any medication unless  instructed by your physician -Make any legal decisions or sign important papers.  Meals: Start with liquid foods such as gelatin or soup. Progress to regular foods as tolerated. Avoid greasy, spicy, heavy foods. If nausea and/or vomiting occur, drink only clear liquids until the nausea and/or vomiting subsides. Call your physician if vomiting continues.  Special Instructions/Symptoms: Your throat may feel dry or sore from  the anesthesia or the breathing tube placed in your throat during surgery. If this causes discomfort, gargle with warm salt water. The discomfort should disappear within 24 hours.  If you had a scopolamine patch placed behind your ear for the management of post- operative nausea and/or vomiting:  1. The medication in the patch is effective for 72 hours, after which it should be removed.  Wrap patch in a tissue and discard in the trash. Wash hands thoroughly with soap and water. 2. You may remove the patch earlier than 72 hours if you experience unpleasant side effects which may include dry mouth, dizziness or visual disturbances. 3. Avoid touching the patch. Wash your hands with soap and water after contact with the patch.

## 2015-11-12 NOTE — Anesthesia Postprocedure Evaluation (Signed)
Anesthesia Post Note  Patient: Kayla Marshall  Procedure(s) Performed: Procedure(s) (LRB): DILATATION & CURETTAGE/HYSTEROSCOPY WITH  MYOSURE (N/A)  Patient location during evaluation: PACU Anesthesia Type: General Level of consciousness: awake and alert Pain management: pain level controlled Vital Signs Assessment: post-procedure vital signs reviewed and stable Respiratory status: spontaneous breathing, nonlabored ventilation, respiratory function stable and patient connected to nasal cannula oxygen Cardiovascular status: blood pressure returned to baseline and stable Postop Assessment: no signs of nausea or vomiting Anesthetic complications: no    Last Vitals:  Filed Vitals:   11/12/15 1030 11/12/15 1045  BP: 119/61   Pulse: 65   Temp:  36.6 C  Resp: 20     Last Pain:  Filed Vitals:   11/12/15 1146  PainSc: 5                  Wylie Russon JENNETTE

## 2015-11-12 NOTE — Transfer of Care (Signed)
Immediate Anesthesia Transfer of Care Note  Patient: Kayla Marshall  Procedure(s) Performed: Procedure(s): DILATATION & CURETTAGE/HYSTEROSCOPY WITH  MYOSURE (N/A)  Patient Location: PACU  Anesthesia Type:General  Level of Consciousness: awake, alert , oriented and patient cooperative  Airway & Oxygen Therapy: Patient Spontanous Breathing and Patient connected to nasal cannula oxygen  Post-op Assessment: Report given to RN and Post -op Vital signs reviewed and stable  Post vital signs: Reviewed and stable  Last Vitals:  Filed Vitals:   11/12/15 0752  BP: 144/78  Pulse: 73  Temp: 36.7 C  Resp: 18    Complications: No apparent anesthesia complications

## 2015-11-15 ENCOUNTER — Encounter (HOSPITAL_COMMUNITY): Payer: Self-pay | Admitting: Obstetrics and Gynecology

## 2015-11-24 DIAGNOSIS — M461 Sacroiliitis, not elsewhere classified: Secondary | ICD-10-CM | POA: Diagnosis not present

## 2015-11-24 DIAGNOSIS — M5137 Other intervertebral disc degeneration, lumbosacral region: Secondary | ICD-10-CM | POA: Diagnosis not present

## 2015-11-24 DIAGNOSIS — M5127 Other intervertebral disc displacement, lumbosacral region: Secondary | ICD-10-CM | POA: Diagnosis not present

## 2015-11-24 DIAGNOSIS — M791 Myalgia: Secondary | ICD-10-CM | POA: Diagnosis not present

## 2015-12-01 DIAGNOSIS — M5137 Other intervertebral disc degeneration, lumbosacral region: Secondary | ICD-10-CM | POA: Diagnosis not present

## 2015-12-01 DIAGNOSIS — M461 Sacroiliitis, not elsewhere classified: Secondary | ICD-10-CM | POA: Diagnosis not present

## 2015-12-01 DIAGNOSIS — M791 Myalgia: Secondary | ICD-10-CM | POA: Diagnosis not present

## 2015-12-01 DIAGNOSIS — M545 Low back pain: Secondary | ICD-10-CM | POA: Diagnosis not present

## 2015-12-01 DIAGNOSIS — M5127 Other intervertebral disc displacement, lumbosacral region: Secondary | ICD-10-CM | POA: Diagnosis not present

## 2015-12-02 DIAGNOSIS — G4733 Obstructive sleep apnea (adult) (pediatric): Secondary | ICD-10-CM | POA: Diagnosis not present

## 2015-12-04 DIAGNOSIS — J209 Acute bronchitis, unspecified: Secondary | ICD-10-CM | POA: Diagnosis not present

## 2015-12-06 MED FILL — DOXYCYCLINE HYCLATE 100 MG: 100 | 10 days supply | Qty: 20 | Fill #0

## 2015-12-08 DIAGNOSIS — M5417 Radiculopathy, lumbosacral region: Secondary | ICD-10-CM | POA: Diagnosis not present

## 2015-12-08 DIAGNOSIS — M5137 Other intervertebral disc degeneration, lumbosacral region: Secondary | ICD-10-CM | POA: Diagnosis not present

## 2015-12-13 DIAGNOSIS — J069 Acute upper respiratory infection, unspecified: Secondary | ICD-10-CM | POA: Diagnosis not present

## 2015-12-13 DIAGNOSIS — Z1211 Encounter for screening for malignant neoplasm of colon: Secondary | ICD-10-CM | POA: Diagnosis not present

## 2015-12-13 DIAGNOSIS — Z0000 Encounter for general adult medical examination without abnormal findings: Secondary | ICD-10-CM | POA: Diagnosis not present

## 2015-12-13 DIAGNOSIS — I10 Essential (primary) hypertension: Secondary | ICD-10-CM | POA: Diagnosis not present

## 2015-12-15 DIAGNOSIS — H10012 Acute follicular conjunctivitis, left eye: Secondary | ICD-10-CM | POA: Diagnosis not present

## 2015-12-15 DIAGNOSIS — H40052 Ocular hypertension, left eye: Secondary | ICD-10-CM | POA: Diagnosis not present

## 2015-12-15 DIAGNOSIS — H402231 Chronic angle-closure glaucoma, bilateral, mild stage: Secondary | ICD-10-CM | POA: Diagnosis not present

## 2015-12-17 DIAGNOSIS — M5137 Other intervertebral disc degeneration, lumbosacral region: Secondary | ICD-10-CM | POA: Diagnosis not present

## 2015-12-17 DIAGNOSIS — M791 Myalgia: Secondary | ICD-10-CM | POA: Diagnosis not present

## 2015-12-17 DIAGNOSIS — M461 Sacroiliitis, not elsewhere classified: Secondary | ICD-10-CM | POA: Diagnosis not present

## 2015-12-17 DIAGNOSIS — M5127 Other intervertebral disc displacement, lumbosacral region: Secondary | ICD-10-CM | POA: Diagnosis not present

## 2015-12-20 DIAGNOSIS — H402231 Chronic angle-closure glaucoma, bilateral, mild stage: Secondary | ICD-10-CM | POA: Diagnosis not present

## 2015-12-20 DIAGNOSIS — N950 Postmenopausal bleeding: Secondary | ICD-10-CM | POA: Diagnosis not present

## 2015-12-20 DIAGNOSIS — H40052 Ocular hypertension, left eye: Secondary | ICD-10-CM | POA: Diagnosis not present

## 2015-12-22 DIAGNOSIS — M791 Myalgia: Secondary | ICD-10-CM | POA: Diagnosis not present

## 2015-12-22 DIAGNOSIS — M5137 Other intervertebral disc degeneration, lumbosacral region: Secondary | ICD-10-CM | POA: Diagnosis not present

## 2015-12-22 DIAGNOSIS — M5127 Other intervertebral disc displacement, lumbosacral region: Secondary | ICD-10-CM | POA: Diagnosis not present

## 2015-12-22 DIAGNOSIS — M461 Sacroiliitis, not elsewhere classified: Secondary | ICD-10-CM | POA: Diagnosis not present

## 2015-12-22 DIAGNOSIS — M545 Low back pain: Secondary | ICD-10-CM | POA: Diagnosis not present

## 2015-12-27 MED FILL — LATANOPROST 0.005% EYE DRP: 0.005 | 50 days supply | Qty: 5 | Fill #1

## 2015-12-27 MED FILL — LISINOPRIL 10 MG TABLET: 10 | 90 days supply | Qty: 90 | Fill #0

## 2016-01-02 DIAGNOSIS — G4733 Obstructive sleep apnea (adult) (pediatric): Secondary | ICD-10-CM | POA: Diagnosis not present

## 2016-01-06 DIAGNOSIS — H10012 Acute follicular conjunctivitis, left eye: Secondary | ICD-10-CM | POA: Diagnosis not present

## 2016-01-06 DIAGNOSIS — H40052 Ocular hypertension, left eye: Secondary | ICD-10-CM | POA: Diagnosis not present

## 2016-01-06 DIAGNOSIS — H402231 Chronic angle-closure glaucoma, bilateral, mild stage: Secondary | ICD-10-CM | POA: Diagnosis not present

## 2016-01-10 DIAGNOSIS — M5127 Other intervertebral disc displacement, lumbosacral region: Secondary | ICD-10-CM | POA: Diagnosis not present

## 2016-01-10 DIAGNOSIS — M461 Sacroiliitis, not elsewhere classified: Secondary | ICD-10-CM | POA: Diagnosis not present

## 2016-01-10 DIAGNOSIS — M5137 Other intervertebral disc degeneration, lumbosacral region: Secondary | ICD-10-CM | POA: Diagnosis not present

## 2016-01-10 DIAGNOSIS — M791 Myalgia: Secondary | ICD-10-CM | POA: Diagnosis not present

## 2016-01-17 DIAGNOSIS — M5127 Other intervertebral disc displacement, lumbosacral region: Secondary | ICD-10-CM | POA: Diagnosis not present

## 2016-01-17 DIAGNOSIS — M791 Myalgia: Secondary | ICD-10-CM | POA: Diagnosis not present

## 2016-01-17 DIAGNOSIS — M545 Low back pain: Secondary | ICD-10-CM | POA: Diagnosis not present

## 2016-01-17 DIAGNOSIS — M461 Sacroiliitis, not elsewhere classified: Secondary | ICD-10-CM | POA: Diagnosis not present

## 2016-01-17 DIAGNOSIS — M5137 Other intervertebral disc degeneration, lumbosacral region: Secondary | ICD-10-CM | POA: Diagnosis not present

## 2016-01-21 DIAGNOSIS — M4722 Other spondylosis with radiculopathy, cervical region: Secondary | ICD-10-CM | POA: Diagnosis not present

## 2016-01-21 DIAGNOSIS — M50222 Other cervical disc displacement at C5-C6 level: Secondary | ICD-10-CM | POA: Diagnosis not present

## 2016-01-21 DIAGNOSIS — M461 Sacroiliitis, not elsewhere classified: Secondary | ICD-10-CM | POA: Diagnosis not present

## 2016-01-21 DIAGNOSIS — M791 Myalgia: Secondary | ICD-10-CM | POA: Diagnosis not present

## 2016-01-26 DIAGNOSIS — M461 Sacroiliitis, not elsewhere classified: Secondary | ICD-10-CM | POA: Diagnosis not present

## 2016-01-26 DIAGNOSIS — M545 Low back pain: Secondary | ICD-10-CM | POA: Diagnosis not present

## 2016-01-26 DIAGNOSIS — M791 Myalgia: Secondary | ICD-10-CM | POA: Diagnosis not present

## 2016-01-26 DIAGNOSIS — M4722 Other spondylosis with radiculopathy, cervical region: Secondary | ICD-10-CM | POA: Diagnosis not present

## 2016-01-26 DIAGNOSIS — M50222 Other cervical disc displacement at C5-C6 level: Secondary | ICD-10-CM | POA: Diagnosis not present

## 2016-01-28 DIAGNOSIS — H40052 Ocular hypertension, left eye: Secondary | ICD-10-CM | POA: Diagnosis not present

## 2016-01-28 DIAGNOSIS — H402231 Chronic angle-closure glaucoma, bilateral, mild stage: Secondary | ICD-10-CM | POA: Diagnosis not present

## 2016-02-03 DIAGNOSIS — M9903 Segmental and somatic dysfunction of lumbar region: Secondary | ICD-10-CM | POA: Diagnosis not present

## 2016-02-03 DIAGNOSIS — M5430 Sciatica, unspecified side: Secondary | ICD-10-CM | POA: Diagnosis not present

## 2016-02-03 DIAGNOSIS — S338XXA Sprain of other parts of lumbar spine and pelvis, initial encounter: Secondary | ICD-10-CM | POA: Diagnosis not present

## 2016-02-03 DIAGNOSIS — M9901 Segmental and somatic dysfunction of cervical region: Secondary | ICD-10-CM | POA: Diagnosis not present

## 2016-02-03 DIAGNOSIS — M5136 Other intervertebral disc degeneration, lumbar region: Secondary | ICD-10-CM | POA: Diagnosis not present

## 2016-02-03 DIAGNOSIS — M542 Cervicalgia: Secondary | ICD-10-CM | POA: Diagnosis not present

## 2016-02-03 DIAGNOSIS — S134XXA Sprain of ligaments of cervical spine, initial encounter: Secondary | ICD-10-CM | POA: Diagnosis not present

## 2016-02-04 MED FILL — LATANOPROST 0.005% EYE DRP: 0.005 | 74 days supply | Qty: 8 | Fill #0

## 2016-02-09 ENCOUNTER — Ambulatory Visit: Payer: 59 | Admitting: Pulmonary Disease

## 2016-02-22 DIAGNOSIS — M9903 Segmental and somatic dysfunction of lumbar region: Secondary | ICD-10-CM | POA: Diagnosis not present

## 2016-02-22 DIAGNOSIS — M5136 Other intervertebral disc degeneration, lumbar region: Secondary | ICD-10-CM | POA: Diagnosis not present

## 2016-02-22 DIAGNOSIS — M5430 Sciatica, unspecified side: Secondary | ICD-10-CM | POA: Diagnosis not present

## 2016-02-22 DIAGNOSIS — S338XXA Sprain of other parts of lumbar spine and pelvis, initial encounter: Secondary | ICD-10-CM | POA: Diagnosis not present

## 2016-02-22 DIAGNOSIS — M542 Cervicalgia: Secondary | ICD-10-CM | POA: Diagnosis not present

## 2016-02-22 DIAGNOSIS — M9901 Segmental and somatic dysfunction of cervical region: Secondary | ICD-10-CM | POA: Diagnosis not present

## 2016-02-22 DIAGNOSIS — S134XXA Sprain of ligaments of cervical spine, initial encounter: Secondary | ICD-10-CM | POA: Diagnosis not present

## 2016-02-25 DIAGNOSIS — G4733 Obstructive sleep apnea (adult) (pediatric): Secondary | ICD-10-CM | POA: Diagnosis not present

## 2016-02-28 DIAGNOSIS — M461 Sacroiliitis, not elsewhere classified: Secondary | ICD-10-CM | POA: Diagnosis not present

## 2016-02-28 DIAGNOSIS — M50222 Other cervical disc displacement at C5-C6 level: Secondary | ICD-10-CM | POA: Diagnosis not present

## 2016-02-28 DIAGNOSIS — M791 Myalgia: Secondary | ICD-10-CM | POA: Diagnosis not present

## 2016-02-28 DIAGNOSIS — M4722 Other spondylosis with radiculopathy, cervical region: Secondary | ICD-10-CM | POA: Diagnosis not present

## 2016-03-01 DIAGNOSIS — M9903 Segmental and somatic dysfunction of lumbar region: Secondary | ICD-10-CM | POA: Diagnosis not present

## 2016-03-01 DIAGNOSIS — M9901 Segmental and somatic dysfunction of cervical region: Secondary | ICD-10-CM | POA: Diagnosis not present

## 2016-03-01 DIAGNOSIS — M5430 Sciatica, unspecified side: Secondary | ICD-10-CM | POA: Diagnosis not present

## 2016-03-01 DIAGNOSIS — S134XXA Sprain of ligaments of cervical spine, initial encounter: Secondary | ICD-10-CM | POA: Diagnosis not present

## 2016-03-01 DIAGNOSIS — M5136 Other intervertebral disc degeneration, lumbar region: Secondary | ICD-10-CM | POA: Diagnosis not present

## 2016-03-01 DIAGNOSIS — S338XXA Sprain of other parts of lumbar spine and pelvis, initial encounter: Secondary | ICD-10-CM | POA: Diagnosis not present

## 2016-03-01 DIAGNOSIS — M542 Cervicalgia: Secondary | ICD-10-CM | POA: Diagnosis not present

## 2016-03-03 DIAGNOSIS — M5430 Sciatica, unspecified side: Secondary | ICD-10-CM | POA: Diagnosis not present

## 2016-03-03 DIAGNOSIS — S134XXA Sprain of ligaments of cervical spine, initial encounter: Secondary | ICD-10-CM | POA: Diagnosis not present

## 2016-03-03 DIAGNOSIS — M5136 Other intervertebral disc degeneration, lumbar region: Secondary | ICD-10-CM | POA: Diagnosis not present

## 2016-03-03 DIAGNOSIS — M9901 Segmental and somatic dysfunction of cervical region: Secondary | ICD-10-CM | POA: Diagnosis not present

## 2016-03-03 DIAGNOSIS — M9903 Segmental and somatic dysfunction of lumbar region: Secondary | ICD-10-CM | POA: Diagnosis not present

## 2016-03-03 DIAGNOSIS — S338XXA Sprain of other parts of lumbar spine and pelvis, initial encounter: Secondary | ICD-10-CM | POA: Diagnosis not present

## 2016-03-03 DIAGNOSIS — M542 Cervicalgia: Secondary | ICD-10-CM | POA: Diagnosis not present

## 2016-03-10 DIAGNOSIS — M5430 Sciatica, unspecified side: Secondary | ICD-10-CM | POA: Diagnosis not present

## 2016-03-10 DIAGNOSIS — S134XXA Sprain of ligaments of cervical spine, initial encounter: Secondary | ICD-10-CM | POA: Diagnosis not present

## 2016-03-10 DIAGNOSIS — M9901 Segmental and somatic dysfunction of cervical region: Secondary | ICD-10-CM | POA: Diagnosis not present

## 2016-03-10 DIAGNOSIS — M9903 Segmental and somatic dysfunction of lumbar region: Secondary | ICD-10-CM | POA: Diagnosis not present

## 2016-03-10 DIAGNOSIS — S338XXA Sprain of other parts of lumbar spine and pelvis, initial encounter: Secondary | ICD-10-CM | POA: Diagnosis not present

## 2016-03-10 DIAGNOSIS — M542 Cervicalgia: Secondary | ICD-10-CM | POA: Diagnosis not present

## 2016-03-10 DIAGNOSIS — M5136 Other intervertebral disc degeneration, lumbar region: Secondary | ICD-10-CM | POA: Diagnosis not present

## 2016-03-21 MED FILL — LISINOPRIL 10 MG TABLET: 10 | 90 days supply | Qty: 90 | Fill #1

## 2016-04-05 DIAGNOSIS — R5382 Chronic fatigue, unspecified: Secondary | ICD-10-CM | POA: Diagnosis not present

## 2016-04-05 DIAGNOSIS — E669 Obesity, unspecified: Secondary | ICD-10-CM | POA: Diagnosis not present

## 2016-04-05 DIAGNOSIS — E538 Deficiency of other specified B group vitamins: Secondary | ICD-10-CM | POA: Diagnosis not present

## 2016-04-05 DIAGNOSIS — F329 Major depressive disorder, single episode, unspecified: Secondary | ICD-10-CM | POA: Diagnosis not present

## 2016-04-05 DIAGNOSIS — E039 Hypothyroidism, unspecified: Secondary | ICD-10-CM | POA: Diagnosis not present

## 2016-04-05 DIAGNOSIS — F419 Anxiety disorder, unspecified: Secondary | ICD-10-CM | POA: Diagnosis not present

## 2016-04-06 DIAGNOSIS — M50222 Other cervical disc displacement at C5-C6 level: Secondary | ICD-10-CM | POA: Diagnosis not present

## 2016-04-06 DIAGNOSIS — M791 Myalgia: Secondary | ICD-10-CM | POA: Diagnosis not present

## 2016-04-06 DIAGNOSIS — M4722 Other spondylosis with radiculopathy, cervical region: Secondary | ICD-10-CM | POA: Diagnosis not present

## 2016-04-06 DIAGNOSIS — M545 Low back pain: Secondary | ICD-10-CM | POA: Diagnosis not present

## 2016-04-06 DIAGNOSIS — M461 Sacroiliitis, not elsewhere classified: Secondary | ICD-10-CM | POA: Diagnosis not present

## 2016-04-12 DIAGNOSIS — R350 Frequency of micturition: Secondary | ICD-10-CM | POA: Diagnosis not present

## 2016-04-12 DIAGNOSIS — I10 Essential (primary) hypertension: Secondary | ICD-10-CM | POA: Diagnosis not present

## 2016-04-12 DIAGNOSIS — R609 Edema, unspecified: Secondary | ICD-10-CM | POA: Diagnosis not present

## 2016-04-12 DIAGNOSIS — F419 Anxiety disorder, unspecified: Secondary | ICD-10-CM | POA: Diagnosis not present

## 2016-04-12 DIAGNOSIS — G4726 Circadian rhythm sleep disorder, shift work type: Secondary | ICD-10-CM | POA: Diagnosis not present

## 2016-04-12 DIAGNOSIS — F4320 Adjustment disorder, unspecified: Secondary | ICD-10-CM | POA: Diagnosis not present

## 2016-04-15 IMAGING — US US TRANSVAGINAL NON-OB
1 series · 13 of 25 positions shown · non-contrast
Comparison: None.

CLINICAL DATA: Abnormal vaginal bleeding. Pelvic pain.
Postmenopausal.

EXAM:
TRANSABDOMINAL AND TRANSVAGINAL ULTRASOUND OF PELVIS
DOPPLER ULTRASOUND OF OVARIES
TECHNIQUE: Both transabdominal and transvaginal ultrasound examinations of the
pelvis were performed. Transabdominal technique was performed for
global imaging of the pelvis including uterus, ovaries, adnexal
regions, and pelvic cul-de-sac.
It was necessary to proceed with endovaginal exam following the
transabdominal exam to visualize the uterus, endometrium, and
ovaries. Color and duplex Doppler ultrasound was utilized to
evaluate blood flow to the ovaries.

[Series 1: us transvaginal non-ob · 0.23mm/px · 81 acquisitions, 13 frames shown]
[im 1/81]
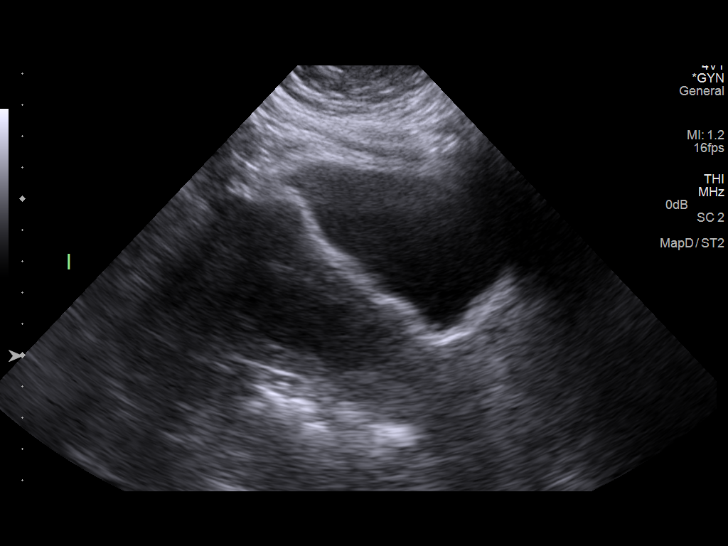
[im 7/81]
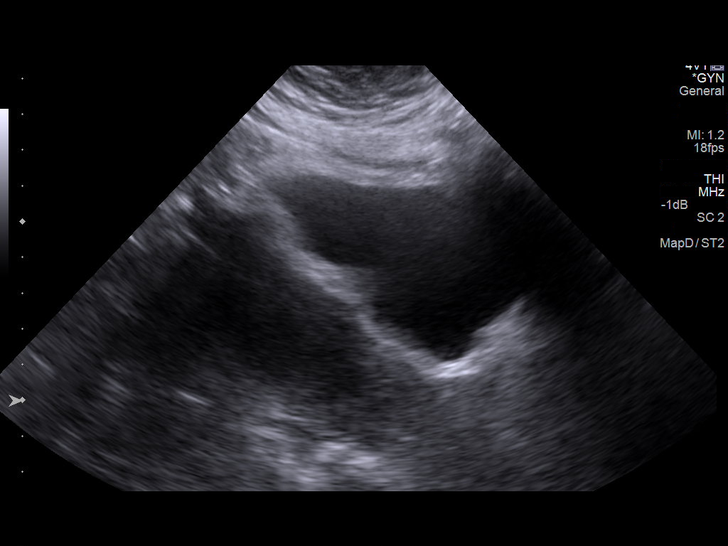
[im 14/81]
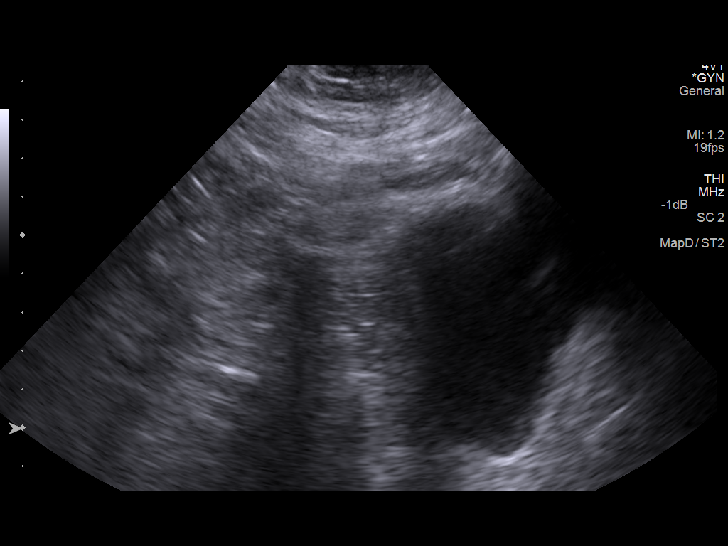
[im 21/81]
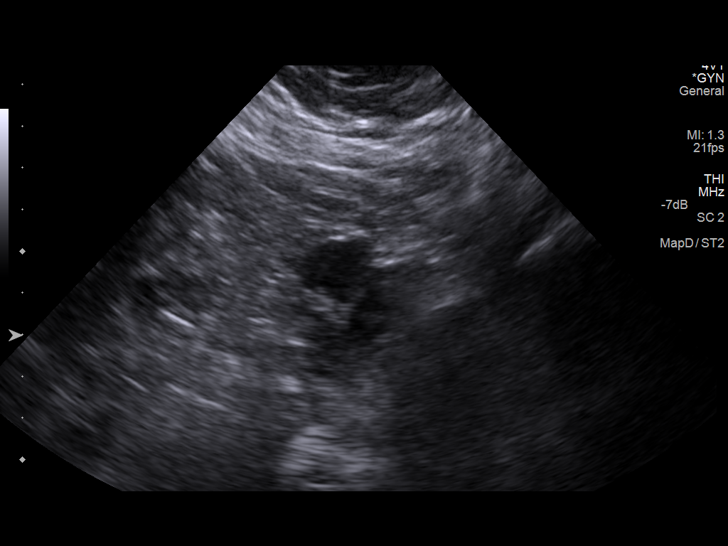
[im 27/81]
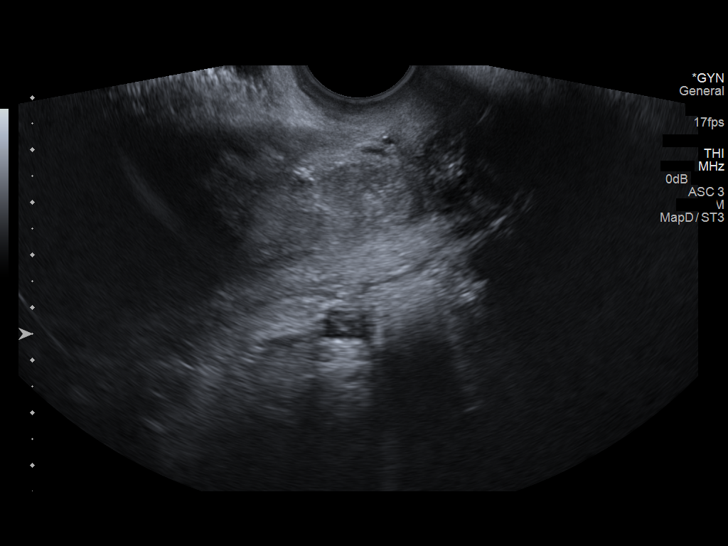
[im 34/81]
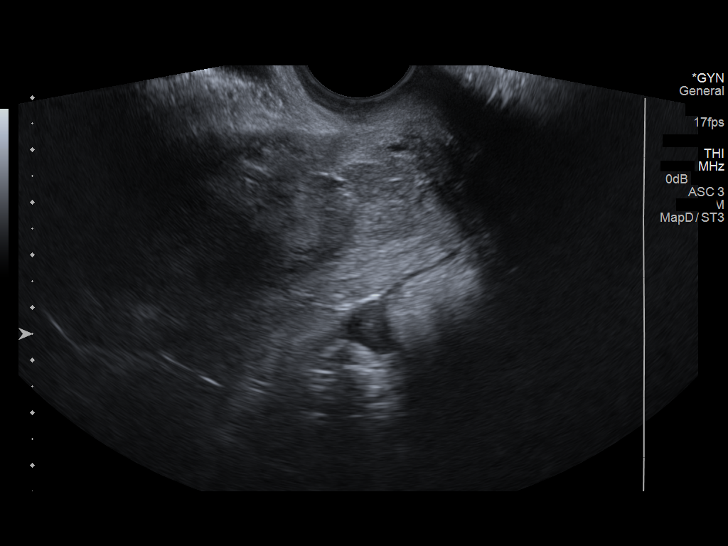
[im 41/81]
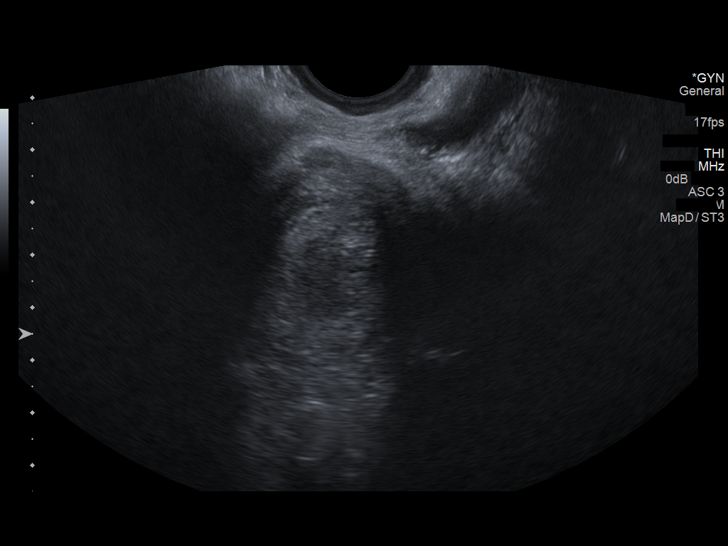
[im 47/81]
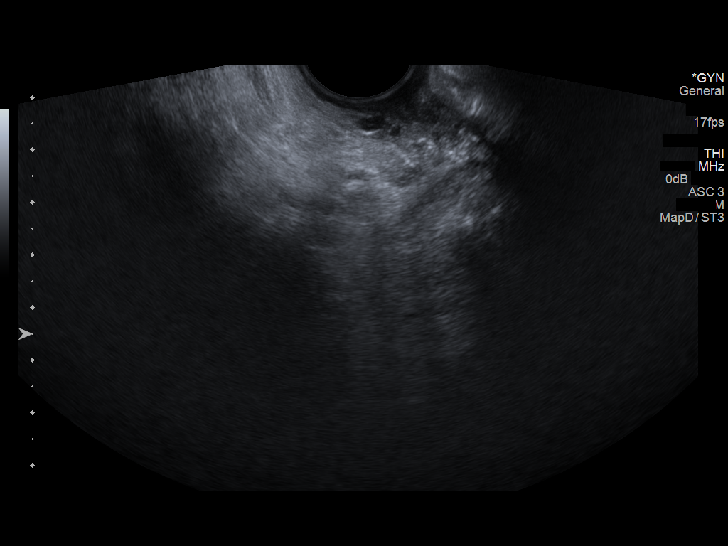
[im 54/81]
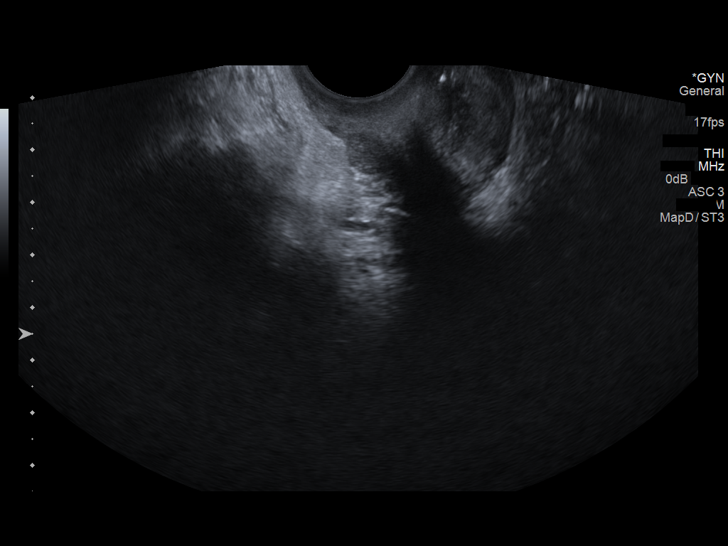
[im 61/81]
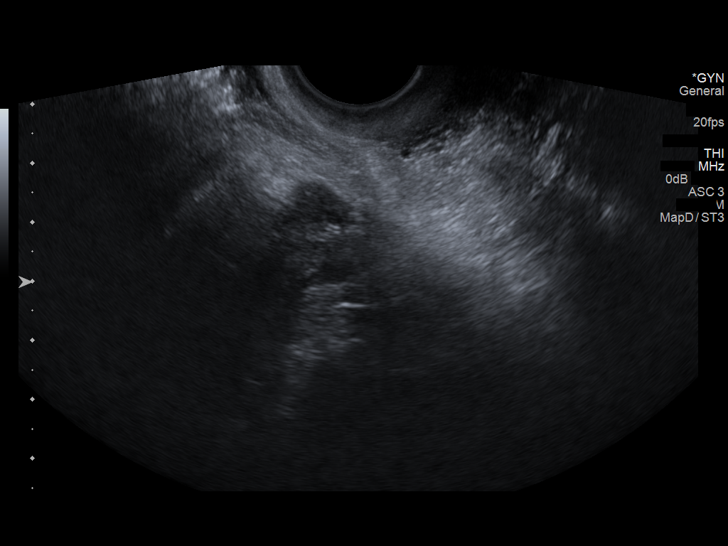
[im 67/81]
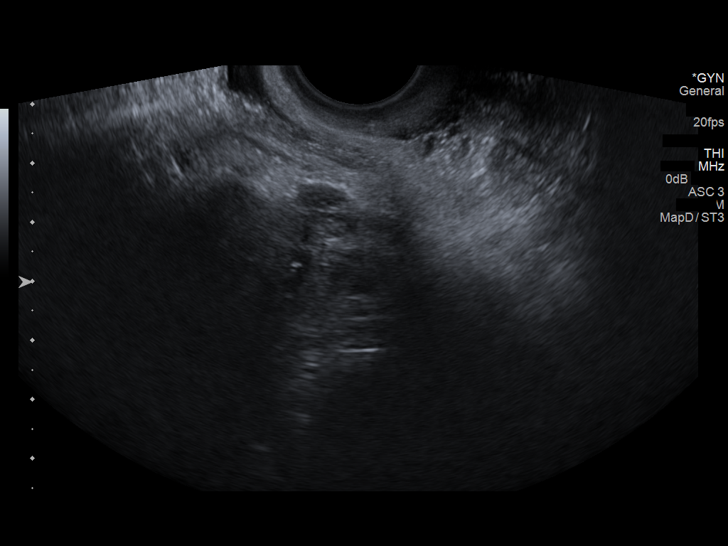
[im 74/81]
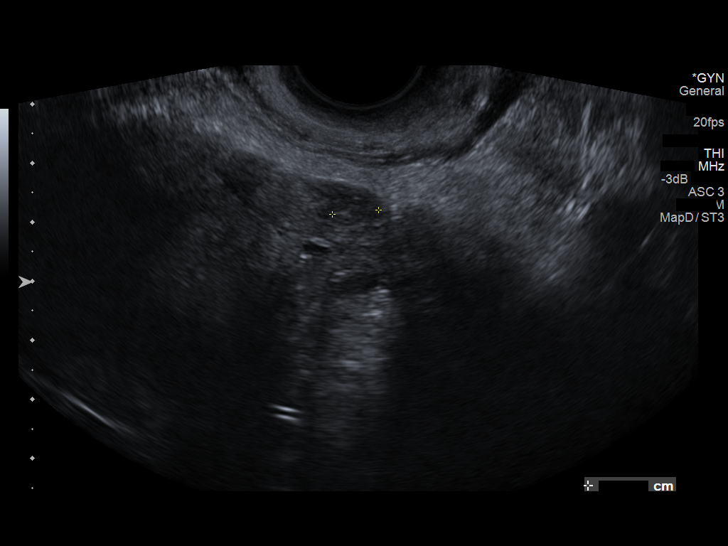
[im 81/81]
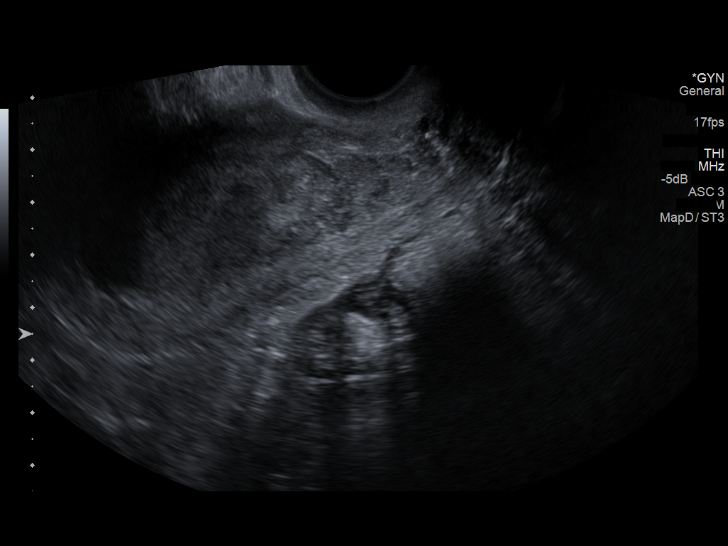

[13 of 25 positions shown; findings below may reference images not displayed]

FINDINGS: Uterus

Measurements: 9.8 x 4.5 x 5.0 cm. No fibroids or other mass
visualized.

Endometrium

Thickness: 4.7 cm. Heterogeneous material is identified throughout
the endometrial canal from the fundus to the cervix. Material
appears avascular on Doppler evaluation.

Right ovary

Measurements: The ovary is not visualized, either absent or obscured
. No adnexal mass identified.

Left ovary

Measurements: 2.5 x 1.9 x 2.2 cm. Normal appearance/no adnexal mass.

Pulsed Doppler evaluation of the left ovary demonstrates normal
low-resistance arterial and venous waveforms.

Other findings

No free fluid.
IMPRESSION: 1. Heterogeneous material throughout the endometrial canal,
measuring up to 4.7 cm and suspicious for endometrial carcinoma.
Alternatively the findings could be related to cervical stenosis and
debris or clot within the endometrial canal.
2. No evidence for ovarian torsion.
3. Nonvisualized right ovary.

## 2016-04-21 MED FILL — LATANOPROST 0.005% EYE DRP: 0.005 | 50 days supply | Qty: 5 | Fill #1

## 2016-06-02 MED FILL — BILTRICIDE 600 MG TABLET: 600 | 21 days supply | Qty: 2 | Fill #0

## 2016-06-16 MED FILL — LISINOPRIL 10 MG TABLET: 10 | 90 days supply | Qty: 90 | Fill #0

## 2016-06-16 MED FILL — LATANOPROST 0.005% EYE DRP: 0.005 | 75 days supply | Qty: 8 | Fill #0

## 2016-06-28 DIAGNOSIS — Z72820 Sleep deprivation: Secondary | ICD-10-CM | POA: Diagnosis not present

## 2016-06-28 DIAGNOSIS — I8393 Asymptomatic varicose veins of bilateral lower extremities: Secondary | ICD-10-CM | POA: Diagnosis not present

## 2016-06-28 DIAGNOSIS — I10 Essential (primary) hypertension: Secondary | ICD-10-CM | POA: Diagnosis not present

## 2016-06-28 DIAGNOSIS — M5442 Lumbago with sciatica, left side: Secondary | ICD-10-CM | POA: Diagnosis not present

## 2016-06-28 DIAGNOSIS — G8929 Other chronic pain: Secondary | ICD-10-CM | POA: Diagnosis not present

## 2016-06-28 DIAGNOSIS — R609 Edema, unspecified: Secondary | ICD-10-CM | POA: Diagnosis not present

## 2016-06-28 DIAGNOSIS — R5383 Other fatigue: Secondary | ICD-10-CM | POA: Diagnosis not present

## 2016-07-18 DIAGNOSIS — R21 Rash and other nonspecific skin eruption: Secondary | ICD-10-CM | POA: Diagnosis not present

## 2016-07-18 MED FILL — MOMETASONE FUROATE 0.1% CRM: 0.1 | 30 days supply | Qty: 45 | Fill #0

## 2016-07-19 DIAGNOSIS — H01003 Unspecified blepharitis right eye, unspecified eyelid: Secondary | ICD-10-CM | POA: Diagnosis not present

## 2016-07-19 DIAGNOSIS — H04129 Dry eye syndrome of unspecified lacrimal gland: Secondary | ICD-10-CM | POA: Diagnosis not present

## 2016-07-19 DIAGNOSIS — H40023 Open angle with borderline findings, high risk, bilateral: Secondary | ICD-10-CM | POA: Diagnosis not present

## 2016-07-19 DIAGNOSIS — H524 Presbyopia: Secondary | ICD-10-CM | POA: Diagnosis not present

## 2016-07-19 DIAGNOSIS — H40033 Anatomical narrow angle, bilateral: Secondary | ICD-10-CM | POA: Diagnosis not present

## 2016-07-20 DIAGNOSIS — H6123 Impacted cerumen, bilateral: Secondary | ICD-10-CM | POA: Diagnosis not present

## 2016-08-22 MED FILL — LATANOPROST 0.005% EYE DRP: 0.005 | 75 days supply | Qty: 8 | Fill #0

## 2016-08-28 DIAGNOSIS — N950 Postmenopausal bleeding: Secondary | ICD-10-CM | POA: Diagnosis not present

## 2016-09-05 ENCOUNTER — Telehealth: Payer: Self-pay | Admitting: *Deleted

## 2016-09-05 NOTE — Telephone Encounter (Signed)
Pt states she has and ingrown toenail and is going to [place] and doesn't want to cancel the trip, what can be done for an ingrown.  I informed the pt depending on the doctor and the severity of the problem it may be able to be trimmed out and soaks keep comfortable until her return. I instructed pt to begin 1/2C epsom salt in 1Qt warm water daily until seen and cover with antibiotic ointment after each soak. Pt states understanding.

## 2016-09-07 DIAGNOSIS — L03032 Cellulitis of left toe: Secondary | ICD-10-CM | POA: Diagnosis not present

## 2016-09-07 DIAGNOSIS — Z23 Encounter for immunization: Secondary | ICD-10-CM | POA: Diagnosis not present

## 2016-09-11 ENCOUNTER — Telehealth: Payer: Self-pay | Admitting: Podiatry

## 2016-09-11 NOTE — Telephone Encounter (Signed)
Left voicemail advising pt of her appt time.

## 2016-09-12 ENCOUNTER — Encounter: Payer: Self-pay | Admitting: Podiatry

## 2016-09-12 ENCOUNTER — Ambulatory Visit (INDEPENDENT_AMBULATORY_CARE_PROVIDER_SITE_OTHER): Payer: 59 | Admitting: Podiatry

## 2016-09-12 DIAGNOSIS — L600 Ingrowing nail: Secondary | ICD-10-CM | POA: Diagnosis not present

## 2016-09-12 DIAGNOSIS — B351 Tinea unguium: Secondary | ICD-10-CM

## 2016-09-12 DIAGNOSIS — M79676 Pain in unspecified toe(s): Secondary | ICD-10-CM

## 2016-09-12 NOTE — Progress Notes (Signed)
She presents today with chief complaint of an ingrown nail to the hallux left. She states that the lateral border was looked at by my primary provider and stated that I probably have a fungus but no signs of bacterial infection. She states that I feel I have an ingrown toenail. She is leaving for [facility] Thursday to see her granddaughter and grandson.  Objective: Vital signs are stable alert and oriented 3. Pulses are palpable. Sharply debrided nail margin on the tibia and fibular border of the hallux left with subungual debris probable nail dystrophy and onychomycosis.  Assessment: Ingrown nail onychomycosis hallux left.  Plan: I sharply debrided these 2 margins today to alleviate her symptoms. We'll follow-up with her as needed.

## 2016-09-13 MED FILL — LISINOPRIL-HCTZ 10-12.5 MG: 10-12.5 | 90 days supply | Qty: 90 | Fill #0

## 2016-09-14 MED FILL — LISINOPRIL 10 MG TABLET: 10 | 90 days supply | Qty: 90 | Fill #0

## 2016-09-14 MED FILL — ALPRAZolam 0.25 MG TABS: 0.25 | 20 days supply | Qty: 30 | Fill #0

## 2016-09-26 DIAGNOSIS — N950 Postmenopausal bleeding: Secondary | ICD-10-CM | POA: Diagnosis not present

## 2016-09-26 DIAGNOSIS — R5383 Other fatigue: Secondary | ICD-10-CM | POA: Diagnosis not present

## 2016-09-26 DIAGNOSIS — R609 Edema, unspecified: Secondary | ICD-10-CM | POA: Diagnosis not present

## 2016-09-26 DIAGNOSIS — M47816 Spondylosis without myelopathy or radiculopathy, lumbar region: Secondary | ICD-10-CM | POA: Diagnosis not present

## 2016-09-26 DIAGNOSIS — F419 Anxiety disorder, unspecified: Secondary | ICD-10-CM | POA: Diagnosis not present

## 2016-09-26 DIAGNOSIS — I10 Essential (primary) hypertension: Secondary | ICD-10-CM | POA: Diagnosis not present

## 2016-10-30 DIAGNOSIS — M25512 Pain in left shoulder: Secondary | ICD-10-CM | POA: Diagnosis not present

## 2016-10-30 DIAGNOSIS — R300 Dysuria: Secondary | ICD-10-CM | POA: Diagnosis not present

## 2016-11-01 MED FILL — LATANOPROST 0.005% EYE DRP: 0.005 | 49 days supply | Qty: 5 | Fill #1

## 2016-11-16 ENCOUNTER — Ambulatory Visit (INDEPENDENT_AMBULATORY_CARE_PROVIDER_SITE_OTHER): Payer: 59 | Admitting: Podiatry

## 2016-11-16 ENCOUNTER — Encounter: Payer: Self-pay | Admitting: Podiatry

## 2016-11-16 ENCOUNTER — Ambulatory Visit (INDEPENDENT_AMBULATORY_CARE_PROVIDER_SITE_OTHER): Payer: 59

## 2016-11-16 DIAGNOSIS — M76821 Posterior tibial tendinitis, right leg: Secondary | ICD-10-CM

## 2016-11-16 DIAGNOSIS — M722 Plantar fascial fibromatosis: Secondary | ICD-10-CM

## 2016-11-16 DIAGNOSIS — M79671 Pain in right foot: Secondary | ICD-10-CM

## 2016-11-16 MED ORDER — DICLOFENAC SODIUM 75 MG PO TBEC: 75.0000 mg | DELAYED_RELEASE_TABLET | Freq: Two times a day (BID) | ORAL | 0 refills | Status: DC

## 2016-11-16 MED FILL — DICLOFENAC SOD 75 MG TAB EC: 75 | 15 days supply | Qty: 30 | Fill #0

## 2016-11-16 NOTE — Patient Instructions (Signed)
Posterior Tibialis Tendinosis Rehab Ask your health care provider which exercises are safe for you. Do exercises exactly as told by your health care provider and adjust them as directed. It is normal to feel mild stretching, pulling, tightness, or discomfort as you do these exercises, but you should stop right away if you feel sudden pain or your pain gets worse.Do not begin these exercises until told by your health care provider. Stretching and range of motion exercises These exercises warm up your muscles and joints and improve the movement and flexibility in your ankle and foot. These exercises may also help to relieve pain. Exercise A: Standing wall calf stretch, knee straight   1. Stand with your hands against a wall. 2. Extend your __________ leg behind you, and bend your front knee slightly. Keep both of your heels on the floor. 3. Point the toes of your back foot slightly inward. 4. Keeping your heels on the floor and your back knee straight, shift your weight toward the wall. Do not allow your back to arch. You should feel a gentle stretch in the back of your lower leg (calf). 5. Hold this position for __________ seconds. Repeat __________ times. Complete this stretch __________ times a day. Exercise B: Standing wall calf stretch, knee bent  1. Stand with your hands against a wall. 2. Extend your __________ leg behind you, and bend your front knee slightly. Keep both of your heels on the floor. 3. Point the toes of your back foot slightly inward. 4. Unlock your back knee so it is bent. Keep your heels on the floor. You should feel a gentle stretch deep in your calf. 5. Hold this position for __________ seconds. Repeat __________ times. Complete this exercise __________ times a day. Strengthening exercises These exercises build strength and endurance in your ankle and foot. Endurance is the ability to use your muscles for a long time, even after they get tired. Exercise C: Ankle  inversion with band  1. Secure one end of an exercise band or tubing to a fixed object, such as a table leg or a pole, that will stay still when the band is pulled. to an object that will not move if it is pulled on, like a table leg. 2. Loop the other end of the band around the middle of your left / right foot. 3. Sit on the floor facing the object with your __________ leg extended. The band or tube should be slightly tense when your foot is relaxed. 4. Leading with your big toe, slowly bring your __________ foot and ankle inward, toward your other foot. 5. Hold this position for __________ seconds. 6. Slowly return your foot to the starting position. Repeat __________ times. Complete this exercise __________ times a day. Exercise D: Towel curls   1. Sit in a chair on a non-carpeted surface, and put your feet on the floor. 2. Place a towel in front of your feet. If told by your health care provider, add __________ at the end of the towel. 3. Keeping your heel on the floor, put your __________ foot on the towel. 4. Pull the towel toward you by grabbing the towel with your toes and curling them under. Keep your heel on the floor. 5. Let your toes relax. 6. Grab the towel with your toes again. Keep going until the towel is completely underneath your foot. Repeat __________ times. Complete this exercise __________ times a day. Balance exercises These exercises improve or maintain your balance. Balance is   important in preventing falls. Exercise E: Single leg stand  1. Without shoes, stand near a railing or in a doorway. You can hold on to the railing or door frame as needed for balance. 2. Stand on your __________ foot. Keep your big toe down on the floor and try to keep your arch lifted. If balancing in this position is too easy, try the exercise with your eyes closed or while standing on a pillow. 3. Hold this position for __________ seconds. Repeat __________ times. Complete this exercise  __________ times a day. This information is not intended to replace advice given to you by your health care provider. Make sure you discuss any questions you have with your health care provider. Document Released: 11/06/2005 Document Revised: 07/11/2016 Document Reviewed: 07/23/2015 Elsevier Interactive Patient Education  2017 [employer]  

## 2016-11-17 NOTE — Progress Notes (Signed)
Subjective: 65 year old female presents the office today for concerns of pain to the inside aspect of her right foot and she states it is along "my navicular". She states of this started after a new shoes. 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 She denies any recent injury or trauma but she has noticed some swelling to her ankle. She states the swelling started "after my adrenals acted up" from when she was working more hours. She denies any redness or warmth. She's had no recent treatment for this. She does wear over-the-counter inserts. Denies any systemic complaints such as fevers, chills, nausea, vomiting. No acute changes since last appointment, and no other complaints at this time.   Objective: AAO x3, NAD DP/PT pulses palpable bilaterally, CRT less than 3 seconds There is tenderness on the right foot/ankle. The tenderness is along the course of the posterior tibial tendon just posterior and inferior to the medial malleolus and along the insertion into the navicular tuberosity. There is maybe some mild edema to the ankle but there is no erythema or increase in warmth. Also slight discomfort on the medial band of the plantar fascia and the arch of the foot. There is no other areas of pinpoint bony tenderness or pain the vibratory sensation. Mild equinus is present. No open lesions or pre-ulcerative lesions.  No pain with calf compression, swelling, warmth, erythema  Assessment: Posterior tibial tendinitis, right foot; mild plantar fasciitis/arch pain  Plan: -All treatment options discussed with the patient including all alternatives, risks, complications.  -X-rays were obtained and reviewed. There is no evidence of acute fracture identified. -I recommended an ankle brace. Upon trying this and with her today she did not want to proceed with this. A compression anklet was also provided today for her. -Prescribed voltaren. Discussed side effects of the medication and  directed to stop if any are to occur and call the office. (She has an allergy to meloxicam and she states it is not an allergy just didn't help her pain previously.) -Recommended stretching, rehabilitation exercises for the posterior tibial tendon. -Ice to the area. -I recommend custom orthotics given her pain in her job. She was scanned for orthotics and they were sent to [facility] labs. -Discussed the symptoms continue possible MRI. -Follow-up in 3 weeks or sooner if needed. Call any questions concerns meantime.  Celesta Gentile, DPM

## 2016-12-04 ENCOUNTER — Encounter: Payer: 59 | Admitting: Podiatry

## 2016-12-04 NOTE — Patient Instructions (Signed)

## 2016-12-11 ENCOUNTER — Ambulatory Visit: Payer: 59 | Admitting: Podiatry

## 2016-12-15 ENCOUNTER — Encounter: Payer: Self-pay | Admitting: Podiatry

## 2016-12-15 ENCOUNTER — Other Ambulatory Visit: Payer: Self-pay | Admitting: Podiatry

## 2016-12-15 ENCOUNTER — Ambulatory Visit (INDEPENDENT_AMBULATORY_CARE_PROVIDER_SITE_OTHER): Payer: Self-pay | Admitting: Podiatry

## 2016-12-15 DIAGNOSIS — M76821 Posterior tibial tendinitis, right leg: Secondary | ICD-10-CM

## 2016-12-15 DIAGNOSIS — M722 Plantar fascial fibromatosis: Secondary | ICD-10-CM

## 2016-12-15 MED ORDER — DICLOFENAC SODIUM 75 MG PO TBEC: 75.0000 mg | DELAYED_RELEASE_TABLET | Freq: Two times a day (BID) | ORAL | 0 refills | Status: DC

## 2016-12-15 MED FILL — DICLOFENAC SOD 75 MG TAB EC: 75 | 15 days supply | Qty: 30 | Fill #0

## 2016-12-15 MED FILL — LISINOPRIL 10 MG TABLET: 10 | 90 days supply | Qty: 90 | Fill #1

## 2016-12-15 MED FILL — LATANOPROST 0.005% EYE DRP: 0.005 | 75 days supply | Qty: 8 | Fill #0

## 2016-12-15 NOTE — Patient Instructions (Signed)

## 2016-12-15 NOTE — Progress Notes (Signed)
Dispensed patient's orthotics with oral and written instructions for wearing. Patient will follow up with Dr. Jacqualyn Posey in 1 month for an orthotic check.   Patient had multiple questions regarding the orthotics. She states the orthotics are not comfortable and feels like they press too much against the heel. I noticed when she stood up that the medial side of her foot overlapped the orthotic. Offered to send them back to add medial flange but she states she would try them for a couple weeks first. Also sent in an anti-inflammatory compound cream to [facility] since she said that she didn't like to take a lot of the oral anti-inflammatory meds. Return in 4 weeks for follow up or sooner if she is still experiencing discomfort with the orthotics.  She was seen by Bennetta Laos, CMA. She did not want to be seen by myself today due to cost.

## 2017-01-08 MED FILL — ALPRAZolam 0.25 MG TABS: 0.25 | 30 days supply | Qty: 30 | Fill #0

## 2017-01-12 MED FILL — DICLOFENAC SOD 75 MG TAB EC: 75 | 30 days supply | Qty: 30 | Fill #0

## 2017-01-15 DIAGNOSIS — J209 Acute bronchitis, unspecified: Secondary | ICD-10-CM | POA: Diagnosis not present

## 2017-01-15 MED FILL — HYDROCODONE-HOMATROPINE SYR: 5-1.5 | 6 days supply | Qty: 120 | Fill #0

## 2017-01-15 MED FILL — VENTOLIN HFA 90 MCG INHALER: 108 (90 BAS | 25 days supply | Qty: 18 | Fill #0

## 2017-01-17 DIAGNOSIS — J209 Acute bronchitis, unspecified: Secondary | ICD-10-CM | POA: Diagnosis not present

## 2017-01-22 DIAGNOSIS — H6983 Other specified disorders of Eustachian tube, bilateral: Secondary | ICD-10-CM | POA: Diagnosis not present

## 2017-01-22 DIAGNOSIS — I10 Essential (primary) hypertension: Secondary | ICD-10-CM | POA: Diagnosis not present

## 2017-01-22 DIAGNOSIS — R5383 Other fatigue: Secondary | ICD-10-CM | POA: Diagnosis not present

## 2017-01-22 DIAGNOSIS — F419 Anxiety disorder, unspecified: Secondary | ICD-10-CM | POA: Diagnosis not present

## 2017-01-22 DIAGNOSIS — M47816 Spondylosis without myelopathy or radiculopathy, lumbar region: Secondary | ICD-10-CM | POA: Diagnosis not present

## 2017-01-22 MED FILL — FLUTICASONE PROP 50 MCG SPR: 50 | 30 days supply | Qty: 16 | Fill #0

## 2017-02-09 DIAGNOSIS — H04123 Dry eye syndrome of bilateral lacrimal glands: Secondary | ICD-10-CM | POA: Diagnosis not present

## 2017-02-09 DIAGNOSIS — H402231 Chronic angle-closure glaucoma, bilateral, mild stage: Secondary | ICD-10-CM | POA: Diagnosis not present

## 2017-02-26 MED FILL — LATANOPROST 0.005% EYE DRP: 0.005 | 75 days supply | Qty: 8 | Fill #0

## 2017-03-07 ENCOUNTER — Other Ambulatory Visit: Payer: Self-pay | Admitting: Podiatry

## 2017-03-07 MED FILL — DICLOFENAC SOD 75 MG TAB EC: 75 | 7 days supply | Qty: 14 | Fill #0

## 2017-03-07 NOTE — Telephone Encounter (Signed)
Pt needs an appt, prior to future refills.

## 2017-03-08 MED FILL — LISINOPRIL 10 MG TABLET: 10 | 90 days supply | Qty: 90 | Fill #1

## 2017-05-07 MED FILL — LATANOPROST 0.005% EYE DRP: 0.005 | 49 days supply | Qty: 5 | Fill #1

## 2017-05-08 MED FILL — ALPRAZolam 0.25 MG TABS: 0.25 | 30 days supply | Qty: 30 | Fill #0

## 2017-06-06 DIAGNOSIS — M25562 Pain in left knee: Secondary | ICD-10-CM | POA: Diagnosis not present

## 2017-06-06 DIAGNOSIS — J309 Allergic rhinitis, unspecified: Secondary | ICD-10-CM | POA: Diagnosis not present

## 2017-06-06 DIAGNOSIS — Z6841 Body Mass Index (BMI) 40.0 and over, adult: Secondary | ICD-10-CM | POA: Diagnosis not present

## 2017-06-06 DIAGNOSIS — I10 Essential (primary) hypertension: Secondary | ICD-10-CM | POA: Diagnosis not present

## 2017-06-06 DIAGNOSIS — I83813 Varicose veins of bilateral lower extremities with pain: Secondary | ICD-10-CM | POA: Diagnosis not present

## 2017-06-06 DIAGNOSIS — Z713 Dietary counseling and surveillance: Secondary | ICD-10-CM | POA: Diagnosis not present

## 2017-06-06 DIAGNOSIS — Z131 Encounter for screening for diabetes mellitus: Secondary | ICD-10-CM | POA: Diagnosis not present

## 2017-06-06 MED FILL — LISINOPRIL 10 MG TABLET: 10 | 90 days supply | Qty: 90 | Fill #0

## 2017-06-21 ENCOUNTER — Ambulatory Visit
Admission: RE | Admit: 2017-06-21 | Discharge: 2017-06-21 | Disposition: A | Discharge status: Home / Self Care | Payer: 59 | Source: Ambulatory Visit | Attending: Family Medicine | Admitting: Family Medicine

## 2017-06-21 ENCOUNTER — Other Ambulatory Visit: Payer: Self-pay | Admitting: Family Medicine

## 2017-06-21 DIAGNOSIS — M25562 Pain in left knee: Secondary | ICD-10-CM

## 2017-06-21 DIAGNOSIS — S8992XA Unspecified injury of left lower leg, initial encounter: Secondary | ICD-10-CM | POA: Diagnosis not present

## 2017-06-21 MED FILL — LATANOPROST 0.005% EYE DRP: 0.005 | 50 days supply | Qty: 5 | Fill #1

## 2017-06-25 MED FILL — MELOXICAM 15 MG TABLET: 15 | 30 days supply | Qty: 30 | Fill #0

## 2017-06-26 ENCOUNTER — Other Ambulatory Visit: Payer: Self-pay | Admitting: Family Medicine

## 2017-06-26 DIAGNOSIS — M25562 Pain in left knee: Secondary | ICD-10-CM

## 2017-06-28 ENCOUNTER — Ambulatory Visit
Admission: RE | Admit: 2017-06-28 | Discharge: 2017-06-28 | Disposition: A | Discharge status: Home / Self Care | Payer: 59 | Source: Ambulatory Visit | Attending: Family Medicine | Admitting: Family Medicine

## 2017-06-28 DIAGNOSIS — S83232A Complex tear of medial meniscus, current injury, left knee, initial encounter: Secondary | ICD-10-CM | POA: Diagnosis not present

## 2017-06-28 DIAGNOSIS — M25562 Pain in left knee: Secondary | ICD-10-CM

## 2017-07-02 MED FILL — traMADol HCL 50 MG TABS: 50 | 10 days supply | Qty: 40 | Fill #0

## 2017-07-03 DIAGNOSIS — S83242A Other tear of medial meniscus, current injury, left knee, initial encounter: Secondary | ICD-10-CM | POA: Diagnosis not present

## 2017-07-04 ENCOUNTER — Encounter: Payer: Self-pay | Admitting: Podiatry

## 2017-07-04 ENCOUNTER — Ambulatory Visit (INDEPENDENT_AMBULATORY_CARE_PROVIDER_SITE_OTHER): Payer: 59 | Admitting: Podiatry

## 2017-07-04 DIAGNOSIS — M779 Enthesopathy, unspecified: Secondary | ICD-10-CM | POA: Diagnosis not present

## 2017-07-04 DIAGNOSIS — L84 Corns and callosities: Secondary | ICD-10-CM

## 2017-07-04 MED ORDER — TRIAMCINOLONE ACETONIDE 10 MG/ML IJ SUSP
10.0000 mg | Freq: Once | INTRAMUSCULAR | Status: AC
  Administered 2017-07-04: 10 mg

## 2017-07-05 NOTE — Progress Notes (Signed)
Subjective:    Patient ID: Kayla Marshall, female   DOB: 66 y.o.   MRN: [id number]   HPI patient presents stating she has some very intense lesions underneath the fifth metatarsals of both feet and they've been making it hard for her to walk comfortably    ROS      Objective:  Physical Exam neurovascular status intact with patient's inflammatory changes around the fifth MPJ present with fluid buildup and keratotic tissue     Assessment:    Inflammatory capsulitis fifth MPJ bilateral with lesion formation and plantar flex metatarsals     Plan:    H&P and education rendered and today sterile prep and then injected the fifth MPJs with 3 mg dexamethasone Kenalog 5 mg Xylocaine and debrided lesions bilateral and instructed on supportive shoe gear. Reappoint as needed

## 2017-07-10 DIAGNOSIS — R7303 Prediabetes: Secondary | ICD-10-CM | POA: Diagnosis not present

## 2017-07-10 DIAGNOSIS — G8929 Other chronic pain: Secondary | ICD-10-CM | POA: Diagnosis not present

## 2017-07-10 DIAGNOSIS — R946 Abnormal results of thyroid function studies: Secondary | ICD-10-CM | POA: Diagnosis not present

## 2017-07-10 DIAGNOSIS — S83241D Other tear of medial meniscus, current injury, right knee, subsequent encounter: Secondary | ICD-10-CM | POA: Diagnosis not present

## 2017-07-10 DIAGNOSIS — M25562 Pain in left knee: Secondary | ICD-10-CM | POA: Diagnosis not present

## 2017-07-13 ENCOUNTER — Other Ambulatory Visit: Payer: Self-pay | Admitting: Orthopedic Surgery

## 2017-07-25 MED FILL — MELOXICAM 15 MG TABLET: 15 | 30 days supply | Qty: 30 | Fill #0

## 2017-07-26 MED FILL — ALPRAZolam 0.25 MG TABS: 0.25 | 20 days supply | Qty: 30 | Fill #0

## 2017-07-30 ENCOUNTER — Ambulatory Visit (INDEPENDENT_AMBULATORY_CARE_PROVIDER_SITE_OTHER): Payer: 59 | Admitting: Orthopaedic Surgery

## 2017-07-30 ENCOUNTER — Encounter (INDEPENDENT_AMBULATORY_CARE_PROVIDER_SITE_OTHER): Payer: Self-pay | Admitting: Orthopaedic Surgery

## 2017-07-30 DIAGNOSIS — S83242A Other tear of medial meniscus, current injury, left knee, initial encounter: Secondary | ICD-10-CM

## 2017-07-30 DIAGNOSIS — S83207D Unspecified tear of unspecified meniscus, current injury, left knee, subsequent encounter: Secondary | ICD-10-CM | POA: Diagnosis not present

## 2017-07-30 DIAGNOSIS — R7303 Prediabetes: Secondary | ICD-10-CM | POA: Diagnosis not present

## 2017-07-30 DIAGNOSIS — Z1211 Encounter for screening for malignant neoplasm of colon: Secondary | ICD-10-CM | POA: Diagnosis not present

## 2017-07-30 DIAGNOSIS — Z0000 Encounter for general adult medical examination without abnormal findings: Secondary | ICD-10-CM | POA: Diagnosis not present

## 2017-07-30 DIAGNOSIS — I10 Essential (primary) hypertension: Secondary | ICD-10-CM | POA: Diagnosis not present

## 2017-07-30 DIAGNOSIS — F419 Anxiety disorder, unspecified: Secondary | ICD-10-CM | POA: Diagnosis not present

## 2017-07-30 NOTE — Progress Notes (Signed)
Office Visit Note   Patient: Kayla Marshall           Date of Birth: [birth year]           MRN: [id number] Visit Date: 07/30/2017              Requested by: Harlan Stains, MD [place],  [id number] PCP: Vic Ripper, RN (Inactive)   Assessment & Plan: Visit Diagnoses:  1. Acute medial meniscus tear of left knee, initial encounter     Plan: Patient has a medial meniscal root tear. She has had a cortisone injection with 2 weeks of relief. We had a long discussion about treatment options. We agreed that it's reasonable to see how she does from this and give it another month with recheck at that time. If no better may need arthroscopic debridement.  Follow-Up Instructions: Return in about 4 weeks (around 08/27/2017).   Orders:  No orders of the defined types were placed in this encounter.  No orders of the defined types were placed in this encounter.     Procedures: No procedures performed   Clinical Data: No additional findings.   Subjective: Chief Complaint  Patient presents with  . Left Knee - Pain    Patient is 66 year old female with left knee pain on the medial and posterior aspect for a couple months. She was helping her friend move when she felt a tearing pain in her left knee. She has popping with knee range of motion. Denies any swelling. She takes meloxicam and Tylenol. Denies any numbness and tingling.    Review of Systems  Constitutional: Negative.   HENT: Negative.   Eyes: Negative.   Respiratory: Negative.   Cardiovascular: Negative.   Endocrine: Negative.   Musculoskeletal: Negative.   Neurological: Negative.   Hematological: Negative.   Psychiatric/Behavioral: Negative.   All other systems reviewed and are negative.    Objective: Vital Signs: There were no vitals taken for this visit.  Physical Exam  Constitutional: She is oriented to person, place, and time. She appears well-developed and well-nourished.    HENT:  Head: Normocephalic and atraumatic.  Eyes: EOM are normal.  Neck: Neck supple.  Pulmonary/Chest: Effort normal.  Abdominal: Soft.  Neurological: She is alert and oriented to person, place, and time.  Skin: Skin is warm. Capillary refill takes less than 2 seconds.  Psychiatric: She has a normal mood and affect. Her behavior is normal. Judgment and thought content normal.  Nursing note and vitals reviewed.   Ortho Exam Left knee exam shows no joint effusion. She has medial joint line tenderness with McMurray testing. Full range of motion. Collaterals and cruciates are stable. Specialty Comments:  No specialty comments available.  Imaging: No results found.   PMFS History: Patient Active Problem List   Diagnosis Date Noted  . Acute medial meniscus tear of left knee 07/30/2017  . OSA on CPAP 08/26/2015  . Palpitation 08/21/2012  . Unspecified hypothyroidism 08/21/2012   Past Medical History:  Diagnosis Date  . Anxiety   . Arthritis    back  . GERD (gastroesophageal reflux disease)   . Glaucoma   . Hypertension   . Hypothyroidism   . Obesity   . PPH (postpartum hemorrhage)   . Sciatic pain   . Sciatica   . Shortness of breath dyspnea   . Sleep apnea     No family history on file.  Past Surgical History:  Procedure Laterality Date  .  CHOLECYSTECTOMY  1990  . DILATATION & CURETTAGE/HYSTEROSCOPY WITH MYOSURE N/A 11/12/2015   Procedure: DILATATION & CURETTAGE/HYSTEROSCOPY WITH  MYOSURE;  Surgeon: Thurnell Lose, MD;  Location: [facility];  Service: Gynecology;  Laterality: N/A;   Social History   Occupational History  . Not on file.   Social History Main Topics  . Smoking status: Never Smoker  . Smokeless tobacco: Never Used  . Alcohol use Yes     Comment: occas  . Drug use: No  . Sexual activity: Yes    Birth control/ protection: Post-menopausal

## 2017-08-01 MED FILL — LATANOPROST 0.005% EYE DRP: 0.005 | 75 days supply | Qty: 8 | Fill #0

## 2017-08-03 ENCOUNTER — Ambulatory Visit (HOSPITAL_COMMUNITY): Admission: RE | Admit: 2017-08-03 | Payer: 59 | Source: Ambulatory Visit | Admitting: Orthopedic Surgery

## 2017-08-03 ENCOUNTER — Encounter (HOSPITAL_COMMUNITY): Admission: RE | Payer: Self-pay | Source: Ambulatory Visit

## 2017-08-03 SURGERY — ARTHROSCOPY, KNEE
Anesthesia: Choice | Laterality: Left

## 2017-08-06 DIAGNOSIS — H402231 Chronic angle-closure glaucoma, bilateral, mild stage: Secondary | ICD-10-CM | POA: Diagnosis not present

## 2017-08-06 DIAGNOSIS — H04123 Dry eye syndrome of bilateral lacrimal glands: Secondary | ICD-10-CM | POA: Diagnosis not present

## 2017-08-06 DIAGNOSIS — H25013 Cortical age-related cataract, bilateral: Secondary | ICD-10-CM | POA: Diagnosis not present

## 2017-08-06 DIAGNOSIS — H2513 Age-related nuclear cataract, bilateral: Secondary | ICD-10-CM | POA: Diagnosis not present

## 2017-08-27 ENCOUNTER — Ambulatory Visit (INDEPENDENT_AMBULATORY_CARE_PROVIDER_SITE_OTHER): Payer: 59 | Admitting: Orthopaedic Surgery

## 2017-09-03 ENCOUNTER — Ambulatory Visit (INDEPENDENT_AMBULATORY_CARE_PROVIDER_SITE_OTHER): Payer: 59 | Admitting: Orthopaedic Surgery

## 2017-09-03 MED FILL — MELOXICAM 15 MG TABLET: 15 | 30 days supply | Qty: 30 | Fill #0

## 2017-09-03 MED FILL — LISINOPRIL 10 MG TABS: 10 | 90 days supply | Qty: 90 | Fill #1

## 2017-09-05 DIAGNOSIS — Z23 Encounter for immunization: Secondary | ICD-10-CM | POA: Diagnosis not present

## 2017-09-07 ENCOUNTER — Ambulatory Visit (INDEPENDENT_AMBULATORY_CARE_PROVIDER_SITE_OTHER): Payer: 59 | Admitting: Orthopaedic Surgery

## 2017-09-07 DIAGNOSIS — S83242A Other tear of medial meniscus, current injury, left knee, initial encounter: Secondary | ICD-10-CM

## 2017-09-07 NOTE — Progress Notes (Signed)
Office Visit Note   Patient: Kayla Marshall           Date of Birth: 1951/08/25           MRN: [id number] Visit Date: 09/07/2017              Requested by: No referring provider defined for this encounter. PCP: Vic Ripper, RN (Inactive)   Assessment & Plan: Visit Diagnoses:  1. Acute medial meniscus tear of left knee, initial encounter     Plan: Patient is doing well overall from her medial meniscal root tear.  I recommend that she continue to use the brace as needed.  Home exercise program was provided today.  All questions encouraged and answered.  Follow-up as needed. Total face to face encounter time was greater than 25 minutes and over half of this time was spent in counseling and/or coordination of care.  Follow-Up Instructions: Return if symptoms worsen or fail to improve.   Orders:  No orders of the defined types were placed in this encounter.  No orders of the defined types were placed in this encounter.     Procedures: No procedures performed   Clinical Data: No additional findings.   Subjective: No chief complaint on file.   Kayla Marshall follows up today for her left meniscal root tear.  She is feeling better overall.  She is wearing the knee brace when she is out in public.  Meloxicam is helping.  Overall she feels better today.    Review of Systems  Constitutional: Negative.   HENT: Negative.   Eyes: Negative.   Respiratory: Negative.   Cardiovascular: Negative.   Endocrine: Negative.   Musculoskeletal: Negative.   Neurological: Negative.   Hematological: Negative.   Psychiatric/Behavioral: Negative.   All other systems reviewed and are negative.    Objective: Vital Signs: There were no vitals taken for this visit.  Physical Exam  Constitutional: She is oriented to person, place, and time. She appears well-developed and well-nourished.  Pulmonary/Chest: Effort normal.  Neurological: She is alert and oriented to person, place, and time.    Skin: Skin is warm. Capillary refill takes less than 2 seconds.  Psychiatric: She has a normal mood and affect. Her behavior is normal. Judgment and thought content normal.  Nursing note and vitals reviewed.   Ortho Exam Left knee exam is stable. Specialty Comments:  No specialty comments available.  Imaging: No results found.   PMFS History: Patient Active Problem List   Diagnosis Date Noted  . Acute medial meniscus tear of left knee 07/30/2017  . OSA on CPAP 08/26/2015  . Palpitation 08/21/2012  . Unspecified hypothyroidism 08/21/2012   Past Medical History:  Diagnosis Date  . Anxiety   . Arthritis    back  . GERD (gastroesophageal reflux disease)   . Glaucoma   . Hypertension   . Hypothyroidism   . Obesity   . PPH (postpartum hemorrhage)   . Sciatic pain   . Sciatica   . Shortness of breath dyspnea   . Sleep apnea     No family history on file.  Past Surgical History:  Procedure Laterality Date  . CHOLECYSTECTOMY  1990  . DILATATION & CURETTAGE/HYSTEROSCOPY WITH MYOSURE N/A 11/12/2015   Procedure: DILATATION & CURETTAGE/HYSTEROSCOPY WITH  MYOSURE;  Surgeon: Thurnell Lose, MD;  Location: [facility];  Service: Gynecology;  Laterality: N/A;   Social History   Occupational History  . Not on file.   Social History Main Topics  .  Smoking status: Never Smoker  . Smokeless tobacco: Never Used  . Alcohol use Yes     Comment: occas  . Drug use: No  . Sexual activity: Yes    Birth control/ protection: Post-menopausal

## 2017-09-12 DIAGNOSIS — R7303 Prediabetes: Secondary | ICD-10-CM | POA: Diagnosis not present

## 2017-09-12 DIAGNOSIS — I10 Essential (primary) hypertension: Secondary | ICD-10-CM | POA: Diagnosis not present

## 2017-10-08 ENCOUNTER — Ambulatory Visit (INDEPENDENT_AMBULATORY_CARE_PROVIDER_SITE_OTHER): Payer: 59

## 2017-10-08 ENCOUNTER — Encounter (INDEPENDENT_AMBULATORY_CARE_PROVIDER_SITE_OTHER): Payer: Self-pay | Admitting: Orthopaedic Surgery

## 2017-10-08 ENCOUNTER — Ambulatory Visit (INDEPENDENT_AMBULATORY_CARE_PROVIDER_SITE_OTHER): Payer: 59 | Admitting: Orthopaedic Surgery

## 2017-10-08 DIAGNOSIS — M67442 Ganglion, left hand: Secondary | ICD-10-CM | POA: Diagnosis not present

## 2017-10-08 DIAGNOSIS — J069 Acute upper respiratory infection, unspecified: Secondary | ICD-10-CM | POA: Diagnosis not present

## 2017-10-08 DIAGNOSIS — J029 Acute pharyngitis, unspecified: Secondary | ICD-10-CM | POA: Diagnosis not present

## 2017-10-08 MED FILL — AMOXICILLIN 500 MG CAPSULE: 500 | 10 days supply | Qty: 40 | Fill #0

## 2017-10-08 NOTE — Progress Notes (Signed)
Office Visit Note   Patient: Kayla Marshall           Date of Birth: 1951-03-08           MRN: [id number] Visit Date: 10/08/2017              Requested by: No referring provider defined for this encounter. PCP: Vic Ripper, RN (Inactive)   Assessment & Plan: Visit Diagnoses:  1. Digital mucous cyst of finger of left hand     Plan: Impression is left middle finger digital mucous cyst.  Recommendation is for excision of the mucous cyst along with debridement of the DIP joint.  The cyst appears to be tenuous and at rest for deeper infection.  Patient wants to think about this and let us know.  She wants to get her certification and reflexology.  She will give [place] a call when she is ready for surgery.  We discussed the risk benefits alternatives of surgery and the details of the surgery  Follow-Up Instructions: Return if symptoms worsen or fail to improve.   Orders:  Orders Placed This Encounter  Procedures  . XR Finger Middle Left   No orders of the defined types were placed in this encounter.     Procedures: No procedures performed   Clinical Data: No additional findings.   Subjective: Chief Complaint  Patient presents with  . Left Hand - Pain    Patient is a 66 year old female comes in with 6-week history of enlargement over the long finger.  She denies any pain.  She has experienced drainage and redness.  She has been poking this with a needle on her own.  Denies any constitutional symptoms.    Review of Systems  Constitutional: Negative.   HENT: Negative.   Eyes: Negative.   Respiratory: Negative.   Cardiovascular: Negative.   Endocrine: Negative.   Musculoskeletal: Negative.   Neurological: Negative.   Hematological: Negative.   Psychiatric/Behavioral: Negative.   All other systems reviewed and are negative.    Objective: Vital Signs: There were no vitals taken for this visit.  Physical Exam  Constitutional: She is oriented to person, place,  and time. She appears well-developed and well-nourished.  Pulmonary/Chest: Effort normal.  Neurological: She is alert and oriented to person, place, and time.  Skin: Skin is warm. Capillary refill takes less than 2 seconds.  Psychiatric: She has a normal mood and affect. Her behavior is normal. Judgment and thought content normal.  Nursing note and vitals reviewed.   Ortho Exam Left hand exam shows a mucous cyst over the DIP joint of the left long finger.  There is no evidence of infection. Specialty Comments:  No specialty comments available.  Imaging: Xr Finger Middle Left  Result Date: 10/08/2017 Arthritis of the DIP joint    PMFS History: Patient Active Problem List   Diagnosis Date Noted  . Digital mucous cyst of finger of left hand 10/08/2017  . Acute medial meniscus tear of left knee 07/30/2017  . OSA on CPAP 08/26/2015  . Palpitation 08/21/2012  . Unspecified hypothyroidism 08/21/2012   Past Medical History:  Diagnosis Date  . Anxiety   . Arthritis    back  . GERD (gastroesophageal reflux disease)   . Glaucoma   . Hypertension   . Hypothyroidism   . Obesity   . PPH (postpartum hemorrhage)   . Sciatic pain   . Sciatica   . Shortness of breath dyspnea   . Sleep apnea  History reviewed. No pertinent family history.  Past Surgical History:  Procedure Laterality Date  . CHOLECYSTECTOMY  1990  . DILATATION & CURETTAGE/HYSTEROSCOPY WITH  MYOSURE N/A 11/12/2015   Performed by Thurnell Lose, MD at [facility]   Social History   Occupational History  . Not on file  Tobacco Use  . Smoking status: Never Smoker  . Smokeless tobacco: Never Used  Substance and Sexual Activity  . Alcohol use: Yes    Comment: occas  . Drug use: No  . Sexual activity: Yes    Birth control/protection: Post-menopausal

## 2017-10-12 MED FILL — LATANOPROST 0.005% EYE DRP: 0.005 | 75 days supply | Qty: 8 | Fill #1

## 2017-11-08 DIAGNOSIS — G4733 Obstructive sleep apnea (adult) (pediatric): Secondary | ICD-10-CM | POA: Diagnosis not present

## 2017-11-08 DIAGNOSIS — H409 Unspecified glaucoma: Secondary | ICD-10-CM | POA: Diagnosis not present

## 2017-11-08 DIAGNOSIS — I10 Essential (primary) hypertension: Secondary | ICD-10-CM | POA: Diagnosis not present

## 2017-11-08 DIAGNOSIS — Z0001 Encounter for general adult medical examination with abnormal findings: Secondary | ICD-10-CM | POA: Diagnosis not present

## 2017-11-08 DIAGNOSIS — M5430 Sciatica, unspecified side: Secondary | ICD-10-CM | POA: Diagnosis not present

## 2017-11-26 DIAGNOSIS — H6982 Other specified disorders of Eustachian tube, left ear: Secondary | ICD-10-CM | POA: Diagnosis not present

## 2017-11-26 DIAGNOSIS — I10 Essential (primary) hypertension: Secondary | ICD-10-CM | POA: Diagnosis not present

## 2017-11-26 DIAGNOSIS — G4733 Obstructive sleep apnea (adult) (pediatric): Secondary | ICD-10-CM | POA: Diagnosis not present

## 2017-11-26 DIAGNOSIS — F39 Unspecified mood [affective] disorder: Secondary | ICD-10-CM | POA: Diagnosis not present

## 2017-11-26 MED FILL — MOMETASONE FUROATE 50 MCG S: 50 | 30 days supply | Qty: 17 | Fill #0

## 2017-12-04 ENCOUNTER — Ambulatory Visit (INDEPENDENT_AMBULATORY_CARE_PROVIDER_SITE_OTHER): Payer: 59

## 2017-12-04 ENCOUNTER — Ambulatory Visit (INDEPENDENT_AMBULATORY_CARE_PROVIDER_SITE_OTHER): Payer: 59 | Admitting: Orthopaedic Surgery

## 2017-12-04 ENCOUNTER — Encounter (INDEPENDENT_AMBULATORY_CARE_PROVIDER_SITE_OTHER): Payer: Self-pay | Admitting: Orthopaedic Surgery

## 2017-12-04 DIAGNOSIS — M5416 Radiculopathy, lumbar region: Secondary | ICD-10-CM

## 2017-12-04 NOTE — Progress Notes (Signed)
Office Visit Note   Patient: Kayla Marshall           Date of Birth: 1951/01/06           MRN: [id number] Visit Date: 12/04/2017              Requested by: No referring provider defined for this encounter. PCP: Vic Ripper, RN (Inactive)   Assessment & Plan: Visit Diagnoses:  1. Radiculopathy, lumbar region     Plan: At this point we are going to try a course of Duexis.  If she is not any better in the next several weeks we will obtain a new MRI of her lumbar spine where we can potentially refer her for epidural steroid injections.  She will call and let us know.  Follow-Up Instructions: Return if symptoms worsen or fail to improve.   Orders:  Orders Placed This Encounter  Procedures  . XR Lumbar Spine 2-3 Views   No orders of the defined types were placed in this encounter.     Procedures: No procedures performed   Clinical Data: No additional findings.   Subjective: Chief Complaint  Patient presents with  . Lower Back - Pain    HPI Kayla Marshall is a pleasant 67 year old female who presents to our clinic today with lower back pain and bilateral lower extremity radiculopathy.  This is been ongoing for the past several years.  She had an MRI back in 2016 which showed transitional anatomy with partially sacralized L5.  She also had a grade 1 anterolisthesis at L4-5 and a small disc herniation at L2-3.  She has never had epidural steroid injections.  She has been seen in the past by her chiropractor where she gets regular adjustments.  These seem to help quite a bit however she was unable to get in over the holidays and now is in a "jam".  The pain she gets is in the lumbosacral region on both sides.  She does have pain that radiates down the back of both legs.  She has tried multiple anti-inflammatories with minimal to no relief of symptoms.  Of note, she was initially given a steroid Dosepak which gave her severe anxietyOccasional numbness tingling to the feet.  No saddle  paresthesia.  No bowel or bladder incontinence.  .  Review of Systems as detailed in HPI.  All others reviewed and are negative.  Objective: Vital Signs: There were no vitals taken for this visit.  Physical Exam well-developed well-nourished female in no acute distress.  Alert and oriented x3.  Ortho Exam examination of her back reveals moderate tenderness over the lumbosacral region both sides.  Positive straight leg raise bilaterally.  Negative logroll.  She is neurovascular intact distally.  Specialty Comments:  No specialty comments available.  Imaging: Xr Lumbar Spine 2-3 Views  Result Date: 12/04/2017 X-rays of the lumbar spine from today reveal a partially sacralized L5.  Also noted is an anterolisthesis at L4-5.    PMFS History: Patient Active Problem List   Diagnosis Date Noted  . Digital mucous cyst of finger of left hand 10/08/2017  . Acute medial meniscus tear of left knee 07/30/2017  . OSA on CPAP 08/26/2015  . Palpitation 08/21/2012  . Unspecified hypothyroidism 08/21/2012   Past Medical History:  Diagnosis Date  . Anxiety   . Arthritis    back  . GERD (gastroesophageal reflux disease)   . Glaucoma   . Hypertension   . Hypothyroidism   . Obesity   .  PPH (postpartum hemorrhage)   . Sciatic pain   . Sciatica   . Shortness of breath dyspnea   . Sleep apnea     History reviewed. No pertinent family history.  Past Surgical History:  Procedure Laterality Date  . CHOLECYSTECTOMY  1990  . DILATATION & CURETTAGE/HYSTEROSCOPY WITH MYOSURE N/A 11/12/2015   Procedure: DILATATION & CURETTAGE/HYSTEROSCOPY WITH  MYOSURE;  Surgeon: Thurnell Lose, MD;  Location: [facility];  Service: Gynecology;  Laterality: N/A;   Social History   Occupational History  . Not on file  Tobacco Use  . Smoking status: Never Smoker  . Smokeless tobacco: Never Used  Substance and Sexual Activity  . Alcohol use: Yes    Comment: occas  . Drug use: No  . Sexual activity: Yes     Birth control/protection: Post-menopausal

## 2017-12-06 ENCOUNTER — Encounter: Payer: Self-pay | Admitting: Physical Therapy

## 2017-12-06 ENCOUNTER — Other Ambulatory Visit: Payer: Self-pay

## 2017-12-06 ENCOUNTER — Ambulatory Visit: Payer: 59 | Attending: Orthopaedic Surgery | Admitting: Physical Therapy

## 2017-12-06 DIAGNOSIS — M6281 Muscle weakness (generalized): Secondary | ICD-10-CM | POA: Insufficient documentation

## 2017-12-06 DIAGNOSIS — G8929 Other chronic pain: Secondary | ICD-10-CM | POA: Insufficient documentation

## 2017-12-06 DIAGNOSIS — M545 Low back pain: Secondary | ICD-10-CM | POA: Insufficient documentation

## 2017-12-06 DIAGNOSIS — R293 Abnormal posture: Secondary | ICD-10-CM | POA: Diagnosis not present
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
## 2017-12-07 DIAGNOSIS — S83207D Unspecified tear of unspecified meniscus, current injury, left knee, subsequent encounter: Secondary | ICD-10-CM | POA: Diagnosis not present

## 2017-12-07 DIAGNOSIS — R42 Dizziness and giddiness: Secondary | ICD-10-CM | POA: Diagnosis not present

## 2017-12-07 DIAGNOSIS — M5136 Other intervertebral disc degeneration, lumbar region: Secondary | ICD-10-CM | POA: Diagnosis not present

## 2017-12-07 DIAGNOSIS — Z658 Other specified problems related to psychosocial circumstances: Secondary | ICD-10-CM | POA: Diagnosis not present

## 2017-12-07 MED FILL — LISINOPRIL 10 MG TABS: 10 | 90 days supply | Qty: 90 | Fill #0

## 2017-12-10 ENCOUNTER — Other Ambulatory Visit (INDEPENDENT_AMBULATORY_CARE_PROVIDER_SITE_OTHER): Payer: Self-pay | Admitting: Orthopaedic Surgery

## 2017-12-10 MED ORDER — IBUPROFEN 800 MG PO TABS: 800.0000 mg | ORAL_TABLET | Freq: Three times a day (TID) | ORAL | 3 refills | Status: DC

## 2017-12-10 MED FILL — IBUPROFEN 800 MG TAB: 800 | 20 days supply | Qty: 60 | Fill #0

## 2017-12-10 NOTE — Telephone Encounter (Signed)
We don't have anymore samples. We can just send in ibuprofen 800 mg TID prn.  #60.  3 refills

## 2017-12-10 NOTE — Telephone Encounter (Signed)
Please advise 

## 2017-12-10 NOTE — Telephone Encounter (Signed)
Patient states she was given samples of Duexis at her last appt for her back pain and is wondering if she could receive more, due to the fact that it is $1000+ if she were to get the RX filled. If samples can not be given, she requests for 800 mg Ibuprofen to be called in to [facility]. She would like a call back once its called in or if she can pick up samples. Please advise # [phone]

## 2017-12-10 NOTE — Telephone Encounter (Signed)
Rx sent, IC pt LMVM advised

## 2017-12-20 ENCOUNTER — Ambulatory Visit: Payer: 59 | Admitting: Physical Therapy

## 2017-12-24 ENCOUNTER — Ambulatory Visit: Payer: 59 | Attending: Orthopaedic Surgery | Admitting: Physical Therapy

## 2017-12-24 ENCOUNTER — Telehealth: Payer: Self-pay | Admitting: Physical Therapy

## 2017-12-24 DIAGNOSIS — R293 Abnormal posture: Secondary | ICD-10-CM | POA: Insufficient documentation

## 2017-12-24 DIAGNOSIS — M545 Low back pain: Secondary | ICD-10-CM | POA: Insufficient documentation

## 2017-12-24 DIAGNOSIS — M6281 Muscle weakness (generalized): Secondary | ICD-10-CM | POA: Insufficient documentation

## 2017-12-24 DIAGNOSIS — G8929 Other chronic pain: Secondary | ICD-10-CM | POA: Insufficient documentation

## 2017-12-24 MED FILL — LATANOPROST 0.005% EYE DRP: 0.005 | 75 days supply | Qty: 8 | Fill #2

## 2017-12-24 NOTE — Telephone Encounter (Signed)
Left voicemail regarding today's missed appointment. Stated when her next appointment is, and if she cannot attend the visit then we can cancel or re-schedule the visit. Noted about our attendance policy.

## 2017-12-27 ENCOUNTER — Ambulatory Visit: Payer: 59 | Admitting: Physical Therapy

## 2017-12-27 DIAGNOSIS — M545 Low back pain: Secondary | ICD-10-CM | POA: Diagnosis not present

## 2017-12-27 DIAGNOSIS — R293 Abnormal posture: Secondary | ICD-10-CM | POA: Diagnosis not present

## 2017-12-27 DIAGNOSIS — G8929 Other chronic pain: Secondary | ICD-10-CM

## 2017-12-27 DIAGNOSIS — M6281 Muscle weakness (generalized): Secondary | ICD-10-CM | POA: Diagnosis not present

## 2017-12-27 NOTE — Therapy (Signed)
[place], [place], [address] Phone: [phone]   Fax:  [phone]  Physical Therapy Treatment  Patient Details  Name: Kayla Marshall MRN: [id number] Date of Birth: May 16, 1951 Referring Provider: Frankey Shown MD   Encounter Date: 12/27/2017  PT End of Session - 12/27/17 1648    Visit Number  2    Number of Visits  13    Date for PT Re-Evaluation  01/17/18    PT Start Time  0109    PT Stop Time  1628    PT Time Calculation (min)  42 min    Activity Tolerance  Patient tolerated treatment well    Behavior During Therapy  [facility] for tasks assessed/performed       Past Medical History:  Diagnosis Date  . Anxiety   . Arthritis    back  . GERD (gastroesophageal reflux disease)   . Glaucoma   . Hypertension   . Hypothyroidism   . Obesity   . PPH (postpartum hemorrhage)   . Sciatic pain   . Sciatica   . Shortness of breath dyspnea   . Sleep apnea     Past Surgical History:  Procedure Laterality Date  . CHOLECYSTECTOMY  1990  . DILATATION & CURETTAGE/HYSTEROSCOPY WITH MYOSURE N/A 11/12/2015   Procedure: DILATATION & CURETTAGE/HYSTEROSCOPY WITH  MYOSURE;  Surgeon: Thurnell Lose, MD;  Location: [facility];  Service: Gynecology;  Laterality: N/A;    There were no vitals filed for this visit.  Subjective Assessment - 12/27/17 1549    Subjective  "I've been going [facility] therapy and homeopathic doctor because I am determined to get better"    Currently in Pain?  Yes    Pain Score  0-No pain    Pain Orientation  Right    Pain Onset  More than a month ago    Pain Frequency  Intermittent    Aggravating Factors    bending forward, going up a step ladder    Pain Relieving Factors  heating, ice, medication.         [facility] PT Assessment - 12/27/17 1554      Strength   Right/Left Hip  Right;Left    Right Hip Flexion  4/5    Right Hip Extension  4-/5    Right Hip ABduction  3+/5    Left Hip Flexion  4/5    Left Hip Extension  4-/5    Left Hip ABduction  4-/5    Right/Left Knee  Right;Left    Right Knee Flexion  4+/5 increased pain in the R low back     Right Knee Extension  4+/5    Left Knee Flexion  4+/5    Left Knee Extension  4+/5      Special Tests    Special Tests  Sacrolliac Tests    Sacroiliac Tests   Gaenslen's Test      Sacral thrust    Findings  Positive    Side  Right      Gaenslen's test   Findings  Positive    Side   Right                  [facility] Adult PT Treatment/Exercise - 12/27/17 1649      Lumbar Exercises: Stretches   Active Hamstring Stretch  2 reps;Left;Right;30 seconds    Other Lumbar Stretch Exercise  adductor stretching stretching 2 x 30       Lumbar Exercises: Supine  Pelvic Tilt  10 reps;5 seconds    Bent Knee Raise  10 reps;2 seconds             PT Education - 12/27/17 1645    Education provided  Yes    Education Details  handout to update HEP for hip abduction strength and adductor stretching. Answered pt questions regarding posture/ lifting form. anatomy ofthe SIJ and effects of muscles on the hips.     Person(s) Educated  Patient    Methods  Explanation;Verbal cues;Handout    Comprehension  Verbalized understanding;Verbal cues required       PT Short Term Goals - 12/06/17 1251      PT SHORT TERM GOAL #1   Title  pt to be I with initial HEP    Time  3    Period  Weeks    Status  New    Target Date  12/27/17      PT SHORT TERM GOAL #2   Title  pt to verbalize and demo proper posture with sitting/ standing and utilize proper mechanics with lifting activities to prevent and reduce low back pain    Time  3    Period  Weeks    Status  New    Target Date  12/27/17      PT SHORT TERM GOAL #3   Title  pt to reduce paraspinal muscle spasm to promote trunk mobility and reduce pain    Time  3    Period  Weeks    Status  New    Target Date  12/27/17        PT Long Term Goals - 12/27/17 1727      PT LONG TERM GOAL #1    Title  increase trunk flexion to >/= 70 degrees and extension / bil side bending by >/= 20 degrees with </= 2/10 pain to promote functional mobility required for ADLs     Time  6    Period  Weeks    Status  On-going      PT LONG TERM GOAL #2   Title  pt to be able to sit/ stand and walk for >/= 60 min with </= 2/10 pain for functional endurance required for ADLs and work related activites    Time  6    Period  Weeks    Status  On-going      PT LONG TERM GOAL #3   Title  pt to increase FOTO score to </= 50% limited to demo improvement in function    Time  6    Period  Weeks    Status  On-going      PT LONG TERM GOAL #4   Title  pt to be I with all HEP given as of last visit    Time  6    Period  Weeks    Status  On-going      PT LONG TERM GOAL #5   Title  improve hip strength to >/= 4+/5 in all planes to provide hip stability with walking/ standing activities     Time  6    Period  Weeks    Status  New    Target Date  02/07/18            Plan - 12/27/17 1720    Clinical Impression Statement  Further assessment revealed possible outflare of the R innominate. and weakness in bil hips. focused on stretching for bil adductors and strengthening of the  R hip abductors which she perofrmed well. pt had multiple questions regarding posture with acitivty and that she has been "Rolfing" which has helped with her pain in theback.     PT Treatment/Interventions  ADLs/Self Care Home Management;Electrical Stimulation;Cryotherapy;Iontophoresis 4mg /ml Dexamethasone;Moist Heat;Ultrasound;Neuromuscular re-education;Functional mobility training;Therapeutic activities;Therapeutic exercise;Manual techniques;Dry needling;Taping;Patient/family education    PT Next Visit Plan  review and update HEP,  potential outflare of the R innominate stretching of addcutors, and hip abductor and core strengthening/ hip stretching, modalities PRN     PT Home Exercise Plan  lower trunk rotation, posterior pelvic  tilt, hamstring stretching, clams, adductor stretch/ sidelying hip abduction    Consulted and Agree with Plan of Care  Patient       Patient will benefit from skilled therapeutic intervention in order to improve the following deficits and impairments:  Abnormal gait, Pain, Difficulty walking, Decreased strength, Postural dysfunction, Improper body mechanics, Decreased balance, Decreased activity tolerance, Increased fascial restricitons, Increased muscle spasms, Hypomobility, Obesity  Visit Diagnosis: Chronic bilateral low back pain, with sciatica presence unspecified  Abnormal posture  Muscle weakness (generalized)     Problem List Patient Active Problem List   Diagnosis Date Noted  . Digital mucous cyst of finger of left hand 10/08/2017  . Acute medial meniscus tear of left knee 07/30/2017  . OSA on CPAP 08/26/2015  . Palpitation 08/21/2012  . Unspecified hypothyroidism 08/21/2012   Starr Lake PT, DPT, LAT, ATC  12/27/17  5:29 PM      [facility] [address] [place], [place], [address] Phone: [phone]   Fax:  [phone]  Name: Kayla Marshall MRN: [id number] Date of Birth: 24-Dec-1950

## 2017-12-31 ENCOUNTER — Encounter: Payer: Self-pay | Admitting: Physical Therapy

## 2017-12-31 ENCOUNTER — Ambulatory Visit: Payer: 59 | Admitting: Physical Therapy

## 2017-12-31 DIAGNOSIS — R293 Abnormal posture: Secondary | ICD-10-CM | POA: Diagnosis not present

## 2017-12-31 DIAGNOSIS — M6281 Muscle weakness (generalized): Secondary | ICD-10-CM | POA: Diagnosis not present

## 2017-12-31 DIAGNOSIS — G8929 Other chronic pain: Secondary | ICD-10-CM

## 2017-12-31 DIAGNOSIS — M545 Low back pain: Principal | ICD-10-CM

## 2017-12-31 NOTE — Therapy (Signed)
[place], [place], [address] Phone: [phone]   Fax:  [phone]  Physical Therapy Treatment  Patient Details  Name: Kayla Marshall MRN: [id number] Date of Birth: 1950/11/30 Referring Provider: Frankey Shown MD   Encounter Date: 12/31/2017  PT End of Session - 12/31/17 1631    Visit Number  3    Number of Visits  13    Date for PT Re-Evaluation  01/17/18    PT Start Time  1550    PT Stop Time  1628    PT Time Calculation (min)  38 min    Activity Tolerance  Patient tolerated treatment well    Behavior During Therapy  [facility] for tasks assessed/performed       Past Medical History:  Diagnosis Date  . Anxiety   . Arthritis    back  . GERD (gastroesophageal reflux disease)   . Glaucoma   . Hypertension   . Hypothyroidism   . Obesity   . PPH (postpartum hemorrhage)   . Sciatic pain   . Sciatica   . Shortness of breath dyspnea   . Sleep apnea     Past Surgical History:  Procedure Laterality Date  . CHOLECYSTECTOMY  1990  . DILATATION & CURETTAGE/HYSTEROSCOPY WITH MYOSURE N/A 11/12/2015   Procedure: DILATATION & CURETTAGE/HYSTEROSCOPY WITH  MYOSURE;  Surgeon: Thurnell Lose, MD;  Location: [facility];  Service: Gynecology;  Laterality: N/A;    There were no vitals filed for this visit.  Subjective Assessment - 12/31/17 1551    Subjective  "i've been pretty tight, I am unsure if i am doing my exercises correctly or not.     Currently in Pain?  Yes    Pain Score  7     Pain Orientation  Right    Pain Descriptors / Indicators  Aching;Sore    Pain Type  Chronic pain    Pain Frequency  Intermittent                      [facility] Adult PT Treatment/Exercise - 12/31/17 1616      Lumbar Exercises: Stretches   Active Hamstring Stretch  2 reps;30 seconds;Right;Left    Other Lumbar Stretch Exercise  butterfly stretching 4 x 30 sec 2 more sets following MTPR       Lumbar Exercises: Seated   Sit to Stand  10 reps x 2 sets    Sit to Stand Limitations  with green theraband around the knees for glute med activation using hands on knees to assist with pushing up      Knee/Hip Exercises: Sidelying   Hip ABduction  3 sets;10 reps;Right    Hip ABduction Limitations  verbal cues for controlled eccentric and just tapping foot down before raising it back up      Manual Therapy   Manual Therapy  Joint mobilization    Manual therapy comments  MTPR along the R adductor longus x 3    Joint Mobilization  innominate grade 3 compression to promote inflarred position               PT Short Term Goals - 12/06/17 1251      PT SHORT TERM GOAL #1   Title  pt to be I with initial HEP    Time  3    Period  Weeks    Status  New    Target Date  12/27/17      PT SHORT  TERM GOAL #2   Title  pt to verbalize and demo proper posture with sitting/ standing and utilize proper mechanics with lifting activities to prevent and reduce low back pain    Time  3    Period  Weeks    Status  New    Target Date  12/27/17      PT SHORT TERM GOAL #3   Title  pt to reduce paraspinal muscle spasm to promote trunk mobility and reduce pain    Time  3    Period  Weeks    Status  New    Target Date  12/27/17        PT Long Term Goals - 12/27/17 1727      PT LONG TERM GOAL #1   Title  increase trunk flexion to >/= 70 degrees and extension / bil side bending by >/= 20 degrees with </= 2/10 pain to promote functional mobility required for ADLs     Time  6    Period  Weeks    Status  On-going      PT LONG TERM GOAL #2   Title  pt to be able to sit/ stand and walk for >/= 60 min with </= 2/10 pain for functional endurance required for ADLs and work related activites    Time  6    Period  Weeks    Status  On-going      PT LONG TERM GOAL #3   Title  pt to increase FOTO score to </= 50% limited to demo improvement in function    Time  6    Period  Weeks    Status  On-going      PT LONG TERM  GOAL #4   Title  pt to be I with all HEP given as of last visit    Time  6    Period  Weeks    Status  On-going      PT LONG TERM GOAL #5   Title  improve hip strength to >/= 4+/5 in all planes to provide hip stability with walking/ standing activities     Time  6    Period  Weeks    Status  New    Target Date  02/07/18            Plan - 12/31/17 1632    Clinical Impression Statement  pt reported feeling like she is a 7/10 today in the low back. pt continues to require verbal cues for proper form with previously provided HEP. Manual to release adductor tightness and strengthening of the glute med. utilized L sidelyingmob to correct out flare which she reported no pain. post session she reported feeling the pain dropped to 3/10.     PT Next Visit Plan  review   potential outflare of the R innominate stretching of addcutors, and hip abductor and core strengthening/ hip stretching, modalities PRN , update hep for standing hip strength if tolerated     PT Home Exercise Plan  lower trunk rotation, posterior pelvic tilt, hamstring stretching, clams, adductor stretch/ sidelying hip abduction    Consulted and Agree with Plan of Care  Patient       Patient will benefit from skilled therapeutic intervention in order to improve the following deficits and impairments:  Abnormal gait, Pain, Difficulty walking, Decreased strength, Postural dysfunction, Improper body mechanics, Decreased balance, Decreased activity tolerance, Increased fascial restricitons, Increased muscle spasms, Hypomobility, Obesity  Visit Diagnosis: Chronic bilateral low  back pain, with sciatica presence unspecified  Abnormal posture  Muscle weakness (generalized)     Problem List Patient Active Problem List   Diagnosis Date Noted  . Digital mucous cyst of finger of left hand 10/08/2017  . Acute medial meniscus tear of left knee 07/30/2017  . OSA on CPAP 08/26/2015  . Palpitation 08/21/2012  . Unspecified  hypothyroidism 08/21/2012   Starr Lake PT, DPT, LAT, ATC  12/31/17  4:35 PM      [facility] [address] [place], [place], [address] Phone: [phone]   Fax:  [phone]  Name: LEOMIA BLAKE MRN: [id number] Date of Birth: Apr 17, 1951

## 2018-01-03 ENCOUNTER — Ambulatory Visit: Payer: 59 | Admitting: Physical Therapy

## 2018-01-07 ENCOUNTER — Encounter: Payer: Self-pay | Admitting: Physical Therapy

## 2018-01-07 ENCOUNTER — Ambulatory Visit: Payer: 59 | Admitting: Physical Therapy

## 2018-01-07 DIAGNOSIS — G8929 Other chronic pain: Secondary | ICD-10-CM | POA: Diagnosis not present

## 2018-01-07 DIAGNOSIS — R293 Abnormal posture: Secondary | ICD-10-CM | POA: Diagnosis not present

## 2018-01-07 DIAGNOSIS — M6281 Muscle weakness (generalized): Secondary | ICD-10-CM | POA: Diagnosis not present

## 2018-01-07 DIAGNOSIS — M545 Low back pain: Secondary | ICD-10-CM | POA: Diagnosis not present

## 2018-01-07 NOTE — Therapy (Signed)
[place], [place], [address] Phone: [phone]   Fax:  [phone]  Physical Therapy Treatment  Patient Details  Name: Kayla Marshall MRN: [id number] Date of Birth: 07-06-1951 Referring Provider: Frankey Shown MD   Encounter Date: 01/07/2018  PT End of Session - 01/07/18 1740    Visit Number  4    Number of Visits  13    Date for PT Re-Evaluation  01/17/18    PT Start Time  0177    PT Stop Time  1632    PT Time Calculation (min)  42 min    Activity Tolerance  Patient tolerated treatment well       Past Medical History:  Diagnosis Date  . Anxiety   . Arthritis    back  . GERD (gastroesophageal reflux disease)   . Glaucoma   . Hypertension   . Hypothyroidism   . Obesity   . PPH (postpartum hemorrhage)   . Sciatic pain   . Sciatica   . Shortness of breath dyspnea   . Sleep apnea     Past Surgical History:  Procedure Laterality Date  . CHOLECYSTECTOMY  1990  . DILATATION & CURETTAGE/HYSTEROSCOPY WITH MYOSURE N/A 11/12/2015   Procedure: DILATATION & CURETTAGE/HYSTEROSCOPY WITH  MYOSURE;  Surgeon: Thurnell Lose, MD;  Location: [facility];  Service: Gynecology;  Laterality: N/A;    There were no vitals filed for this visit.  Subjective Assessment - 01/07/18 1553    Subjective  "I've been more stressed today, from work and I lost my car keys and it has been really getting to me"     Currently in Pain?  Yes    Pain Score  2     Pain Location  Back    Pain Orientation  Right    Pain Type  Chronic pain    Aggravating Factors   bending, forward, going up a step ladder    Pain Relieving Factors  heating                      [facility] Adult PT Treatment/Exercise - 01/07/18 1626      Lumbar Exercises: Aerobic   Nustep  L5 x 5 min LE only      Lumbar Exercises: Supine   Bridge  10 reps;2 seconds with glute squeeze      Knee/Hip Exercises: Standing   Gait Training  heel strike and toe  off keep feet a little less than shoulder width apart 3 x 45 ft, cues for reciprocal arm swing      Knee/Hip Exercises: Sidelying   Hip ABduction  2 sets;15 reps      Manual Therapy   Manual therapy comments  MTPR along the R glute medius x 3, DTM along the R glute med             PT Education - 01/07/18 1739    Education provided  Yes    Education Details  proper gait pattern and benefits in regard to energy efficiency    Person(s) Educated  Patient    Methods  Explanation;Verbal cues;Demonstration    Comprehension  Verbalized understanding;Verbal cues required;Returned demonstration       PT Short Term Goals - 12/06/17 1251      PT SHORT TERM GOAL #1   Title  pt to be I with initial HEP    Time  3    Period  Weeks    Status  New    Target Date  12/27/17      PT SHORT TERM GOAL #2   Title  pt to verbalize and demo proper posture with sitting/ standing and utilize proper mechanics with lifting activities to prevent and reduce low back pain    Time  3    Period  Weeks    Status  New    Target Date  12/27/17      PT SHORT TERM GOAL #3   Title  pt to reduce paraspinal muscle spasm to promote trunk mobility and reduce pain    Time  3    Period  Weeks    Status  New    Target Date  12/27/17        PT Long Term Goals - 12/27/17 1727      PT LONG TERM GOAL #1   Title  increase trunk flexion to >/= 70 degrees and extension / bil side bending by >/= 20 degrees with </= 2/10 pain to promote functional mobility required for ADLs     Time  6    Period  Weeks    Status  On-going      PT LONG TERM GOAL #2   Title  pt to be able to sit/ stand and walk for >/= 60 min with </= 2/10 pain for functional endurance required for ADLs and work related activites    Time  6    Period  Weeks    Status  On-going      PT LONG TERM GOAL #3   Title  pt to increase FOTO score to </= 50% limited to demo improvement in function    Time  6    Period  Weeks    Status  On-going       PT LONG TERM GOAL #4   Title  pt to be I with all HEP given as of last visit    Time  6    Period  Weeks    Status  On-going      PT LONG TERM GOAL #5   Title  improve hip strength to >/= 4+/5 in all planes to provide hip stability with walking/ standing activities     Time  6    Period  Weeks    Status  New    Target Date  02/07/18            Plan - 01/07/18 1740    Clinical Impression Statement  pt reports she has increase stress today but stated she only had 2/10 pain in the hip. manual techniques to calm down glute med tightness, and gait training to promote heel strike/ toe off and prevent lateral shifting gait. pt continues to require time for explanation and reassurance with exercise. post session she reported no increase in pain.     PT Next Visit Plan  review   potential outflare of the R innominate stretching of addcutors, and hip abductor and core strengthening/ hip stretching, modalities PRN , update hep for standing hip strength if tolerated     PT Home Exercise Plan  lower trunk rotation, posterior pelvic tilt, hamstring stretching, clams, adductor stretch/ sidelying hip abduction    Consulted and Agree with Plan of Care  Patient       Patient will benefit from skilled therapeutic intervention in order to improve the following deficits and impairments:     Visit Diagnosis: Chronic bilateral low back pain, with sciatica presence unspecified  Abnormal posture  Muscle weakness (generalized)     Problem List Patient Active Problem List   Diagnosis Date Noted  . Digital mucous cyst of finger of left hand 10/08/2017  . Acute medial meniscus tear of left knee 07/30/2017  . OSA on CPAP 08/26/2015  . Palpitation 08/21/2012  . Unspecified hypothyroidism 08/21/2012   Starr Lake PT, DPT, LAT, ATC  01/07/18  5:43 PM      [facility] [address] [place], [place], [address] Phone: [phone]    Fax:  [phone]  Name: DORISANN SCHWANKE MRN: [id number] Date of Birth: 06/16/1951

## 2018-01-10 ENCOUNTER — Ambulatory Visit: Payer: 59 | Admitting: Physical Therapy

## 2018-01-14 ENCOUNTER — Ambulatory Visit: Payer: 59 | Admitting: Physical Therapy

## 2018-01-14 ENCOUNTER — Encounter: Payer: Self-pay | Admitting: Physical Therapy

## 2018-01-14 DIAGNOSIS — M6281 Muscle weakness (generalized): Secondary | ICD-10-CM | POA: Diagnosis not present

## 2018-01-14 DIAGNOSIS — R293 Abnormal posture: Secondary | ICD-10-CM | POA: Diagnosis not present

## 2018-01-14 DIAGNOSIS — M545 Low back pain: Principal | ICD-10-CM

## 2018-01-14 DIAGNOSIS — G8929 Other chronic pain: Secondary | ICD-10-CM | POA: Diagnosis not present

## 2018-01-14 NOTE — Patient Instructions (Addendum)
   PELVIC TILT  Lie on back, legs bent. Exhale, tilting top of pelvis back, pubic bone up, to flatten lower back. Inhale, rolling pelvis opposite way, top forward, pubic bone down, arch in back. Repeat __10__ times. Do __2__ sessions per day. Copyright  VHI. All rights reserved.    Isometric Hold With Pelvic Floor (Hook-Lying)  Lie with hips and knees bent. Slowly inhale, and then exhale. Pull navel toward spine and tighten pelvic floor. Hold for __10_ seconds. Continue to breathe in and out during hold. Rest for _10__ seconds. Repeat __10_ times. Do __2-3_ times a day.      Copyright  VHI. All rights reserved.   Abduction: Clam (Eccentric) - Side-Lying   Lie on side with knees bent. Lift top knee, keeping feet together. Keep trunk steady. Slowly lower for 3-5 seconds. _10__ reps per set, _2__ sets per day, ___ days per week. Add ___ lbs when you achieve ___ repetitions.  HIP: Abduction / External Rotation (Band)    Bridge   Lie back, legs bent. Inhale, pressing hips up. Keeping ribs in, lengthen lower back. Exhale, rolling down along spine from top. Repeat __10 x 2__ times. Do __1_ sessions per day. Hip extension strength    Copyright  VHI. All rights reserved. Knee to Chest (Flexion)   Pull knee toward chest. Feel stretch in lower back or buttock area. Breathing deeply, Hold _5___ seconds. Repeat with other knee. Repeat __5__ times. Do _1___ sessions per day.  At work you can do in sitting position to prevent tingling.   Hip Stretch  Put right ankle over left knee. Let right knee fall downward, but keep ankle in place. Feel the stretch in hip. May push down gently with hand to feel stretch. Hold ____ seconds while counting out loud. Repeat with other leg. Repeat _2-_3_ times.  30 seconds each  Do _2-3_ sessions per day.    Stretching: Piriformis (Supine)  Pull right knee toward opposite shoulder. Hold _30___ seconds. Relax. Repeat __2-3__ times per set. Do _1___  sets per session. Do ___2-3_ sessions per day.          Copyright  VHI. All rights reserved.  Supine: Leg Stretch With Strap (Basic)   Lie on back with one knee bent, foot flat on floor. Hook strap around other foot. Straighten knee. Keep knee level with other knee. Hold _30__ seconds. Relax leg completely down to floor.  Repeat 3___ times per session. Do _1-2__ sessions per day.  Copyright  VHI. All rights reserved.             Voncille Lo, PT Certified Exercise Expert for the Aging Adult  01/14/18 4:18 PM Phone: [phone] Fax: [phone]

## 2018-01-14 NOTE — Therapy (Signed)
[place], [place], [address] Phone: [phone]   Fax:  [phone]  Physical Therapy Treatment  Patient Details  Name: Kayla Marshall MRN: [id number] Date of Birth: 1951-05-11 Referring Provider: Frankey Shown MD   Encounter Date: 01/14/2018  PT End of Session - 01/14/18 1759    Visit Number  5    Number of Visits  13    Date for PT Re-Evaluation  01/17/18    PT Start Time  2671    PT Stop Time  1638    PT Time Calculation (min)  51 min    Activity Tolerance  Patient tolerated treatment well    Behavior During Therapy  [facility] for tasks assessed/performed       Past Medical History:  Diagnosis Date  . Anxiety   . Arthritis    back  . GERD (gastroesophageal reflux disease)   . Glaucoma   . Hypertension   . Hypothyroidism   . Obesity   . PPH (postpartum hemorrhage)   . Sciatic pain   . Sciatica   . Shortness of breath dyspnea   . Sleep apnea     Past Surgical History:  Procedure Laterality Date  . CHOLECYSTECTOMY  1990  . DILATATION & CURETTAGE/HYSTEROSCOPY WITH MYOSURE N/A 11/12/2015   Procedure: DILATATION & CURETTAGE/HYSTEROSCOPY WITH  MYOSURE;  Surgeon: Thurnell Lose, MD;  Location: [facility];  Service: Gynecology;  Laterality: N/A;    There were no vitals filed for this visit.  Subjective Assessment - 01/14/18 1553    Subjective  I am stressed about many things . I feel pressure in my chest.  ( hiatal hernia) Pt reports lifting and bending in her apartment.  I walk for 12 hours on concrete. I feel tingling in my feet and Ihavent done my exercises consistently.  I am going to a [employer] on Saturday    Limitations  Standing;Lifting;House hold activities    Patient Stated Goals  to be pain free, to improve flexibilty and get core stronger,    Currently in Pain?  Yes    Pain Score  3     Pain Location  Back    Pain Orientation  Right    Pain Descriptors / Indicators  Tingling;Aching;Sore  tingling in feet    Pain Type  Chronic pain    Pain Onset  More than a month ago    Pain Frequency  Intermittent                      [facility] Adult PT Treatment/Exercise - 01/14/18 1557      Self-Care   Self-Care  Other Self-Care Comments    Other Self-Care Comments   education on need for stretch, Hypomobility and benefits of movement.  DOMS vs radicular pain      Lumbar Exercises: Stretches   Active Hamstring Stretch  2 reps;30 seconds;Right;Left    Single Knee to Chest Stretch  5 reps;10 seconds    Piriformis Stretch  2 reps;30 seconds supine and sitting 2 x 30 sec stretch bil      Lumbar Exercises: Standing   Other Standing Lumbar Exercises  isometric hip abd against wall x 10 10 sec hold      Lumbar Exercises: Seated   Sit to Stand  10 reps x 2 sets holding on to chair      Lumbar Exercises: Supine   Pelvic Tilt  10 reps;5 seconds    Bent Knee Raise  10 reps;2 seconds Pt complained of increased pain in right leg and tingling.DC    Bridge  10 reps;2 seconds with glute squeeze      Knee/Hip Exercises: Sidelying   Hip ABduction  2 sets;15 reps VC and TC for cues Pt compensates with hip flexors    Other Sidelying Knee/Hip Exercises  eccentric clams 10 reps x 2      Manual Therapy   Manual therapy comments  --             PT Education - 01/14/18 1550    Education provided  Yes    Education Details  Pt added to HEP for stretches and minimal strengthening    Person(s) Educated  Patient    Methods  Explanation;Demonstration;Verbal cues;Tactile cues;Handout    Comprehension  Verbalized understanding;Returned demonstration       PT Short Term Goals - 01/14/18 1803      PT SHORT TERM GOAL #1   Title  pt to be I with initial HEP    Baseline  Pt given intial HEP    Time  3    Period  Weeks    Status  On-going      PT SHORT TERM GOAL #2   Title  pt to verbalize and demo proper posture with sitting/ standing and utilize proper mechanics with lifting  activities to prevent and reduce low back pain    Baseline  intial education begun    Time  3    Period  Weeks    Status  On-going      PT SHORT TERM GOAL #3   Title  pt to reduce paraspinal muscle spasm to promote trunk mobility and reduce pain    Baseline  Pt pain 3/10 today    Time  3    Period  Weeks    Status  On-going        PT Long Term Goals - 12/27/17 1727      PT LONG TERM GOAL #1   Title  increase trunk flexion to >/= 70 degrees and extension / bil side bending by >/= 20 degrees with </= 2/10 pain to promote functional mobility required for ADLs     Time  6    Period  Weeks    Status  On-going      PT LONG TERM GOAL #2   Title  pt to be able to sit/ stand and walk for >/= 60 min with </= 2/10 pain for functional endurance required for ADLs and work related activites    Time  6    Period  Weeks    Status  On-going      PT LONG TERM GOAL #3   Title  pt to increase FOTO score to </= 50% limited to demo improvement in function    Time  6    Period  Weeks    Status  On-going      PT LONG TERM GOAL #4   Title  pt to be I with all HEP given as of last visit    Time  6    Period  Weeks    Status  On-going      PT LONG TERM GOAL #5   Title  improve hip strength to >/= 4+/5 in all planes to provide hip stability with walking/ standing activities     Time  6    Period  Weeks    Status  New    Target Date  02/07/18            Plan - 01/14/18 1750    Clinical Impression Statement  Pt reports increased stress and increased tingling today in her back/hip. Pt needed extra time for explanation of exercises and reasons for completing them. Pt admitted her stress at work has prevented her from doing exericses at home.  Pt was given additional exercises for core and hip strengthening.  Pt performing bent knee raise increased her tingling to her feet so did not continue.  Pt has relief with stretches and felt better with movement at end of session.    Rehab Potential   Good    PT Frequency  2x / week    PT Duration  6 weeks    PT Treatment/Interventions  ADLs/Self Care Home Management;Electrical Stimulation;Cryotherapy;Iontophoresis 4mg /ml Dexamethasone;Moist Heat;Ultrasound;Neuromuscular re-education;Functional mobility training;Therapeutic activities;Therapeutic exercise;Manual techniques;Dry needling;Taping;Patient/family education    PT Next Visit Plan  review   potential outflare of the R innominate stretching of addcutors, and hip abductor and core strengthening/ hip stretching, modalities PRN , update hep for standing hip strength if tolerated     PT Home Exercise Plan  lower trunk rotation, posterior pelvic tilt, hamstring stretching, clams, adductor stretch/ sidelying hip abduction, piriformis, SKTC bridging.  eccentric clam, sit to stand    Consulted and Agree with Plan of Care  Patient       Patient will benefit from skilled therapeutic intervention in order to improve the following deficits and impairments:  Abnormal gait, Pain, Difficulty walking, Decreased strength, Postural dysfunction, Improper body mechanics, Decreased balance, Decreased activity tolerance, Increased fascial restricitons, Increased muscle spasms, Hypomobility, Obesity  Visit Diagnosis: Chronic bilateral low back pain, with sciatica presence unspecified  Abnormal posture  Muscle weakness (generalized)     Problem List Patient Active Problem List   Diagnosis Date Noted  . Digital mucous cyst of finger of left hand 10/08/2017  . Acute medial meniscus tear of left knee 07/30/2017  . OSA on CPAP 08/26/2015  . Palpitation 08/21/2012  . Unspecified hypothyroidism 08/21/2012    Voncille Lo, PT Certified Exercise Expert for the Aging Adult  01/14/18 6:05 PM Phone: [phone] Fax: [place] [facility] [address] [place], [place], [address] Phone: [phone]   Fax:  [phone]  Name: Kayla Marshall MRN: [id number] Date of Birth: [birth year]

## 2018-01-17 ENCOUNTER — Ambulatory Visit: Payer: 59 | Admitting: Physical Therapy

## 2018-01-17 ENCOUNTER — Encounter: Payer: Self-pay | Admitting: Physical Therapy

## 2018-01-17 DIAGNOSIS — G8929 Other chronic pain: Secondary | ICD-10-CM

## 2018-01-17 DIAGNOSIS — M545 Low back pain: Secondary | ICD-10-CM

## 2018-01-17 DIAGNOSIS — M6281 Muscle weakness (generalized): Secondary | ICD-10-CM

## 2018-01-17 DIAGNOSIS — R293 Abnormal posture: Secondary | ICD-10-CM | POA: Diagnosis not present

## 2018-01-17 NOTE — Therapy (Signed)
[place], [place], [address] Phone: [phone]   Fax:  [phone]  Physical Therapy Treatment / Re-certification  Patient Details  Name: Kayla Marshall MRN: [id number] Date of Birth: July 02, 1951 Referring Provider: Frankey Shown MD   Encounter Date: 01/17/2018  PT End of Session - 01/17/18 1551    Visit Number  6    Number of Visits  13    Date for PT Re-Evaluation  02/14/18    PT Start Time  1550    PT Stop Time  1631    PT Time Calculation (min)  41 min    Activity Tolerance  Patient tolerated treatment well    Behavior During Therapy  [facility] for tasks assessed/performed       Past Medical History:  Diagnosis Date  . Anxiety   . Arthritis    back  . GERD (gastroesophageal reflux disease)   . Glaucoma   . Hypertension   . Hypothyroidism   . Obesity   . PPH (postpartum hemorrhage)   . Sciatic pain   . Sciatica   . Shortness of breath dyspnea   . Sleep apnea     Past Surgical History:  Procedure Laterality Date  . CHOLECYSTECTOMY  1990  . DILATATION & CURETTAGE/HYSTEROSCOPY WITH MYOSURE N/A 11/12/2015   Procedure: DILATATION & CURETTAGE/HYSTEROSCOPY WITH  MYOSURE;  Surgeon: Thurnell Lose, MD;  Location: [facility];  Service: Gynecology;  Laterality: N/A;    There were no vitals filed for this visit.  Subjective Assessment - 01/17/18 1553    Subjective  " I had more soreness after the last session"    Currently in Pain?  Yes    Pain Score  1     Pain Orientation  Right    Pain Descriptors / Indicators  Aching;Sore    Pain Onset  More than a month ago    Pain Frequency  Intermittent    Aggravating Factors   bending forward    Pain Relieving Factors  heating          [facility] PT Assessment - 01/17/18 1600      Observation/Other Assessments   Focus on Therapeutic Outcomes (FOTO)   60% limited      AROM   Lumbar Flexion  75    Lumbar Extension  20    Lumbar - Right Side Bend  10    Lumbar - Left Side Bend  15      Strength   Right Hip Flexion  4/5    Right Hip Extension  4/5    Right Hip ABduction  4-/5    Left Hip Flexion  4/5    Left Hip Extension  4/5    Left Hip ABduction  4/5                  [facility] Adult PT Treatment/Exercise - 01/17/18 1633      Lumbar Exercises: Stretches   Standing Extension  10 reps x 2 sets    Press Ups  10 reps;5 seconds    Piriformis Stretch  2 reps;30 seconds      Lumbar Exercises: Seated   Other Seated Lumbar Exercises  seated on dyna disc, pelvic tilt 2 x 10 holding 5 sec, marching 2 x 10,      Lumbar Exercises: Supine   Bent Knee Raise  20 reps alternating LE marching keeping core tight      Knee/Hip Exercises: Sidelying   Hip ABduction  2  sets;10 reps      Manual Therapy   Manual therapy comments  MTPR along the R glute medius x 3 bil lumbar paraspinals x3, DTM along the R glute med    Joint Mobilization  innominate grade 3 compression to promote inflarred position             PT Education - 01/17/18 1641    Education provided  Yes    Education Details  reviewed previously provided HEP, and updated HEP today.     Person(s) Educated  Patient    Methods  Explanation;Verbal cues    Comprehension  Verbalized understanding;Verbal cues required       PT Short Term Goals - 01/17/18 1634      PT SHORT TERM GOAL #1   Title  pt to be I with initial HEP    Time  3    Period  Weeks    Status  Achieved      PT SHORT TERM GOAL #2   Title  pt to verbalize and demo proper posture with sitting/ standing and utilize proper mechanics with lifting activities to prevent and reduce low back pain    Time  3    Period  Weeks    Status  On-going      PT SHORT TERM GOAL #3   Title  pt to reduce paraspinal muscle spasm to promote trunk mobility and reduce pain    Period  Weeks    Status  Achieved        PT Long Term Goals - 01/17/18 1634      PT LONG TERM GOAL #1   Title  increase trunk flexion to >/= 70  degrees and extension / bil side bending by >/= 20 degrees with </= 2/10 pain to promote functional mobility required for ADLs     Time  6    Period  Weeks    Status  On-going      PT LONG TERM GOAL #2   Title  pt to be able to sit/ stand and walk for >/= 60 min with </= 2/10 pain for functional endurance required for ADLs and work related activites    Time  6    Period  Weeks    Status  On-going      PT LONG TERM GOAL #3   Title  pt to increase FOTO score to </= 50% limited to demo improvement in function    Time  6    Period  Weeks    Status  On-going      PT LONG TERM GOAL #4   Title  pt to be I with all HEP given as of last visit    Time  6    Period  Weeks    Status  On-going      PT LONG TERM GOAL #5   Title  improve hip strength to >/= 4+/5 in all planes to provide hip stability with walking/ standing activities     Time  6    Period  Weeks    Status  On-going            Plan - 01/17/18 1635    Clinical Impression Statement  pt reports soreness inthe low back but states she can tell she is improving. She is improving with  trunk mobility and bil hip strength and additonally is progressing with goals meeting STG #1 and #3 today. continued manual techniques to reduce muscle tightness in the glute  med/ paraspinals, continued strengthening the hps and core which she reduced N/T down the RLE with standing extension and seated posterior pelvic tilt. She would benefit from continued physical therapy to improve core/ hip strength, promote pelvic alignment, and reduce muscle spasm  and maximize her function by addressing the deficits listed.      PT Next Visit Plan  potential outflare of the R innominate stretching of addcutors, and hip abductor and core strengthening/ hip stretching, modalities PRN , update hep for standing hip strength if tolerated     PT Home Exercise Plan  lower trunk rotation, posterior pelvic tilt, hamstring stretching, clams, adductor stretch/ sidelying  hip abduction, piriformis, SKTC bridging.  eccentric clam, sit to stand    Consulted and Agree with Plan of Care  Patient       Patient will benefit from skilled therapeutic intervention in order to improve the following deficits and impairments:  Abnormal gait, Pain, Difficulty walking, Decreased strength, Postural dysfunction, Improper body mechanics, Decreased balance, Decreased activity tolerance, Increased fascial restricitons, Increased muscle spasms, Hypomobility, Obesity  Visit Diagnosis: Abnormal posture  Chronic bilateral low back pain, with sciatica presence unspecified  Muscle weakness (generalized)     Problem List Patient Active Problem List   Diagnosis Date Noted  . Digital mucous cyst of finger of left hand 10/08/2017  . Acute medial meniscus tear of left knee 07/30/2017  . OSA on CPAP 08/26/2015  . Palpitation 08/21/2012  . Unspecified hypothyroidism 08/21/2012   Starr Lake PT, DPT, LAT, ATC  01/17/18  4:43 PM      [place] [facility] [address] [place], [place], [address] Phone: [phone]   Fax:  [phone]  Name: Kayla Marshall MRN: [id number] Date of Birth: 20-Jun-1951

## 2018-01-24 MED FILL — IBUPROFEN 800 MG TAB: 800 | 20 days supply | Qty: 60 | Fill #1

## 2018-01-28 ENCOUNTER — Encounter: Payer: Self-pay | Admitting: Physical Therapy

## 2018-01-28 ENCOUNTER — Ambulatory Visit: Payer: 59 | Attending: Orthopaedic Surgery | Admitting: Physical Therapy

## 2018-01-28 DIAGNOSIS — I10 Essential (primary) hypertension: Secondary | ICD-10-CM | POA: Diagnosis not present

## 2018-01-28 DIAGNOSIS — R293 Abnormal posture: Secondary | ICD-10-CM

## 2018-01-28 DIAGNOSIS — G8929 Other chronic pain: Secondary | ICD-10-CM

## 2018-01-28 DIAGNOSIS — G4733 Obstructive sleep apnea (adult) (pediatric): Secondary | ICD-10-CM | POA: Diagnosis not present

## 2018-01-28 DIAGNOSIS — R7303 Prediabetes: Secondary | ICD-10-CM | POA: Diagnosis not present

## 2018-01-28 DIAGNOSIS — Z1211 Encounter for screening for malignant neoplasm of colon: Secondary | ICD-10-CM | POA: Diagnosis not present

## 2018-01-28 DIAGNOSIS — M545 Low back pain: Secondary | ICD-10-CM | POA: Insufficient documentation

## 2018-01-28 DIAGNOSIS — M47816 Spondylosis without myelopathy or radiculopathy, lumbar region: Secondary | ICD-10-CM | POA: Diagnosis not present

## 2018-01-28 DIAGNOSIS — F419 Anxiety disorder, unspecified: Secondary | ICD-10-CM | POA: Diagnosis not present

## 2018-01-28 DIAGNOSIS — M6281 Muscle weakness (generalized): Secondary | ICD-10-CM | POA: Diagnosis not present

## 2018-01-28 DIAGNOSIS — R0609 Other forms of dyspnea: Secondary | ICD-10-CM | POA: Diagnosis not present

## 2018-01-28 NOTE — Therapy (Signed)
[place], [place], [address] Phone: [phone]   Fax:  [phone]  Physical Therapy Treatment  Patient Details  Name: Kayla Marshall MRN: [id number] Date of Birth: 1951/01/05 Referring Provider: Frankey Shown MD   Encounter Date: 01/28/2018  PT End of Session - 01/28/18 1738    Visit Number  7    Number of Visits  13    Date for PT Re-Evaluation  02/14/18    PT Start Time  1194    PT Stop Time  1631    PT Time Calculation (min)  44 min    Activity Tolerance  Patient tolerated treatment well    Behavior During Therapy  [facility] for tasks assessed/performed       Past Medical History:  Diagnosis Date  . Anxiety   . Arthritis    back  . GERD (gastroesophageal reflux disease)   . Glaucoma   . Hypertension   . Hypothyroidism   . Obesity   . PPH (postpartum hemorrhage)   . Sciatic pain   . Sciatica   . Shortness of breath dyspnea   . Sleep apnea     Past Surgical History:  Procedure Laterality Date  . CHOLECYSTECTOMY  1990  . DILATATION & CURETTAGE/HYSTEROSCOPY WITH MYOSURE N/A 11/12/2015   Procedure: DILATATION & CURETTAGE/HYSTEROSCOPY WITH  MYOSURE;  Surgeon: Thurnell Lose, MD;  Location: [facility];  Service: Gynecology;  Laterality: N/A;    There were no vitals filed for this visit.  Subjective Assessment - 01/28/18 1549    Subjective  "I am still going to massage, and noticed having increased N/T in both legs which direct pressure on the back of my hamstrings causes it, I think."    Currently in Pain?  Yes    Pain Score  0-No pain    Pain Location  Back    Pain Orientation  Right    Pain Descriptors / Indicators  Aching;Sore    Pain Type  Chronic pain    Aggravating Factors   bending forward    Pain Relieving Factors  heat                      [facility] Adult PT Treatment/Exercise - 01/28/18 1617      Lumbar Exercises: Stretches   Active Hamstring Stretch  Left;Right;2 reps;30  seconds    Single Knee to Chest Stretch  2 reps;30 seconds    Quadruped Mid Back Stretch  3 reps;30 seconds seated    Piriformis Stretch  Left;Right;2 reps;30 seconds      Lumbar Exercises: Aerobic   Nustep  L5 x 5 min LE only      Lumbar Exercises: Supine   Bent Knee Raise  20 reps keeping core tight    Other Supine Lumbar Exercises  core activation pushing bil UE down 2 x 10 keeping core tight throughout exercise      Knee/Hip Exercises: Standing   Stairs  ascending/ descending stairs reciprocally bil using good form and bil HHA PRN. benefits of controlled motion to increase strength in LE.       Knee/Hip Exercises: Sidelying   Hip ABduction  2 sets;10 reps             PT Education - 01/28/18 1737    Education provided  Yes    Education Details  stair training mehcnaics and benefits of proper mechanics. Reviewed all exercises today and benefits of performing exercise.  Person(s) Educated  Patient    Methods  Explanation;Verbal cues;Demonstration    Comprehension  Verbalized understanding;Verbal cues required;Returned demonstration       PT Short Term Goals - 01/17/18 1634      PT SHORT TERM GOAL #1   Title  pt to be I with initial HEP    Time  3    Period  Weeks    Status  Achieved      PT SHORT TERM GOAL #2   Title  pt to verbalize and demo proper posture with sitting/ standing and utilize proper mechanics with lifting activities to prevent and reduce low back pain    Time  3    Period  Weeks    Status  On-going      PT SHORT TERM GOAL #3   Title  pt to reduce paraspinal muscle spasm to promote trunk mobility and reduce pain    Period  Weeks    Status  Achieved        PT Long Term Goals - 01/17/18 1634      PT LONG TERM GOAL #1   Title  increase trunk flexion to >/= 70 degrees and extension / bil side bending by >/= 20 degrees with </= 2/10 pain to promote functional mobility required for ADLs     Time  6    Period  Weeks    Status  On-going       PT LONG TERM GOAL #2   Title  pt to be able to sit/ stand and walk for >/= 60 min with </= 2/10 pain for functional endurance required for ADLs and work related activites    Time  6    Period  Weeks    Status  On-going      PT LONG TERM GOAL #3   Title  pt to increase FOTO score to </= 50% limited to demo improvement in function    Time  6    Period  Weeks    Status  On-going      PT LONG TERM GOAL #4   Title  pt to be I with all HEP given as of last visit    Time  6    Period  Weeks    Status  On-going      PT LONG TERM GOAL #5   Title  improve hip strength to >/= 4+/5 in all planes to provide hip stability with walking/ standing activities     Time  6    Period  Weeks    Status  On-going            Plan - 01/28/18 1738    Clinical Impression Statement  no pain reported today. reviewed all previously provided HEP and specifically reviewed all stretching. reviewed and worked on IT trainer and benefits of proper mechanics which she was worried about causing any meniscal issues. progressed with core strengthening which she required multiple verbal cues for proper form. She declined modalities end of session.     PT Next Visit Plan  potential outflare of the R innominate stretching of adductors, and hip abductor and core strengthening/ hip stretching, modalities PRN , update hep for standing hip strength if tolerated     PT Home Exercise Plan  lower trunk rotation, posterior pelvic tilt, hamstring stretching, clams, adductor stretch/ sidelying hip abduction, piriformis, SKTC bridging.  eccentric clam, sit to stand    Consulted and Agree with Plan of Care  Patient  Patient will benefit from skilled therapeutic intervention in order to improve the following deficits and impairments:  Abnormal gait, Pain, Difficulty walking, Decreased strength, Postural dysfunction, Improper body mechanics, Decreased balance, Decreased activity tolerance, Increased fascial restricitons,  Increased muscle spasms, Hypomobility, Obesity  Visit Diagnosis: Abnormal posture  Muscle weakness (generalized)  Chronic bilateral low back pain, with sciatica presence unspecified     Problem List Patient Active Problem List   Diagnosis Date Noted  . Digital mucous cyst of finger of left hand 10/08/2017  . Acute medial meniscus tear of left knee 07/30/2017  . OSA on CPAP 08/26/2015  . Palpitation 08/21/2012  . Unspecified hypothyroidism 08/21/2012   Starr Lake PT, DPT, LAT, ATC  01/28/18  5:42 PM      [place] [facility] [address] [place], [place], [address] Phone: [phone]   Fax:  [phone]  Name: KALYA TROEGER MRN: [id number] Date of Birth: 03/31/1951

## 2018-01-29 ENCOUNTER — Telehealth: Payer: Self-pay

## 2018-01-29 NOTE — Telephone Encounter (Signed)
SENT REFERRAL TO SCHEDULING FROM DR [facility] PH#   [phone]

## 2018-02-04 ENCOUNTER — Encounter: Payer: Self-pay | Admitting: Physical Therapy

## 2018-02-04 ENCOUNTER — Ambulatory Visit: Payer: 59 | Admitting: Physical Therapy

## 2018-02-04 DIAGNOSIS — R293 Abnormal posture: Secondary | ICD-10-CM

## 2018-02-04 DIAGNOSIS — G8929 Other chronic pain: Secondary | ICD-10-CM | POA: Diagnosis not present

## 2018-02-04 DIAGNOSIS — M6281 Muscle weakness (generalized): Secondary | ICD-10-CM | POA: Diagnosis not present

## 2018-02-04 DIAGNOSIS — M545 Low back pain: Secondary | ICD-10-CM | POA: Diagnosis not present

## 2018-02-04 NOTE — Therapy (Signed)
[place], [place], [address] Phone: [phone]   Fax:  [phone]  Physical Therapy Treatment  Patient Details  Name: Kayla Marshall MRN: [id number] Date of Birth: 1951/04/17 Referring Provider: Frankey Shown MD   Encounter Date: 02/04/2018  PT End of Session - 02/04/18 1548    Visit Number  8    Number of Visits  13    Date for PT Re-Evaluation  02/14/18    PT Start Time  1548    PT Stop Time  1630    PT Time Calculation (min)  42 min    Activity Tolerance  Patient tolerated treatment well    Behavior During Therapy  [facility] for tasks assessed/performed       Past Medical History:  Diagnosis Date  . Anxiety   . Arthritis    back  . GERD (gastroesophageal reflux disease)   . Glaucoma   . Hypertension   . Hypothyroidism   . Obesity   . PPH (postpartum hemorrhage)   . Sciatic pain   . Sciatica   . Shortness of breath dyspnea   . Sleep apnea     Past Surgical History:  Procedure Laterality Date  . CHOLECYSTECTOMY  1990  . DILATATION & CURETTAGE/HYSTEROSCOPY WITH MYOSURE N/A 11/12/2015   Procedure: DILATATION & CURETTAGE/HYSTEROSCOPY WITH  MYOSURE;  Surgeon: Thurnell Lose, MD;  Location: [facility];  Service: Gynecology;  Laterality: N/A;    There were no vitals filed for this visit.  Subjective Assessment - 02/04/18 1548    Subjective  "Im not doing bad, I noticed there is a correlation with sleep and pain"     Currently in Pain?  Yes    Pain Score  1     Pain Location  Back    Pain Orientation  Right    Pain Descriptors / Indicators  Aching;Sore    Pain Type  Chronic pain    Pain Onset  More than a month ago    Pain Frequency  Intermittent                      [facility] Adult PT Treatment/Exercise - 02/04/18 1610      Lumbar Exercises: Aerobic   Nustep  L5 x 5 min LE only      Lumbar Exercises: Standing   Other Standing Lumbar Exercises  pressing ball down onto table with  bil UE 2 x 10 with focus on breathing out x 5 sec       Lumbar Exercises: Seated   Other Seated Lumbar Exercises  seated on green physioball 2 x 10 seated marching      Knee/Hip Exercises: Standing   Hip Abduction  Both;2 sets;Knee straight;10 reps    Hip Extension  2 sets;Stengthening;Knee straight               PT Short Term Goals - 01/17/18 1634      PT SHORT TERM GOAL #1   Title  pt to be I with initial HEP    Time  3    Period  Weeks    Status  Achieved      PT SHORT TERM GOAL #2   Title  pt to verbalize and demo proper posture with sitting/ standing and utilize proper mechanics with lifting activities to prevent and reduce low back pain    Time  3    Period  Weeks    Status  On-going  PT SHORT TERM GOAL #3   Title  pt to reduce paraspinal muscle spasm to promote trunk mobility and reduce pain    Period  Weeks    Status  Achieved        PT Long Term Goals - 01/17/18 1634      PT LONG TERM GOAL #1   Title  increase trunk flexion to >/= 70 degrees and extension / bil side bending by >/= 20 degrees with </= 2/10 pain to promote functional mobility required for ADLs     Time  6    Period  Weeks    Status  On-going      PT LONG TERM GOAL #2   Title  pt to be able to sit/ stand and walk for >/= 60 min with </= 2/10 pain for functional endurance required for ADLs and work related activites    Time  6    Period  Weeks    Status  On-going      PT LONG TERM GOAL #3   Title  pt to increase FOTO score to </= 50% limited to demo improvement in function    Time  6    Period  Weeks    Status  On-going      PT LONG TERM GOAL #4   Title  pt to be I with all HEP given as of last visit    Time  6    Period  Weeks    Status  On-going      PT LONG TERM GOAL #5   Title  improve hip strength to >/= 4+/5 in all planes to provide hip stability with walking/ standing activities     Time  6    Period  Weeks    Status  On-going            Plan - 02/04/18  1615    Clinical Impression Statement  mild soreness noted starting todays session. continued working on hip / core strengthening which she performed well but fatigued quickly with standing hip strengthening. she had difficulty performing exercise while seated on the physioball in regard to balance and keeping core tight.  continue to answer pt questions regarding HEP and prioritizing exercises. end of session she reported no pain.     PT Treatment/Interventions  ADLs/Self Care Home Management;Electrical Stimulation;Cryotherapy;Iontophoresis 4mg /ml Dexamethasone;Moist Heat;Ultrasound;Neuromuscular re-education;Functional mobility training;Therapeutic activities;Therapeutic exercise;Manual techniques;Dry needling;Taping;Patient/family education    PT Next Visit Plan  potential outflare of the R innominate stretching of adductors, and hip abductor and core strengthening/ hip stretching, modalities PRN , update hep for standing hip strength if tolerated     PT Home Exercise Plan  lower trunk rotation, posterior pelvic tilt, hamstring stretching, clams, adductor stretch/ sidelying hip abduction, piriformis, SKTC bridging.  eccentric clam, sit to stand, standing hip abduction/ extension    Consulted and Agree with Plan of Care  Patient       Patient will benefit from skilled therapeutic intervention in order to improve the following deficits and impairments:  Abnormal gait, Pain, Difficulty walking, Decreased strength, Postural dysfunction, Improper body mechanics, Decreased balance, Decreased activity tolerance, Increased fascial restricitons, Increased muscle spasms, Hypomobility, Obesity  Visit Diagnosis: Abnormal posture  Muscle weakness (generalized)  Chronic bilateral low back pain, with sciatica presence unspecified     Problem List Patient Active Problem List   Diagnosis Date Noted  . Digital mucous cyst of finger of left hand 10/08/2017  . Acute medial meniscus tear of left knee  07/30/2017  . OSA on CPAP 08/26/2015  . Palpitation 08/21/2012  . Unspecified hypothyroidism 08/21/2012   Starr Lake PT, DPT, LAT, ATC  02/04/18  4:33 PM      [place] [facility] [address] [place], [place], [address] Phone: [phone]   Fax:  [phone]  Name: Kayla Marshall MRN: [id number] Date of Birth: 1951/11/06

## 2018-02-11 ENCOUNTER — Encounter: Payer: Self-pay | Admitting: Physical Therapy

## 2018-02-11 ENCOUNTER — Ambulatory Visit: Payer: 59 | Admitting: Physical Therapy

## 2018-02-11 DIAGNOSIS — M545 Low back pain: Secondary | ICD-10-CM

## 2018-02-11 DIAGNOSIS — M6281 Muscle weakness (generalized): Secondary | ICD-10-CM | POA: Diagnosis not present

## 2018-02-11 DIAGNOSIS — G8929 Other chronic pain: Secondary | ICD-10-CM | POA: Diagnosis not present

## 2018-02-11 DIAGNOSIS — R293 Abnormal posture: Secondary | ICD-10-CM | POA: Diagnosis not present

## 2018-02-11 DIAGNOSIS — H402231 Chronic angle-closure glaucoma, bilateral, mild stage: Secondary | ICD-10-CM | POA: Diagnosis not present

## 2018-02-11 NOTE — Therapy (Signed)
[place], [place], [address] Phone: [phone]   Fax:  [phone]  Physical Therapy Treatment  Patient Details  Name: Kayla Marshall MRN: [id number] Date of Birth: [birth year] Referring Provider: Frankey Shown MD   Encounter Date: 02/11/2018  PT End of Session - 02/11/18 1510    Visit Number  9    Number of Visits  13    Date for PT Re-Evaluation  02/14/18    PT Start Time  1510    PT Stop Time  1550    PT Time Calculation (min)  40 min       Past Medical History:  Diagnosis Date  . Anxiety   . Arthritis    back  . GERD (gastroesophageal reflux disease)   . Glaucoma   . Hypertension   . Hypothyroidism   . Obesity   . PPH (postpartum hemorrhage)   . Sciatic pain   . Sciatica   . Shortness of breath dyspnea   . Sleep apnea     Past Surgical History:  Procedure Laterality Date  . CHOLECYSTECTOMY  1990  . DILATATION & CURETTAGE/HYSTEROSCOPY WITH MYOSURE N/A 11/12/2015   Procedure: DILATATION & CURETTAGE/HYSTEROSCOPY WITH  MYOSURE;  Surgeon: Thurnell Lose, MD;  Location: [facility];  Service: Gynecology;  Laterality: N/A;    There were no vitals filed for this visit.  Subjective Assessment - 02/11/18 1509    Subjective  "                No data recorded       [facility] Adult PT Treatment/Exercise - 02/11/18 1521      Lumbar Exercises: Aerobic   Nustep  L5 x 5 min UE/LE only      Lumbar Exercises: Seated   Sit to Stand  10 reps with red therband around the knees    Other Seated Lumbar Exercises  seated on green physioball 2 x 10 seated marching      Knee/Hip Exercises: Sidelying   Hip ABduction  2 sets 12 with red theraband      Manual Therapy   Manual Therapy  Myofascial release    Manual therapy comments  MTPR along the R glute medius x 3 bil lumbar paraspinals x3, DTM along the R glute med    Joint Mobilization  innominate grade 3 compression to promote inflarred position     Myofascial Release  DTM over the glute medius                PT Short Term Goals - 01/17/18 1634      PT SHORT TERM GOAL #1   Title  pt to be I with initial HEP    Time  3    Period  Weeks    Status  Achieved      PT SHORT TERM GOAL #2   Title  pt to verbalize and demo proper posture with sitting/ standing and utilize proper mechanics with lifting activities to prevent and reduce low back pain    Time  3    Period  Weeks    Status  On-going      PT SHORT TERM GOAL #3   Title  pt to reduce paraspinal muscle spasm to promote trunk mobility and reduce pain    Period  Weeks    Status  Achieved        PT Long Term Goals - 01/17/18 1634      PT LONG TERM  GOAL #1   Title  increase trunk flexion to >/= 70 degrees and extension / bil side bending by >/= 20 degrees with </= 2/10 pain to promote functional mobility required for ADLs     Time  6    Period  Weeks    Status  On-going      PT LONG TERM GOAL #2   Title  pt to be able to sit/ stand and walk for >/= 60 min with </= 2/10 pain for functional endurance required for ADLs and work related activites    Time  6    Period  Weeks    Status  On-going      PT LONG TERM GOAL #3   Title  pt to increase FOTO score to </= 50% limited to demo improvement in function    Time  6    Period  Weeks    Status  On-going      PT LONG TERM GOAL #4   Title  pt to be I with all HEP given as of last visit    Time  6    Period  Weeks    Status  On-going      PT LONG TERM GOAL #5   Title  improve hip strength to >/= 4+/5 in all planes to provide hip stability with walking/ standing activities     Time  6    Period  Weeks    Status  On-going            Plan - 02/11/18 1540    Clinical Impression Statement  reports of mild sorneess starting to days session only when she gets out ofher car. continued manual techniques to calm down glute med tightness/ soreness. focused on core/ hip strengthening which she performed well  but fatigues quicly with core activity. end of session she reported no increase in pain and declined modalities.     PT Next Visit Plan  potential outflare of the R innominate stretching of adductors, and hip abductor and core strengthening/ hip stretching, modalities PRN , update hep for standing hip strength if tolerated     PT Home Exercise Plan  lower trunk rotation, posterior pelvic tilt, hamstring stretching, clams, adductor stretch/ sidelying hip abduction, piriformis, SKTC bridging.  eccentric clam, sit to stand, standing hip abduction/ extension       Patient will benefit from skilled therapeutic intervention in order to improve the following deficits and impairments:  Abnormal gait, Pain, Difficulty walking, Decreased strength, Postural dysfunction, Improper body mechanics, Decreased balance, Decreased activity tolerance, Increased fascial restricitons, Increased muscle spasms, Hypomobility, Obesity  Visit Diagnosis: Abnormal posture  Muscle weakness (generalized)  Chronic bilateral low back pain, with sciatica presence unspecified     Problem List Patient Active Problem List   Diagnosis Date Noted  . Digital mucous cyst of finger of left hand 10/08/2017  . Acute medial meniscus tear of left knee 07/30/2017  . OSA on CPAP 08/26/2015  . Palpitation 08/21/2012  . Unspecified hypothyroidism 08/21/2012   Starr Lake PT, DPT, LAT, ATC  02/11/18  3:49 PM      [place] [facility] [address] [place], [place], [address] Phone: [phone]   Fax:  [phone]  Name: Kayla Marshall MRN: [id number] Date of Birth: 01-19-1951

## 2018-02-14 DIAGNOSIS — R0609 Other forms of dyspnea: Secondary | ICD-10-CM | POA: Diagnosis not present

## 2018-02-14 DIAGNOSIS — I10 Essential (primary) hypertension: Secondary | ICD-10-CM | POA: Diagnosis not present

## 2018-02-14 DIAGNOSIS — I358 Other nonrheumatic aortic valve disorders: Secondary | ICD-10-CM | POA: Diagnosis not present

## 2018-02-18 ENCOUNTER — Ambulatory Visit: Payer: 59 | Admitting: Physical Therapy

## 2018-02-21 MED FILL — LISINOPRIL 20 MG TABLET: 20 | 90 days supply | Qty: 90 | Fill #0

## 2018-02-21 MED FILL — LATANOPROST 0.005% EYE DRP: 0.005 | 25 days supply | Qty: 3 | Fill #3

## 2018-02-22 MED FILL — ALPRAZolam 0.25 MG TABS: 0.25 | 30 days supply | Qty: 30 | Fill #0

## 2018-02-25 ENCOUNTER — Encounter: Payer: Self-pay | Admitting: Physical Therapy

## 2018-02-25 ENCOUNTER — Ambulatory Visit: Payer: 59 | Attending: Orthopaedic Surgery | Admitting: Physical Therapy

## 2018-02-25 DIAGNOSIS — G8929 Other chronic pain: Secondary | ICD-10-CM | POA: Diagnosis not present

## 2018-02-25 DIAGNOSIS — M6281 Muscle weakness (generalized): Secondary | ICD-10-CM | POA: Diagnosis not present

## 2018-02-25 DIAGNOSIS — M545 Low back pain: Secondary | ICD-10-CM | POA: Diagnosis not present

## 2018-02-25 DIAGNOSIS — R293 Abnormal posture: Secondary | ICD-10-CM

## 2018-02-25 NOTE — Therapy (Signed)
[place], [place], [address] Phone: [phone]   Fax:  [phone]  Physical Therapy Treatment / Re-certification  Patient Details  Name: Kayla Marshall MRN: [id number] Date of Birth: 1950-12-18 Referring Provider: Frankey Shown MD   Encounter Date: 02/25/2018  PT End of Session - 02/25/18 1604    Visit Number  10    Number of Visits  13    Date for PT Re-Evaluation  03/25/18    PT Start Time  1548    PT Stop Time  1632    PT Time Calculation (min)  44 min    Activity Tolerance  Patient tolerated treatment well    Behavior During Therapy  [facility] for tasks assessed/performed       Past Medical History:  Diagnosis Date  . Anxiety   . Arthritis    back  . GERD (gastroesophageal reflux disease)   . Glaucoma   . Hypertension   . Hypothyroidism   . Obesity   . PPH (postpartum hemorrhage)   . Sciatic pain   . Sciatica   . Shortness of breath dyspnea   . Sleep apnea     Past Surgical History:  Procedure Laterality Date  . CHOLECYSTECTOMY  1990  . DILATATION & CURETTAGE/HYSTEROSCOPY WITH MYOSURE N/A 11/12/2015   Procedure: DILATATION & CURETTAGE/HYSTEROSCOPY WITH  MYOSURE;  Surgeon: Thurnell Lose, MD;  Location: [facility];  Service: Gynecology;  Laterality: N/A;    There were no vitals filed for this visit.  Subjective Assessment - 02/25/18 1551    Subjective  "I am still having pain inthe piriformis, and in the mornings my feet still feel heavy and still have tingling in the RLE. i've been walking more often"     Currently in Pain?  Yes    Pain Score  2     Pain Orientation  Right    Pain Descriptors / Indicators  Aching;Sore    Aggravating Factors   unsure    Pain Relieving Factors  heat         [facility] PT Assessment - 02/25/18 1610      Observation/Other Assessments   Focus on Therapeutic Outcomes (FOTO)   47 % limited      AROM   Lumbar Flexion  72    Lumbar Extension  18    Lumbar -  Right Side Bend  13    Lumbar - Left Side Bend  13      Strength   Right Hip Flexion  5/5    Right Hip ABduction  4-/5    Left Hip Flexion  5/5    Left Hip Extension  4-/5                   [facility] Adult PT Treatment/Exercise - 02/25/18 1558      Lumbar Exercises: Stretches   Quadruped Mid Back Stretch  2 reps;30 seconds    Piriformis Stretch  Left;Right;2 reps;30 seconds      Lumbar Exercises: Aerobic   Nustep  L5 x 5 min UE/LE only      Lumbar Exercises: Seated   Other Seated Lumbar Exercises  seated hip er 1 x 15 with red theraband      Knee/Hip Exercises: Sidelying   Other Sidelying Knee/Hip Exercises  reverse clamshells 2 x 10 in L sidelying      Manual Therapy   Manual Therapy  Soft tissue mobilization    Soft tissue mobilization  IASTM over  R glute med/ piriformis     Myofascial Release  DTM over the piriformis             PT Education - 02/25/18 1644    Education provided  Yes    Education Details  reviewed previously provided HEP, updated for reverse clamshell    Person(s) Educated  Patient    Methods  Explanation;Verbal cues;Handout    Comprehension  Verbalized understanding;Verbal cues required       PT Short Term Goals - 02/25/18 1644      PT SHORT TERM GOAL #1   Title  pt to be I with initial HEP    Time  3    Period  Weeks    Status  Achieved      PT SHORT TERM GOAL #2   Title  pt to verbalize and demo proper posture with sitting/ standing and utilize proper mechanics with lifting activities to prevent and reduce low back pain    Time  3    Period  Weeks    Status  Achieved      PT SHORT TERM GOAL #3   Title  pt to reduce paraspinal muscle spasm to promote trunk mobility and reduce pain    Period  Weeks    Status  Achieved        PT Long Term Goals - 02/25/18 1645      PT LONG TERM GOAL #1   Title  increase trunk flexion to >/= 70 degrees and extension / bil side bending by >/= 20 degrees with </= 2/10 pain to promote  functional mobility required for ADLs     Period  Weeks    Status  On-going    Target Date  03/25/18      PT LONG TERM GOAL #2   Title  pt to be able to sit/ stand and walk for >/= 60 min with </= 2/10 pain for functional endurance required for ADLs and work related activites    Time  6    Period  Weeks    Status  On-going    Target Date  03/25/18      PT LONG TERM GOAL #3   Title  pt to increase FOTO score to </= 50% limited to demo improvement in function    Baseline  47% limited    Period  Weeks    Status  Achieved      PT LONG TERM GOAL #4   Title  pt to be I with all HEP given as of last visit    Time  6    Period  Weeks    Status  On-going    Target Date  03/25/18      PT LONG TERM GOAL #5   Title  improve hip strength to >/= 4+/5 in all planes to provide hip stability with walking/ standing activities     Time  6    Period  Weeks    Status  On-going            Plan - 02/25/18 1638    Clinical Impression Statement  pt reports continues to report intermittent pain. She is progressing with trunk mobility and hip strength, but conitnues to report piriformis pain that is improved with stretching. continued working on hip strengthening and piriformis activation to calm down. she is progressing with her LTG's and would benefit from continued physical therapy to increase hip strength, reduce pain and maximize her function by addressing the  deficits listed and work toward independent exercise.     PT Frequency  1x / week    PT Duration  4 weeks    PT Next Visit Plan  potential outflare of the R innominate stretching of adductors, and hip abductor and core strengthening/ hip stretching, modalities PRN , review seated physioball exercise, strengthen hip ER    PT Home Exercise Plan  lower trunk rotation, posterior pelvic tilt, hamstring stretching, clams, adductor stretch/ sidelying hip abduction, piriformis, SKTC bridging.  eccentric clam, sit to stand, standing hip abduction/  extension, sidelying reverse clam shell.     Consulted and Agree with Plan of Care  Patient       Patient will benefit from skilled therapeutic intervention in order to improve the following deficits and impairments:  Abnormal gait, Pain, Difficulty walking, Decreased strength, Postural dysfunction, Improper body mechanics, Decreased balance, Decreased activity tolerance, Increased fascial restricitons, Increased muscle spasms, Hypomobility, Obesity  Visit Diagnosis: Abnormal posture  Muscle weakness (generalized)  Chronic bilateral low back pain, with sciatica presence unspecified     Problem List Patient Active Problem List   Diagnosis Date Noted  . Digital mucous cyst of finger of left hand 10/08/2017  . Acute medial meniscus tear of left knee 07/30/2017  . OSA on CPAP 08/26/2015  . Palpitation 08/21/2012  . Unspecified hypothyroidism 08/21/2012   Starr Lake PT, DPT, LAT, ATC  02/25/18  4:48 PM      [facility] [address] [place], [place], [address] Phone: [phone]   Fax:  [phone]  Name: Kayla Marshall MRN: [id number] Date of Birth: [birth year]

## 2018-02-27 MED FILL — LATANOPROST 0.005% EYE DRP: 0.005 | 50 days supply | Qty: 5 | Fill #4

## 2018-02-27 MED FILL — IBUPROFEN 800 MG TAB: 800 | 20 days supply | Qty: 60 | Fill #2

## 2018-02-28 DIAGNOSIS — G4733 Obstructive sleep apnea (adult) (pediatric): Secondary | ICD-10-CM | POA: Diagnosis not present

## 2018-03-04 ENCOUNTER — Ambulatory Visit: Payer: 59 | Admitting: Physical Therapy

## 2018-03-11 ENCOUNTER — Encounter: Payer: Self-pay | Admitting: Physical Therapy

## 2018-03-11 ENCOUNTER — Ambulatory Visit: Payer: 59 | Admitting: Physical Therapy

## 2018-03-11 DIAGNOSIS — M6281 Muscle weakness (generalized): Secondary | ICD-10-CM | POA: Diagnosis not present

## 2018-03-11 DIAGNOSIS — R293 Abnormal posture: Secondary | ICD-10-CM | POA: Diagnosis not present

## 2018-03-11 DIAGNOSIS — G8929 Other chronic pain: Secondary | ICD-10-CM

## 2018-03-11 DIAGNOSIS — M545 Low back pain: Secondary | ICD-10-CM

## 2018-03-11 NOTE — Therapy (Signed)
[place], [place], [address] Phone: [phone]   Fax:  [phone]  Physical Therapy Treatment  Patient Details  Name: Kayla Marshall MRN: [id number] Date of Birth: 29-Jul-1951 Referring Provider: Frankey Shown MD   Encounter Date: 03/11/2018  PT End of Session - 03/11/18 1631    Visit Number  11    Number of Visits  13    PT Start Time  6283 pt arrived 8 minutes late today    PT Stop Time  1622    PT Time Calculation (min)  28 min    Activity Tolerance  Patient tolerated treatment well    Behavior During Therapy  [facility] for tasks assessed/performed       Past Medical History:  Diagnosis Date  . Anxiety   . Arthritis    back  . GERD (gastroesophageal reflux disease)   . Glaucoma   . Hypertension   . Hypothyroidism   . Obesity   . PPH (postpartum hemorrhage)   . Sciatic pain   . Sciatica   . Shortness of breath dyspnea   . Sleep apnea     Past Surgical History:  Procedure Laterality Date  . CHOLECYSTECTOMY  1990  . DILATATION & CURETTAGE/HYSTEROSCOPY WITH MYOSURE N/A 11/12/2015   Procedure: DILATATION & CURETTAGE/HYSTEROSCOPY WITH  MYOSURE;  Surgeon: Thurnell Lose, MD;  Location: [facility];  Service: Gynecology;  Laterality: N/A;    There were no vitals filed for this visit.  Subjective Assessment - 03/11/18 1554    Subjective  "I am not bad today, I've been taking ibuprofen. my feet are more swollen today"     Currently in Pain?  No/denies    Pain Score  -- took ibuprofen this morning.    Pain Location  Back    Pain Orientation  Right    Aggravating Factors   unusre    Pain Relieving Factors  heat                       [facility] Adult PT Treatment/Exercise - 03/11/18 1558      Lumbar Exercises: Aerobic   Nustep  L6 x 7 min UE/LE only      Lumbar Exercises: Seated   Other Seated Lumbar Exercises  seated on green physioball 2 x 10 seated marching with red theraband around  knees      Knee/Hip Exercises: Machines for Strengthening   Cybex Knee Extension  2 x 10 20# bil LE    Cybex Knee Flexion  2 x 10 25# bil      Knee/Hip Exercises: Standing   Hip Abduction  2 sets;Both;Stengthening;15 reps with red theraband around the knees    Hip Extension  2 sets;Stengthening 12 reps, with red theraband               PT Short Term Goals - 02/25/18 1644      PT SHORT TERM GOAL #1   Title  pt to be I with initial HEP    Time  3    Period  Weeks    Status  Achieved      PT SHORT TERM GOAL #2   Title  pt to verbalize and demo proper posture with sitting/ standing and utilize proper mechanics with lifting activities to prevent and reduce low back pain    Time  3    Period  Weeks    Status  Achieved      PT  SHORT TERM GOAL #3   Title  pt to reduce paraspinal muscle spasm to promote trunk mobility and reduce pain    Period  Weeks    Status  Achieved        PT Long Term Goals - 02/25/18 1645      PT LONG TERM GOAL #1   Title  increase trunk flexion to >/= 70 degrees and extension / bil side bending by >/= 20 degrees with </= 2/10 pain to promote functional mobility required for ADLs     Period  Weeks    Status  On-going    Target Date  03/25/18      PT LONG TERM GOAL #2   Title  pt to be able to sit/ stand and walk for >/= 60 min with </= 2/10 pain for functional endurance required for ADLs and work related activites    Time  6    Period  Weeks    Status  On-going    Target Date  03/25/18      PT LONG TERM GOAL #3   Title  pt to increase FOTO score to </= 50% limited to demo improvement in function    Baseline  47% limited    Period  Weeks    Status  Achieved      PT LONG TERM GOAL #4   Title  pt to be I with all HEP given as of last visit    Time  6    Period  Weeks    Status  On-going    Target Date  03/25/18      PT LONG TERM GOAL #5   Title  improve hip strength to >/= 4+/5 in all planes to provide hip stability with walking/  standing activities     Time  6    Period  Weeks    Status  On-going            Plan - 03/11/18 1632    Clinical Impression Statement  pt reports no pain today stating she has to take ibuprofen intermittently. continued working on hip/knee strengthening working on [employer] with increased reps. continued working on core strengthening which she performed well. end of session she reported no pain and delcined modalities.     PT Next Visit Plan  posture education and lifting mechanics. hip abductor and core strengthening/ hip stretching, modalities PRN , review seated physioball exercise, strengthen hip ER,     PT Home Exercise Plan  lower trunk rotation, posterior pelvic tilt, hamstring stretching, clams, adductor stretch/ sidelying hip abduction, piriformis, SKTC bridging.  eccentric clam, sit to stand, standing hip abduction/ extension, sidelying reverse clam shell.     Consulted and Agree with Plan of Care  Patient       Patient will benefit from skilled therapeutic intervention in order to improve the following deficits and impairments:  Abnormal gait, Pain, Difficulty walking, Decreased strength, Postural dysfunction, Improper body mechanics, Decreased balance, Decreased activity tolerance, Increased fascial restricitons, Increased muscle spasms, Hypomobility, Obesity  Visit Diagnosis: Abnormal posture  Muscle weakness (generalized)  Chronic bilateral low back pain, with sciatica presence unspecified     Problem List Patient Active Problem List   Diagnosis Date Noted  . Digital mucous cyst of finger of left hand 10/08/2017  . Acute medial meniscus tear of left knee 07/30/2017  . OSA on CPAP 08/26/2015  . Palpitation 08/21/2012  . Unspecified hypothyroidism 08/21/2012   Joanne Salah PT, DPT, LAT, ATC  03/11/18  4:36 PM      [place], [place], [address] Phone: [phone]    Fax:  [phone]  Name: Kayla Marshall MRN: [id number] Date of Birth: 07-13-1951

## 2018-03-12 DIAGNOSIS — R0609 Other forms of dyspnea: Secondary | ICD-10-CM | POA: Diagnosis not present

## 2018-03-12 DIAGNOSIS — I358 Other nonrheumatic aortic valve disorders: Secondary | ICD-10-CM | POA: Diagnosis not present

## 2018-03-12 DIAGNOSIS — R0989 Other specified symptoms and signs involving the circulatory and respiratory systems: Secondary | ICD-10-CM | POA: Diagnosis not present

## 2018-03-13 ENCOUNTER — Ambulatory Visit: Payer: 59 | Admitting: Podiatry

## 2018-03-13 ENCOUNTER — Encounter: Payer: Self-pay | Admitting: Podiatry

## 2018-03-13 DIAGNOSIS — M79609 Pain in unspecified limb: Secondary | ICD-10-CM | POA: Diagnosis not present

## 2018-03-13 DIAGNOSIS — L600 Ingrowing nail: Secondary | ICD-10-CM

## 2018-03-13 DIAGNOSIS — B351 Tinea unguium: Secondary | ICD-10-CM | POA: Diagnosis not present

## 2018-03-13 NOTE — Patient Instructions (Signed)

## 2018-03-14 NOTE — Progress Notes (Signed)
Subjective:   Patient ID: Kayla Marshall, female   DOB: 67 y.o.   MRN: [id number]   HPI Patient presents with damage hallux nails both feet medial lateral border right medial border left that are painful and make wearing shoe gear difficult.  She has tried to trim them herself without relief and ultimately would like permanent correction    ROS      Objective:  Physical Exam  Neurovascular status is found to be intact with thickened nailbeds hallux bilaterally with incurvation also noted with 2 separate problems been present     Assessment:  Mycotic nail infection hallux bilateral with ingrown component     Plan:  Reviewed ingrown component and at this point I have recommended temporary debridement as she has to return to work with permanent procedure to be performed I educated her on permanent procedure today and debridement accomplished of the hallux nail bilateral no iatrogenic bleeding noted

## 2018-03-15 ENCOUNTER — Ambulatory Visit: Payer: 59 | Admitting: Podiatry

## 2018-03-18 ENCOUNTER — Ambulatory Visit: Payer: 59 | Admitting: Physical Therapy

## 2018-03-22 ENCOUNTER — Ambulatory Visit: Payer: 59 | Admitting: Podiatry

## 2018-04-01 ENCOUNTER — Encounter: Payer: Self-pay | Admitting: Physical Therapy

## 2018-04-01 ENCOUNTER — Ambulatory Visit: Payer: 59 | Attending: Orthopaedic Surgery | Admitting: Physical Therapy

## 2018-04-01 DIAGNOSIS — M6281 Muscle weakness (generalized): Secondary | ICD-10-CM

## 2018-04-01 DIAGNOSIS — M545 Low back pain: Secondary | ICD-10-CM | POA: Insufficient documentation

## 2018-04-01 DIAGNOSIS — G8929 Other chronic pain: Secondary | ICD-10-CM

## 2018-04-01 DIAGNOSIS — R293 Abnormal posture: Secondary | ICD-10-CM | POA: Diagnosis not present

## 2018-04-01 NOTE — Patient Instructions (Signed)

## 2018-04-01 NOTE — Therapy (Signed)
[place], [place], [address] Phone: [phone]   Fax:  [phone]  Physical Therapy Treatment / Re-certification  Patient Details  Name: Kayla Marshall MRN: [id number] Date of Birth: 1950-12-14 Referring Provider: Frankey Shown MD   Encounter Date: 04/01/2018  PT End of Session - 04/01/18 1428    Visit Number  12    Number of Visits  14    Date for PT Re-Evaluation  04/22/18    PT Start Time  3532    PT Stop Time  1503    PT Time Calculation (min)  42 min    Activity Tolerance  Patient tolerated treatment well    Behavior During Therapy  [facility] for tasks assessed/performed       Past Medical History:  Diagnosis Date  . Anxiety   . Arthritis    back  . GERD (gastroesophageal reflux disease)   . Glaucoma   . Hypertension   . Hypothyroidism   . Obesity   . PPH (postpartum hemorrhage)   . Sciatic pain   . Sciatica   . Shortness of breath dyspnea   . Sleep apnea     Past Surgical History:  Procedure Laterality Date  . CHOLECYSTECTOMY  1990  . DILATATION & CURETTAGE/HYSTEROSCOPY WITH MYOSURE N/A 11/12/2015   Procedure: DILATATION & CURETTAGE/HYSTEROSCOPY WITH  MYOSURE;  Surgeon: Thurnell Lose, MD;  Location: [facility];  Service: Gynecology;  Laterality: N/A;    There were no vitals filed for this visit.  Subjective Assessment - 04/01/18 1422    Subjective  "the echocardiogram went well, I did alot of work on 2 different halls and I have been more stressed, and I have been trying not to take as much medication which I am feeling more stiff"     Currently in Pain?  Yes    Pain Location  Back    Pain Orientation  Right    Pain Descriptors / Indicators  Tightness;Sore         [facility] PT Assessment - 04/01/18 1442      Observation/Other Assessments   Focus on Therapeutic Outcomes (FOTO)   53% limited      AROM   Lumbar Flexion  79    Lumbar Extension  30    Lumbar - Right Side Bend  20    Lumbar - Left Side Bend  20      Strength   Right Hip Flexion  5/5    Right Hip Extension  5/5    Right Hip ABduction  4/5    Left Hip Flexion  5/5    Left Hip Extension  4/5                   [facility] Adult PT Treatment/Exercise - 04/01/18 1515      Self-Care   Self-Care  Posture    Posture  handout regarding proper posture and lifting mechanics to promote proper spinal curve       Lumbar Exercises: Aerobic   Nustep  L6 x 7 min UE/LE only      Lumbar Exercises: Supine   Ab Set  10 reps;2 seconds      Manual Therapy   Manual therapy comments  MTPR along the R glute medius x 3 bil lumbar paraspinals x3, DTM along the R glute med    Soft tissue mobilization  IASTM over R glute med/ piriformis  PT Short Term Goals - 04/01/18 1447      PT SHORT TERM GOAL #1   Title  pt to be I with initial HEP    Time  3    Period  Weeks    Status  Achieved      PT SHORT TERM GOAL #2   Title  pt to verbalize and demo proper posture with sitting/ standing and utilize proper mechanics with lifting activities to prevent and reduce low back pain    Time  3    Period  Weeks    Status  Achieved      PT SHORT TERM GOAL #3   Title  pt to reduce paraspinal muscle spasm to promote trunk mobility and reduce pain    Period  Weeks    Status  Achieved        PT Long Term Goals - 04/01/18 1517      PT LONG TERM GOAL #1   Title  increase trunk flexion to >/= 70 degrees and extension / bil side bending by >/= 20 degrees with </= 2/10 pain to promote functional mobility required for ADLs     Time  6    Period  Weeks    Status  Achieved      PT LONG TERM GOAL #2   Title  pt to be able to sit/ stand and walk for >/= 60 min with </= 2/10 pain for functional endurance required for ADLs and work related activites    Time  3    Period  Weeks    Status  On-going    Target Date  04/22/18      PT LONG TERM GOAL #3   Title  pt to increase FOTO score to </= 50% limited  to demo improvement in function    Time  6    Period  Weeks    Status  On-going    Target Date  04/22/18      PT LONG TERM GOAL #4   Title  pt to be I with all HEP given as of last visit    Time  6    Period  Weeks    Status  On-going    Target Date  04/22/18      PT LONG TERM GOAL #5   Title  improve hip strength to >/= 4+/5 in all planes to provide hip stability with walking/ standing activities     Time  3    Period  Weeks    Status  On-going    Target Date  04/22/18            Plan - 04/01/18 1516    Clinical Impression Statement  pt reports increased soreness inthe back / hip today which may be attributed to doing more lifting/ moving at home and more stress at work. she continues to make progress with trunk mobility and strength and is progressing with goals appropriatlye. plan to see pt for a few more visits to work on posture, core/ hip strength, work on remaining goals and independent exercise and discharge from PT.     Rehab Potential  Good    PT Frequency  1x / week    PT Duration  3 weeks    PT Treatment/Interventions  ADLs/Self Care Home Management;Electrical Stimulation;Cryotherapy;Iontophoresis 4mg /ml Dexamethasone;Moist Heat;Ultrasound;Neuromuscular re-education;Functional mobility training;Therapeutic activities;Therapeutic exercise;Manual techniques;Dry needling;Taping;Patient/family education    PT Next Visit Plan  posture education and lifting mechanics. hip abductor and core strengthening/ hip  stretching, modalities PRN , review seated physioball exercise, strengthen hip ER,     PT Home Exercise Plan  lower trunk rotation, posterior pelvic tilt, hamstring stretching, clams, adductor stretch/ sidelying hip abduction, piriformis, SKTC bridging.  eccentric clam, sit to stand, standing hip abduction/ extension, sidelying reverse clam shell.     Consulted and Agree with Plan of Care  Patient       Patient will benefit from skilled therapeutic intervention in  order to improve the following deficits and impairments:  Abnormal gait, Pain, Difficulty walking, Decreased strength, Postural dysfunction, Improper body mechanics, Decreased balance, Decreased activity tolerance, Increased fascial restricitons, Increased muscle spasms, Hypomobility, Obesity  Visit Diagnosis: Abnormal posture  Muscle weakness (generalized)  Chronic bilateral low back pain, with sciatica presence unspecified     Problem List Patient Active Problem List   Diagnosis Date Noted  . Digital mucous cyst of finger of left hand 10/08/2017  . Acute medial meniscus tear of left knee 07/30/2017  . OSA on CPAP 08/26/2015  . Palpitation 08/21/2012  . Unspecified hypothyroidism 08/21/2012    Kayla Marshall PT, DPT, LAT, ATC  04/01/18  3:22 PM      [facility] [address] [place], [place], [address] Phone: [phone]   Fax:  [phone]  Name: Kayla Marshall MRN: [id number] Date of Birth: [birth year]

## 2018-04-09 DIAGNOSIS — H00022 Hordeolum internum right lower eyelid: Secondary | ICD-10-CM | POA: Diagnosis not present

## 2018-04-09 DIAGNOSIS — H01003 Unspecified blepharitis right eye, unspecified eyelid: Secondary | ICD-10-CM | POA: Diagnosis not present

## 2018-04-09 MED FILL — LATANOPROST 0.005% EYE DRP: 0.005 | 90 days supply | Qty: 8 | Fill #0

## 2018-04-09 MED FILL — NEO/POLY/DEXAMET EYE OINT: 3.5-10000-0 | 30 days supply | Qty: 4 | Fill #0

## 2018-04-12 MED FILL — IBUPROFEN 800 MG TAB: 800 | 20 days supply | Qty: 60 | Fill #3

## 2018-04-22 ENCOUNTER — Ambulatory Visit: Payer: 59 | Attending: Orthopaedic Surgery | Admitting: Physical Therapy

## 2018-04-22 ENCOUNTER — Encounter: Payer: Self-pay | Admitting: Physical Therapy

## 2018-04-22 DIAGNOSIS — M545 Low back pain: Secondary | ICD-10-CM | POA: Insufficient documentation

## 2018-04-22 DIAGNOSIS — R293 Abnormal posture: Secondary | ICD-10-CM | POA: Insufficient documentation

## 2018-04-22 DIAGNOSIS — G8929 Other chronic pain: Secondary | ICD-10-CM | POA: Diagnosis not present

## 2018-04-22 DIAGNOSIS — M6281 Muscle weakness (generalized): Secondary | ICD-10-CM | POA: Insufficient documentation

## 2018-04-22 NOTE — Therapy (Signed)
[place], [place], [address] Phone: [phone]   Fax:  [phone]  Physical Therapy Treatment  Patient Details  Name: GLENICE CICCONE MRN: [id number] Date of Birth: 07/13/1951 Referring Provider: Frankey Shown MD   Encounter Date: 04/22/2018  PT End of Session - 04/22/18 1555    Visit Number  13    Number of Visits  14    Date for PT Re-Evaluation  04/22/18    PT Start Time  1556 pt arrived 10 min late    PT Stop Time  1630    PT Time Calculation (min)  34 min    Activity Tolerance  Patient tolerated treatment well    Behavior During Therapy  [facility] for tasks assessed/performed       Past Medical History:  Diagnosis Date  . Anxiety   . Arthritis    back  . GERD (gastroesophageal reflux disease)   . Glaucoma   . Hypertension   . Hypothyroidism   . Obesity   . PPH (postpartum hemorrhage)   . Sciatic pain   . Sciatica   . Shortness of breath dyspnea   . Sleep apnea     Past Surgical History:  Procedure Laterality Date  . CHOLECYSTECTOMY  1990  . DILATATION & CURETTAGE/HYSTEROSCOPY WITH MYOSURE N/A 11/12/2015   Procedure: DILATATION & CURETTAGE/HYSTEROSCOPY WITH  MYOSURE;  Surgeon: Thurnell Lose, MD;  Location: [facility];  Service: Gynecology;  Laterality: N/A;    There were no vitals filed for this visit.  Subjective Assessment - 04/22/18 1558    Subjective  "I am doing about the same since the last session, I noticed more pain inthe R low back and it is going in to the calf. I went to the chiropractor the since the last vist which helped alittle"    Currently in Pain?  Yes    Pain Score  8     Pain Location  Back    Pain Orientation  Right    Pain Type  Chronic pain    Pain Onset  More than a month ago    Pain Frequency  Intermittent    Aggravating Factors   doing too much     Pain Relieving Factors  heat         [facility] PT Assessment - 04/22/18 0001      AROM   Lumbar Flexion  79     Lumbar Extension  30    Lumbar - Right Side Bend  20    Lumbar - Left Side Bend  20                   [facility] Adult PT Treatment/Exercise - 04/22/18 1601      Self-Care   Other Self-Care Comments   provided heel lift and discussed benefits to promote pelvic equality      Lumbar Exercises: Stretches   Piriformis Stretch  Right;Left;2 reps;30 seconds    Gastroc Stretch  2 reps;30 seconds      Lumbar Exercises: Aerobic   Nustep  L5 x 6 min UE/LE only      Knee/Hip Exercises: Standing   Gait Training  walking 2 x 30 ft with heel lift focusing on good form pt noted no pai inthe R low back / hip             PT Education - 04/22/18 1724    Education provided  Yes    Education Details  reviewed  previously provided HEP, discussed findings of LLD of RLE compared bil.    Person(s) Educated  Patient    Methods  Explanation;Verbal cues    Comprehension  Verbalized understanding;Verbal cues required       PT Short Term Goals - 04/01/18 1447      PT SHORT TERM GOAL #1   Title  pt to be I with initial HEP    Time  3    Period  Weeks    Status  Achieved      PT SHORT TERM GOAL #2   Title  pt to verbalize and demo proper posture with sitting/ standing and utilize proper mechanics with lifting activities to prevent and reduce low back pain    Time  3    Period  Weeks    Status  Achieved      PT SHORT TERM GOAL #3   Title  pt to reduce paraspinal muscle spasm to promote trunk mobility and reduce pain    Period  Weeks    Status  Achieved        PT Long Term Goals - 04/01/18 1517      PT LONG TERM GOAL #1   Title  increase trunk flexion to >/= 70 degrees and extension / bil side bending by >/= 20 degrees with </= 2/10 pain to promote functional mobility required for ADLs     Time  6    Period  Weeks    Status  Achieved      PT LONG TERM GOAL #2   Title  pt to be able to sit/ stand and walk for >/= 60 min with </= 2/10 pain for functional endurance required for  ADLs and work related activites    Time  3    Period  Weeks    Status  On-going    Target Date  04/22/18      PT LONG TERM GOAL #3   Title  pt to increase FOTO score to </= 50% limited to demo improvement in function    Time  6    Period  Weeks    Status  On-going    Target Date  04/22/18      PT LONG TERM GOAL #4   Title  pt to be I with all HEP given as of last visit    Time  6    Period  Weeks    Status  On-going    Target Date  04/22/18      PT LONG TERM GOAL #5   Title  improve hip strength to >/= 4+/5 in all planes to provide hip stability with walking/ standing activities     Time  3    Period  Weeks    Status  On-going    Target Date  04/22/18            Plan - 04/22/18 1727    Clinical Impression Statement  pt states she has increase soreness in the R low back / hip with minimal changes since the last session, She has remained the same since the last session. Focused on assessing leg length today and provided heel lift in the R shoe which she reported significant relief of pain  in the R hip with standing/ walking. Plan to review/ finialize HEP next visit and discharge.     PT Treatment/Interventions  ADLs/Self Care Home Management;Electrical Stimulation;Cryotherapy;Iontophoresis 4mg /ml Dexamethasone;Moist Heat;Ultrasound;Neuromuscular re-education;Functional mobility training;Therapeutic activities;Therapeutic exercise;Manual techniques;Dry needling;Taping;Patient/family education    PT  Next Visit Plan  hows heel lift,  hip abductor and core strengthening/ hip stretching, modalities PRN , review seated physioball exercise, strengthen hip ER,     PT Home Exercise Plan  lower trunk rotation, posterior pelvic tilt, hamstring stretching, clams, adductor stretch/ sidelying hip abduction, piriformis, SKTC bridging.  eccentric clam, sit to stand, standing hip abduction/ extension, sidelying reverse clam shell.     Consulted and Agree with Plan of Care  Patient        Patient will benefit from skilled therapeutic intervention in order to improve the following deficits and impairments:  Abnormal gait, Pain, Difficulty walking, Decreased strength, Postural dysfunction, Improper body mechanics, Decreased balance, Decreased activity tolerance, Increased fascial restricitons, Increased muscle spasms, Hypomobility, Obesity  Visit Diagnosis: Abnormal posture  Muscle weakness (generalized)  Chronic bilateral low back pain, with sciatica presence unspecified     Problem List Patient Active Problem List   Diagnosis Date Noted  . Digital mucous cyst of finger of left hand 10/08/2017  . Acute medial meniscus tear of left knee 07/30/2017  . OSA on CPAP 08/26/2015  . Palpitation 08/21/2012  . Unspecified hypothyroidism 08/21/2012   Starr Lake PT, DPT, LAT, ATC  04/22/18  5:30 PM      [place], [place], [address] Phone: [phone]   Fax:  [phone]  Name: AYANNAH FADDIS MRN: [id number] Date of Birth: [birth year]

## 2018-04-24 ENCOUNTER — Encounter: Payer: Self-pay | Admitting: Podiatry

## 2018-04-24 ENCOUNTER — Ambulatory Visit: Payer: 59 | Admitting: Podiatry

## 2018-04-24 ENCOUNTER — Other Ambulatory Visit: Payer: Self-pay

## 2018-04-24 DIAGNOSIS — M779 Enthesopathy, unspecified: Secondary | ICD-10-CM | POA: Diagnosis not present

## 2018-04-24 DIAGNOSIS — J309 Allergic rhinitis, unspecified: Secondary | ICD-10-CM | POA: Insufficient documentation

## 2018-04-24 DIAGNOSIS — F419 Anxiety disorder, unspecified: Secondary | ICD-10-CM | POA: Insufficient documentation

## 2018-04-24 DIAGNOSIS — M47816 Spondylosis without myelopathy or radiculopathy, lumbar region: Secondary | ICD-10-CM | POA: Insufficient documentation

## 2018-04-24 MED ORDER — TRIAMCINOLONE ACETONIDE 10 MG/ML IJ SUSP
10.0000 mg | Freq: Once | INTRAMUSCULAR | Status: AC
  Administered 2018-04-24: 10 mg

## 2018-04-26 DIAGNOSIS — G4733 Obstructive sleep apnea (adult) (pediatric): Secondary | ICD-10-CM | POA: Diagnosis not present

## 2018-04-28 NOTE — Progress Notes (Signed)
Subjective:   Patient ID: Kayla Marshall, female   DOB: 67 y.o.   MRN: [id number]   HPI Patient presents with exquisite discomfort on the plantar lateral aspect of both feet and around the fifth metatarsal head with fluid buildup and pain with palpation.  Patient also has lesion formation   ROS      Objective:  Physical Exam  Inflammatory capsulitis fifth MPJ bilateral with lesion formation and probable plantar flexion of the metatarsals     Assessment:  Capsulitis fifth MPJ bilateral     Plan:  Discussed condition explained ultimate surgery and today injected the fifth MPJ plantar capsule bilateral 3 mg dexamethasone Kenalog 5 mg Xylocaine and went ahead and debrided lesions and advised on thick bottom shoes.  Reappoint to recheck as needed

## 2018-05-07 DIAGNOSIS — R1012 Left upper quadrant pain: Secondary | ICD-10-CM | POA: Diagnosis not present

## 2018-05-07 DIAGNOSIS — R7303 Prediabetes: Secondary | ICD-10-CM | POA: Diagnosis not present

## 2018-05-07 DIAGNOSIS — M47816 Spondylosis without myelopathy or radiculopathy, lumbar region: Secondary | ICD-10-CM | POA: Diagnosis not present

## 2018-05-08 ENCOUNTER — Ambulatory Visit: Payer: 59 | Admitting: Physical Therapy

## 2018-05-08 ENCOUNTER — Encounter: Payer: Self-pay | Admitting: Physical Therapy

## 2018-05-08 DIAGNOSIS — G8929 Other chronic pain: Secondary | ICD-10-CM

## 2018-05-08 DIAGNOSIS — M6281 Muscle weakness (generalized): Secondary | ICD-10-CM

## 2018-05-08 DIAGNOSIS — R293 Abnormal posture: Secondary | ICD-10-CM | POA: Diagnosis not present

## 2018-05-08 DIAGNOSIS — M545 Low back pain: Secondary | ICD-10-CM | POA: Diagnosis not present

## 2018-05-08 NOTE — Therapy (Signed)
[facility] [place], [place], [address] Phone: [phone]   Fax:  [phone]  Physical Therapy Treatment / Discharge Summary  Patient Details  Name: Kayla Marshall MRN: [id number] Date of Birth: 06/06/1951 Referring Provider: Frankey Shown MD   Encounter Date: 05/08/2018  PT End of Session - 05/08/18 1420    Visit Number  14    Number of Visits  14    Date for PT Re-Evaluation  05/08/18    PT Start Time  7517    PT Stop Time  1501    PT Time Calculation (min)  41 min    Activity Tolerance  Patient tolerated treatment well    Behavior During Therapy  [facility] for tasks assessed/performed       Past Medical History:  Diagnosis Date  . Anxiety   . Arthritis    back  . GERD (gastroesophageal reflux disease)   . Glaucoma   . Hypertension   . Hypothyroidism   . Obesity   . PPH (postpartum hemorrhage)   . Sciatic pain   . Sciatica   . Shortness of breath dyspnea   . Sleep apnea     Past Surgical History:  Procedure Laterality Date  . CHOLECYSTECTOMY  1990  . DILATATION & CURETTAGE/HYSTEROSCOPY WITH MYOSURE N/A 11/12/2015   Procedure: DILATATION & CURETTAGE/HYSTEROSCOPY WITH  MYOSURE;  Surgeon: Thurnell Lose, MD;  Location: [facility];  Service: Gynecology;  Laterality: N/A;    There were no vitals filed for this visit.  Subjective Assessment - 05/08/18 1419    Subjective  "I still have some pain in the low back, and some numbness in the leg with driving. I still try do  the exercises at home"     Currently in Pain?  Yes    Pain Score  10-Worst pain ever    Pain Location  Back    Pain Orientation  Right    Pain Descriptors / Indicators  Tightness;Sore    Pain Type  Chronic pain    Pain Onset  More than a month ago    Pain Frequency  Intermittent    Aggravating Factors   doing too much     Pain Relieving Factors  medication         [facility] PT Assessment - 05/08/18 1426      Observation/Other Assessments    Focus on Therapeutic Outcomes (FOTO)   47% limited      AROM   Lumbar Flexion  90    Lumbar Extension  26    Lumbar - Right Side Bend  18    Lumbar - Left Side Bend  18      Strength   Right Hip Flexion  5/5    Right Hip Extension  5/5    Right Hip ABduction  4/5    Left Hip Flexion  5/5    Left Hip Extension  4/5                   [facility] Adult PT Treatment/Exercise - 05/08/18 1441      Lumbar Exercises: Aerobic   Nustep  L5 x 8 min UE/LE only      Lumbar Exercises: Supine   Bridge  10 reps x 2 sets      Knee/Hip Exercises: Sidelying   Clams  2 x 15 R side only    Other Sidelying Knee/Hip Exercises  reverse clamshells 2 x 15      Manual Therapy  Manual therapy comments  DTM along the R glute med and piriformis              PT Education - 05/08/18 1502    Education provided  Yes    Education Details  reviewed previously provided HEP and discussed benefits of progression of HEP for endurance. benefits of getting in with a gym for strengthening.     Person(s) Educated  Patient    Methods  Explanation;Verbal cues;Handout    Comprehension  Verbalized understanding;Verbal cues required       PT Short Term Goals - 04/01/18 1447      PT SHORT TERM GOAL #1   Title  pt to be I with initial HEP    Time  3    Period  Weeks    Status  Achieved      PT SHORT TERM GOAL #2   Title  pt to verbalize and demo proper posture with sitting/ standing and utilize proper mechanics with lifting activities to prevent and reduce low back pain    Time  3    Period  Weeks    Status  Achieved      PT SHORT TERM GOAL #3   Title  pt to reduce paraspinal muscle spasm to promote trunk mobility and reduce pain    Period  Weeks    Status  Achieved        PT Long Term Goals - 05/08/18 1443      PT LONG TERM GOAL #1   Title  increase trunk flexion to >/= 70 degrees and extension / bil side bending by >/= 20 degrees with </= 2/10 pain to promote functional mobility  required for ADLs     Time  6    Period  Weeks    Status  Achieved      PT LONG TERM GOAL #2   Title  pt to be able to sit/ stand and walk for >/= 60 min with </= 2/10 pain for functional endurance required for ADLs and work related activites    Time  3    Period  Weeks    Status  Achieved      PT LONG TERM GOAL #3   Title  pt to increase FOTO score to </= 50% limited to demo improvement in function    Time  6    Period  Weeks    Status  Achieved      PT LONG TERM GOAL #4   Title  pt to be I with all HEP given as of last visit    Time  6    Period  Weeks    Status  Achieved      PT LONG TERM GOAL #5   Title  improve hip strength to >/= 4+/5 in all planes to provide hip stability with walking/ standing activities     Time  3    Period  Weeks    Status  Partially Met            Plan - 05/08/18 1447    Clinical Impression Statement  Kayla Marshall reports 10/10 pain likely due to doing more activity around her apartment and is likely due to overuse. following Aerobic exericse and stretching she reported decreased pain and soreness. she continues to demonstrate mild limitation with R hip soreness/ weakness intermittently. She performed all exercises wth no report of pain during or following activity. she met or partially met all goals today and is  able to maintain and progress current level of function and will be discharged from PT today.    PT Treatment/Interventions  ADLs/Self Care Home Management;Electrical Stimulation;Cryotherapy;Iontophoresis 60m/ml Dexamethasone;Moist Heat;Ultrasound;Neuromuscular re-education;Functional mobility training;Therapeutic activities;Therapeutic exercise;Manual techniques;Dry needling;Taping;Patient/family education    PT Next Visit Plan  D/C    PT Home Exercise Plan  lower trunk rotation, posterior pelvic tilt, hamstring stretching, clams, adductor stretch/ sidelying hip abduction, piriformis, SKTC bridging.  eccentric clam, sit to stand, standing  hip abduction/ extension, sidelying reverse clam shell.     Consulted and Agree with Plan of Care  Patient       Patient will benefit from skilled therapeutic intervention in order to improve the following deficits and impairments:  Abnormal gait, Pain, Difficulty walking, Decreased strength, Postural dysfunction, Improper body mechanics, Decreased balance, Decreased activity tolerance, Increased fascial restricitons, Increased muscle spasms, Hypomobility, Obesity  Visit Diagnosis: Abnormal posture  Muscle weakness (generalized)  Chronic bilateral low back pain, with sciatica presence unspecified     Problem List Patient Active Problem List   Diagnosis Date Noted  . Lumbar spondylosis 04/24/2018  . Allergic rhinitis 04/24/2018  . Anxiety disorder 04/24/2018  . Digital mucous cyst of finger of left hand 10/08/2017  . Acute medial meniscus tear of left knee 07/30/2017  . OSA on CPAP 08/26/2015  . Essential (primary) hypertension 04/05/2015  . Palpitation 08/21/2012  . Unspecified hypothyroidism 08/21/2012    LStarr Lake6/19/2019, 3:05 PM  [facility] [place] [address]Phone: [phone]  Fax:  [phone] Name: Kayla AZIZIMRN: [id number] of Birth: 304-06-1951       PHYSICAL THERAPY DISCHARGE SUMMARY  Visits from Start of Care: 14  Current functional level related to goals / functional outcomes: See goals   Remaining deficits: Intermittent R hip / flank pain that is reduced with controlled exercise and stretching. Limited endurance with prolonged weight bearing activities   Education / Equipment: HEP, posture, theraband,   Plan: Patient agrees to discharge.  Patient goals were partially met. Patient is being discharged due to meeting the stated rehab goals.  ?????         Kayla Marshall PT, DPT, LAT, ATC  05/08/18  3:07 PM

## 2018-05-15 DIAGNOSIS — N898 Other specified noninflammatory disorders of vagina: Secondary | ICD-10-CM | POA: Diagnosis not present

## 2018-05-15 DIAGNOSIS — N950 Postmenopausal bleeding: Secondary | ICD-10-CM | POA: Diagnosis not present

## 2018-05-17 ENCOUNTER — Other Ambulatory Visit (INDEPENDENT_AMBULATORY_CARE_PROVIDER_SITE_OTHER): Payer: Self-pay | Admitting: Orthopaedic Surgery

## 2018-05-17 MED FILL — FLUCONAZOLE 150 MG TABS: 150 | 6 days supply | Qty: 2 | Fill #0

## 2018-05-17 MED FILL — tiZANidine HCL 2 MG TABS: 2 | 30 days supply | Qty: 30 | Fill #0

## 2018-05-20 DIAGNOSIS — R0609 Other forms of dyspnea: Secondary | ICD-10-CM | POA: Diagnosis not present

## 2018-05-20 DIAGNOSIS — I10 Essential (primary) hypertension: Secondary | ICD-10-CM | POA: Diagnosis not present

## 2018-05-20 DIAGNOSIS — I358 Other nonrheumatic aortic valve disorders: Secondary | ICD-10-CM | POA: Diagnosis not present

## 2018-05-20 MED FILL — IBUPROFEN 800 MG TAB: 800 | 20 days supply | Qty: 60 | Fill #0

## 2018-05-29 DIAGNOSIS — N950 Postmenopausal bleeding: Secondary | ICD-10-CM | POA: Diagnosis not present

## 2018-06-07 MED FILL — LISINOPRIL 20 MG TABLET: 20 | 90 days supply | Qty: 90 | Fill #1

## 2018-06-20 DIAGNOSIS — S39012A Strain of muscle, fascia and tendon of lower back, initial encounter: Secondary | ICD-10-CM | POA: Diagnosis not present

## 2018-07-03 ENCOUNTER — Ambulatory Visit (INDEPENDENT_AMBULATORY_CARE_PROVIDER_SITE_OTHER): Payer: 59 | Admitting: Orthopaedic Surgery

## 2018-07-03 ENCOUNTER — Ambulatory Visit (INDEPENDENT_AMBULATORY_CARE_PROVIDER_SITE_OTHER): Payer: 59

## 2018-07-03 ENCOUNTER — Encounter (INDEPENDENT_AMBULATORY_CARE_PROVIDER_SITE_OTHER): Payer: Self-pay | Admitting: Orthopaedic Surgery

## 2018-07-03 DIAGNOSIS — G8929 Other chronic pain: Secondary | ICD-10-CM | POA: Diagnosis not present

## 2018-07-03 DIAGNOSIS — M25561 Pain in right knee: Secondary | ICD-10-CM

## 2018-07-03 DIAGNOSIS — M545 Low back pain: Secondary | ICD-10-CM

## 2018-07-03 MED ORDER — METHYLPREDNISOLONE ACETATE 40 MG/ML IJ SUSP
40.0000 mg | INTRAMUSCULAR | Status: AC | PRN
  Administered 2018-07-03: 40 mg via INTRA_ARTICULAR

## 2018-07-03 MED ORDER — LIDOCAINE HCL 1 % IJ SOLN
2.0000 mL | INTRAMUSCULAR | Status: AC | PRN
  Administered 2018-07-03: 2 mL

## 2018-07-03 MED ORDER — BUPIVACAINE HCL 0.5 % IJ SOLN
2.0000 mL | INTRAMUSCULAR | Status: AC | PRN
  Administered 2018-07-03: 2 mL via INTRA_ARTICULAR

## 2018-07-03 NOTE — Progress Notes (Signed)
Office Visit Note   Patient: Kayla Marshall           Date of Birth: [birth year]           MRN: [id number] Visit Date: 07/03/2018              Requested by: No referring provider defined for this encounter. PCP: Kayla Ripper, RN (Inactive)   Assessment & Plan: Visit Diagnoses:  1. Chronic pain of right knee   2. Acute pain of right knee   3. Acute low back pain, unspecified back pain laterality, with sciatica presence unspecified     Plan: Impression is right knee pain possible degenerative medial meniscal tear and low back pain with radiculopathy.  She has had intolerable side effects from prednisone taper in the past and therefore she would not like to have anymore.  I did offer her repeat MRI versus an epidural steroid injection based on evaluation with Dr. Ernestina Marshall she will think about this.  In terms of the right knee we did perform a cortisone injection today which she tolerated well.  If her left knee continues to bother her in the next several weeks we can consider an injection for this but I do not want to do one today because she is prediabetic.  Follow-Up Instructions: Return if symptoms worsen or fail to improve.   Orders:  Orders Placed This Encounter  Procedures  . XR Lumbar Spine 2-3 Views  . XR KNEE 3 VIEW RIGHT   No orders of the defined types were placed in this encounter.     Procedures: Large Joint Inj: R knee on 07/03/2018 5:28 PM Indications: pain Details: 22 G needle  Arthrogram: No  Medications: 40 mg methylPREDNISolone acetate 40 MG/ML; 2 mL lidocaine 1 %; 2 mL bupivacaine 0.5 % Consent was given by the patient. Patient was prepped and draped in the usual sterile fashion.       Clinical Data: No additional findings.   Subjective: Chief Complaint  Patient presents with  . Right Hip - Pain  . Right Knee - Pain  . Lower Back - Pain    Kayla Marshall is coming in today for right knee and low back pain.  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  She complains of medial sided right knee pain without swelling or mechanical symptoms.  She is also complaining of low back pain that radiates down the leg.  Previous MRI was completed in 2016.  She denies any focal motor or sensory deficits or bowel bladder dysfunction or constitutional symptoms.   Review of Systems  Constitutional: Negative.   HENT: Negative.   Eyes: Negative.   Respiratory: Negative.   Cardiovascular: Negative.   Endocrine: Negative.   Musculoskeletal: Negative.   Neurological: Negative.   Hematological: Negative.   Psychiatric/Behavioral: Negative.   All other systems reviewed and are negative.    Objective: Vital Signs: There were no vitals taken for this visit.  Physical Exam  Constitutional: She is oriented to person, place, and time. She appears well-developed and well-nourished.  Pulmonary/Chest: Effort normal.  Neurological: She is alert and oriented to person, place, and time.  Skin: Skin is warm. Capillary refill takes less than 2 seconds.  Psychiatric: She has a normal mood and affect. Her behavior is normal. Judgment and thought content normal.  Nursing note and vitals reviewed.   Ortho Exam Right knee exam shows no joint effusion.  Mild medial joint line tenderness.  Collaterals and cruciates are stable.  Lumbar spine exam shows mild tenderness with negative sciatic tension signs.  No focal motor or sensory deficits. Specialty Comments:  No specialty comments available.  Imaging: Xr Knee 3 View Right  Result Date: 07/03/2018 No significant degenerative joint disease.  Xr Lumbar Spine 2-3 Views  Result Date: 07/03/2018 Anterolisthesis at L4-5 likely from degenerative spondylolisthesis    PMFS History: Patient Active Problem List   Diagnosis Date Noted  . Lumbar spondylosis 04/24/2018  . Allergic rhinitis 04/24/2018  . Anxiety disorder 04/24/2018  . Digital mucous cyst of finger of left hand  10/08/2017  . Acute medial meniscus tear of left knee 07/30/2017  . OSA on CPAP 08/26/2015  . Essential (primary) hypertension 04/05/2015  . Palpitation 08/21/2012  . Unspecified hypothyroidism 08/21/2012   Past Medical History:  Diagnosis Date  . Anxiety   . Arthritis    back  . GERD (gastroesophageal reflux disease)   . Glaucoma   . Hypertension   . Hypothyroidism   . Obesity   . PPH (postpartum hemorrhage)   . Sciatic pain   . Sciatica   . Shortness of breath dyspnea   . Sleep apnea     History reviewed. No pertinent family history.  Past Surgical History:  Procedure Laterality Date  . CHOLECYSTECTOMY  1990  . DILATATION & CURETTAGE/HYSTEROSCOPY WITH MYOSURE N/A 11/12/2015   Procedure: DILATATION & CURETTAGE/HYSTEROSCOPY WITH  MYOSURE;  Surgeon: Kayla Lose, MD;  Location: [facility];  Service: Gynecology;  Laterality: N/A;   Social History   Occupational History  . Not on file  Tobacco Use  . Smoking status: Never Smoker  . Smokeless tobacco: Never Used  Substance and Sexual Activity  . Alcohol use: Yes    Comment: occas  . Drug use: No  . Sexual activity: Yes    Birth control/protection: Post-menopausal

## 2018-07-03 NOTE — Progress Notes (Deleted)
   Office Visit Note   Patient: Kayla Marshall           Date of Birth: Dec 13, 1950           MRN: [id number] Visit Date: 07/03/2018              Requested by: No referring provider defined for this encounter. PCP: Vic Ripper, RN (Inactive)   Assessment & Plan: Visit Diagnoses:  1. Chronic pain of right knee   2. Acute pain of right knee   3. Acute low back pain, unspecified back pain laterality, with sciatica presence unspecified     Plan: ***  Follow-Up Instructions: No follow-ups on file.   Orders:  Orders Placed This Encounter  Procedures  . XR Lumbar Spine 2-3 Views  . XR KNEE 3 VIEW RIGHT   No orders of the defined types were placed in this encounter.     Procedures: No procedures performed   Clinical Data: No additional findings.   Subjective: Chief Complaint  Patient presents with  . Right Hip - Pain  . Right Knee - Pain  . Lower Back - Pain    HPI  Review of Systems   Objective: Vital Signs: There were no vitals taken for this visit.  Physical Exam  Ortho Exam  Specialty Comments:  No specialty comments available.  Imaging: No results found.   PMFS History: Patient Active Problem List   Diagnosis Date Noted  . Lumbar spondylosis 04/24/2018  . Allergic rhinitis 04/24/2018  . Anxiety disorder 04/24/2018  . Digital mucous cyst of finger of left hand 10/08/2017  . Acute medial meniscus tear of left knee 07/30/2017  . OSA on CPAP 08/26/2015  . Essential (primary) hypertension 04/05/2015  . Palpitation 08/21/2012  . Unspecified hypothyroidism 08/21/2012   Past Medical History:  Diagnosis Date  . Anxiety   . Arthritis    back  . GERD (gastroesophageal reflux disease)   . Glaucoma   . Hypertension   . Hypothyroidism   . Obesity   . PPH (postpartum hemorrhage)   . Sciatic pain   . Sciatica   . Shortness of breath dyspnea   . Sleep apnea     History reviewed. No pertinent family history.  Past Surgical History:    Procedure Laterality Date  . CHOLECYSTECTOMY  1990  . DILATATION & CURETTAGE/HYSTEROSCOPY WITH MYOSURE N/A 11/12/2015   Procedure: DILATATION & CURETTAGE/HYSTEROSCOPY WITH  MYOSURE;  Surgeon: Thurnell Lose, MD;  Location: [facility];  Service: Gynecology;  Laterality: N/A;   Social History   Occupational History  . Not on file  Tobacco Use  . Smoking status: Never Smoker  . Smokeless tobacco: Never Used  Substance and Sexual Activity  . Alcohol use: Yes    Comment: occas  . Drug use: No  . Sexual activity: Yes    Birth control/protection: Post-menopausal

## 2018-07-17 ENCOUNTER — Ambulatory Visit (INDEPENDENT_AMBULATORY_CARE_PROVIDER_SITE_OTHER): Payer: 59 | Admitting: Orthopaedic Surgery

## 2018-07-17 ENCOUNTER — Encounter (INDEPENDENT_AMBULATORY_CARE_PROVIDER_SITE_OTHER): Payer: Self-pay | Admitting: Orthopaedic Surgery

## 2018-07-17 ENCOUNTER — Telehealth (INDEPENDENT_AMBULATORY_CARE_PROVIDER_SITE_OTHER): Payer: Self-pay | Admitting: Orthopaedic Surgery

## 2018-07-17 ENCOUNTER — Telehealth (INDEPENDENT_AMBULATORY_CARE_PROVIDER_SITE_OTHER): Payer: Self-pay | Admitting: Radiology

## 2018-07-17 DIAGNOSIS — Z6841 Body Mass Index (BMI) 40.0 and over, adult: Secondary | ICD-10-CM

## 2018-07-17 DIAGNOSIS — G8929 Other chronic pain: Secondary | ICD-10-CM

## 2018-07-17 DIAGNOSIS — M25562 Pain in left knee: Secondary | ICD-10-CM | POA: Diagnosis not present

## 2018-07-17 MED ORDER — LIDOCAINE HCL 1 % IJ SOLN
2.0000 mL | INTRAMUSCULAR | Status: AC | PRN
  Administered 2018-07-17: 2 mL

## 2018-07-17 MED ORDER — BUPIVACAINE HCL 0.25 % IJ SOLN
2.0000 mL | INTRAMUSCULAR | Status: AC | PRN
  Administered 2018-07-17: 2 mL via INTRA_ARTICULAR

## 2018-07-17 MED ORDER — METHYLPREDNISOLONE ACETATE 40 MG/ML IJ SUSP
40.0000 mg | INTRAMUSCULAR | Status: AC | PRN
  Administered 2018-07-17: 40 mg via INTRA_ARTICULAR

## 2018-07-17 NOTE — Telephone Encounter (Signed)
done

## 2018-07-17 NOTE — Telephone Encounter (Signed)
Needs approval for bilateral knee visco injections

## 2018-07-17 NOTE — Telephone Encounter (Signed)
Please advise 

## 2018-07-17 NOTE — Telephone Encounter (Signed)
Patient is requesting a letter be signed by Dr. Erlinda Hong to exempt her from a training for work scheduled for this Friday. She said she would like to bring a draft of this letter by with the exact wording she needs and would like it printed on a letter head and signed. She would like this done today if possible. Just fyi. Patients # [phone]

## 2018-07-17 NOTE — Progress Notes (Signed)
Office Visit Note   Patient: Kayla Marshall           Date of Birth: 1951-02-24           MRN: [id number] Visit Date: 07/17/2018              Requested by: No referring provider defined for this encounter. PCP: Vic Ripper, RN (Inactive)   Assessment & Plan: Visit Diagnoses:  1. Chronic pain of left knee   2. Morbid obesity ([place])   3. Body mass index 40.0-44.9, adult ([facility])     Plan: Impression is primary localized osteoarthritis left knee.  Today, we will inject this with cortisone.  We will also send for approval for viscosupplementation injections to both knees.  She will follow-up with [place] as needed.  Follow-Up Instructions: Return if symptoms worsen or fail to improve.   Orders:  Orders Placed This Encounter  Procedures  . Large Joint Inj: L knee   No orders of the defined types were placed in this encounter.     Procedures: Large Joint Inj: L knee on 07/17/2018 3:48 PM Indications: pain Details: 22 G needle, anterolateral approach Medications: 2 mL lidocaine 1 %; 2 mL bupivacaine 0.25 %; 40 mg methylPREDNISolone acetate 40 MG/ML      Clinical Data: No additional findings.   Subjective: Chief Complaint  Patient presents with  . Left Knee - Pain  . Right Knee - Pain    HPI patient is a pleasant 67 year old female who presents to our clinic today with left knee pain.  This has been ongoing for the past few months and is progressively worsened.  Initially started when she climbed into the back of an EMS vehicle.  The pain she has is to the entire knee worse going up and down stairs and hills.  Nothing seems to make this better.  No previous cortisone injection.  Review of Systems as detailed in HPI.  All others reviewed and are negative.   Objective: Vital Signs: There were no vitals taken for this visit.  Physical Exam well-developed well-nourished female no acute distress.  Alert and oriented x3.  Ortho Exam examination of the left knee reveals no  effusion.  Range of motion 0 to 100 degrees.  No joint line tenderness mild patellofemoral crepitus.  She is neurovascular intact distally.  Specialty Comments:  No specialty comments available.  Imaging: No new imaging   PMFS History: Patient Active Problem List   Diagnosis Date Noted  . Chronic pain of left knee 07/17/2018  . Morbid obesity ([place]) 07/17/2018  . Body mass index 40.0-44.9, adult ([place]) 07/17/2018  . Lumbar spondylosis 04/24/2018  . Allergic rhinitis 04/24/2018  . Anxiety disorder 04/24/2018  . Digital mucous cyst of finger of left hand 10/08/2017  . Acute medial meniscus tear of left knee 07/30/2017  . OSA on CPAP 08/26/2015  . Essential (primary) hypertension 04/05/2015  . Palpitation 08/21/2012  . Unspecified hypothyroidism 08/21/2012   Past Medical History:  Diagnosis Date  . Anxiety   . Arthritis    back  . GERD (gastroesophageal reflux disease)   . Glaucoma   . Hypertension   . Hypothyroidism   . Obesity   . PPH (postpartum hemorrhage)   . Sciatic pain   . Sciatica   . Shortness of breath dyspnea   . Sleep apnea     History reviewed. No pertinent family history.  Past Surgical History:  Procedure Laterality Date  . CHOLECYSTECTOMY  1990  .  DILATATION & CURETTAGE/HYSTEROSCOPY WITH MYOSURE N/A 11/12/2015   Procedure: DILATATION & CURETTAGE/HYSTEROSCOPY WITH  MYOSURE;  Surgeon: Thurnell Lose, MD;  Location: [facility];  Service: Gynecology;  Laterality: N/A;   Social History   Occupational History  . Not on file  Tobacco Use  . Smoking status: Never Smoker  . Smokeless tobacco: Never Used  Substance and Sexual Activity  . Alcohol use: Yes    Comment: occas  . Drug use: No  . Sexual activity: Yes    Birth control/protection: Post-menopausal

## 2018-07-17 NOTE — Telephone Encounter (Signed)
sure

## 2018-07-19 MED FILL — IBUPROFEN 800 MG TAB: 800 | 20 days supply | Qty: 60 | Fill #1

## 2018-07-19 MED FILL — LATANOPROST 0.005% EYE DRP: 0.005 | 90 days supply | Qty: 8 | Fill #1

## 2018-07-23 NOTE — Telephone Encounter (Signed)
Noted  

## 2018-07-24 ENCOUNTER — Ambulatory Visit (INDEPENDENT_AMBULATORY_CARE_PROVIDER_SITE_OTHER): Payer: 59 | Admitting: Orthopaedic Surgery

## 2018-07-29 ENCOUNTER — Telehealth (INDEPENDENT_AMBULATORY_CARE_PROVIDER_SITE_OTHER): Payer: Self-pay

## 2018-07-29 NOTE — Telephone Encounter (Signed)
Submitted VOB for Monovisc, bilateral knee. 

## 2018-08-06 ENCOUNTER — Telehealth (INDEPENDENT_AMBULATORY_CARE_PROVIDER_SITE_OTHER): Payer: Self-pay

## 2018-08-06 NOTE — Telephone Encounter (Signed)
Patient is approved for Monovisc, bilateral knee. Buy & Bill Covered at 100% through insurance No Co-pay No PA required  Called and left a VM advising patient to call back to schedule an appointment with Dr. Erlinda Hong.

## 2018-08-09 DIAGNOSIS — H2513 Age-related nuclear cataract, bilateral: Secondary | ICD-10-CM | POA: Diagnosis not present

## 2018-08-09 DIAGNOSIS — H25013 Cortical age-related cataract, bilateral: Secondary | ICD-10-CM | POA: Diagnosis not present

## 2018-08-09 DIAGNOSIS — H04123 Dry eye syndrome of bilateral lacrimal glands: Secondary | ICD-10-CM | POA: Diagnosis not present

## 2018-08-09 DIAGNOSIS — H402231 Chronic angle-closure glaucoma, bilateral, mild stage: Secondary | ICD-10-CM | POA: Diagnosis not present

## 2018-08-12 DIAGNOSIS — F419 Anxiety disorder, unspecified: Secondary | ICD-10-CM | POA: Diagnosis not present

## 2018-08-12 DIAGNOSIS — H9313 Tinnitus, bilateral: Secondary | ICD-10-CM | POA: Diagnosis not present

## 2018-08-12 DIAGNOSIS — M25561 Pain in right knee: Secondary | ICD-10-CM | POA: Diagnosis not present

## 2018-08-12 DIAGNOSIS — I10 Essential (primary) hypertension: Secondary | ICD-10-CM | POA: Diagnosis not present

## 2018-08-12 DIAGNOSIS — H9193 Unspecified hearing loss, bilateral: Secondary | ICD-10-CM | POA: Diagnosis not present

## 2018-08-12 DIAGNOSIS — M25562 Pain in left knee: Secondary | ICD-10-CM | POA: Diagnosis not present

## 2018-08-12 DIAGNOSIS — M47816 Spondylosis without myelopathy or radiculopathy, lumbar region: Secondary | ICD-10-CM | POA: Diagnosis not present

## 2018-08-12 DIAGNOSIS — R7303 Prediabetes: Secondary | ICD-10-CM | POA: Diagnosis not present

## 2018-08-12 DIAGNOSIS — R197 Diarrhea, unspecified: Secondary | ICD-10-CM | POA: Diagnosis not present

## 2018-08-12 MED FILL — ALPRAZolam 0.25 MG TABS: 0.25 | 30 days supply | Qty: 30 | Fill #0

## 2018-08-22 ENCOUNTER — Telehealth: Payer: Self-pay | Admitting: *Deleted

## 2018-08-22 NOTE — Telephone Encounter (Signed)
Pt states she usually sees Dr. Paulla Dolly, but she is getting ready to got to [place], and she has painful corns and wanted to know what to put on them.

## 2018-08-22 NOTE — Telephone Encounter (Signed)
I called pt, informed that she could use OTC neosporin with lidocaine and pad area off and tape. Pt states she does use the pad and tape.

## 2018-09-06 DIAGNOSIS — Z23 Encounter for immunization: Secondary | ICD-10-CM | POA: Diagnosis not present

## 2018-09-06 MED FILL — IBUPROFEN 800 MG TAB: 800 | 20 days supply | Qty: 60 | Fill #2

## 2018-09-06 MED FILL — ALPRAZolam 0.25 MG TABS: 0.25 | 30 days supply | Qty: 30 | Fill #0

## 2018-09-06 MED FILL — LISINOPRIL 20 MG TABLET: 20 | 90 days supply | Qty: 90 | Fill #0

## 2018-09-11 DIAGNOSIS — G4730 Sleep apnea, unspecified: Secondary | ICD-10-CM | POA: Diagnosis not present

## 2018-09-11 DIAGNOSIS — H9313 Tinnitus, bilateral: Secondary | ICD-10-CM | POA: Diagnosis not present

## 2018-09-11 DIAGNOSIS — H6122 Impacted cerumen, left ear: Secondary | ICD-10-CM | POA: Diagnosis not present

## 2018-09-11 DIAGNOSIS — H903 Sensorineural hearing loss, bilateral: Secondary | ICD-10-CM | POA: Diagnosis not present

## 2018-09-20 DIAGNOSIS — M62830 Muscle spasm of back: Secondary | ICD-10-CM | POA: Diagnosis not present

## 2018-09-20 DIAGNOSIS — M9902 Segmental and somatic dysfunction of thoracic region: Secondary | ICD-10-CM | POA: Diagnosis not present

## 2018-09-20 DIAGNOSIS — M546 Pain in thoracic spine: Secondary | ICD-10-CM | POA: Diagnosis not present

## 2018-09-20 DIAGNOSIS — M9903 Segmental and somatic dysfunction of lumbar region: Secondary | ICD-10-CM | POA: Diagnosis not present

## 2018-09-27 MED FILL — LATANOPROST 0.005% EYE DRP: 0.005 | 75 days supply | Qty: 8 | Fill #2

## 2018-09-30 DIAGNOSIS — H903 Sensorineural hearing loss, bilateral: Secondary | ICD-10-CM | POA: Diagnosis not present

## 2018-09-30 DIAGNOSIS — N950 Postmenopausal bleeding: Secondary | ICD-10-CM | POA: Diagnosis not present

## 2018-12-04 MED FILL — LISINOPRIL 20 MG TABLET: 20 | 90 days supply | Qty: 90 | Fill #1

## 2018-12-13 MED FILL — LATANOPROST 0.005% EYE DRP: 0.005 | 50 days supply | Qty: 5 | Fill #4

## 2018-12-20 ENCOUNTER — Ambulatory Visit (INDEPENDENT_AMBULATORY_CARE_PROVIDER_SITE_OTHER): Payer: 59 | Admitting: Podiatry

## 2018-12-20 ENCOUNTER — Encounter: Payer: Self-pay | Admitting: Podiatry

## 2018-12-20 DIAGNOSIS — M779 Enthesopathy, unspecified: Secondary | ICD-10-CM

## 2018-12-20 MED ORDER — TRIAMCINOLONE ACETONIDE 10 MG/ML IJ SUSP
10.0000 mg | Freq: Once | INTRAMUSCULAR | Status: AC
  Administered 2018-12-20: 10 mg

## 2018-12-20 NOTE — Progress Notes (Signed)
Subjective:   Patient ID: Kayla Marshall, female   DOB: 68 y.o.   MRN: [id number]   HPI Patient states the fifth metatarsal on her left foot has really been bothering her and both of them have lesions underneath them   ROS      Objective:  Physical Exam  Neurovascular status intact with inflammation and fifth MPJ fluid buildup around the left fifth metatarsal with keratotic lesion fifth metatarsal bilateral     Assessment:  Capsulitis fifth MPJ left over right with pain     Plan:  Sterile prep and injected the sub-capsule area left 3 mg Dexasone Kenalog 5 mg Xylocaine and debrided lesions on both fifth metatarsals and reappoint as needed

## 2018-12-26 ENCOUNTER — Encounter: Payer: Self-pay | Admitting: Registered"

## 2018-12-26 ENCOUNTER — Encounter: Payer: Commercial Managed Care - PPO | Attending: Family Medicine | Admitting: Registered"

## 2018-12-26 DIAGNOSIS — Z713 Dietary counseling and surveillance: Secondary | ICD-10-CM

## 2018-12-26 NOTE — Progress Notes (Signed)
Medical Nutrition Therapy:  Appt start time: 1400 end time:  1610.  Assessment:  Primary concerns today: [employer] Employee Visit #1. Pt reports she would like to lose weight. She reports she has tried meal plans but they did not work. Pt reports she has been dx with prediabetes and would like to know how to prevent type 2 diabetes. Pt reports she has heard about the keto diet but has not wanted to follow it due to not having a gallbladder. Pt reports that she has sciatica which makes it hard for her to do exercises. She states her doctor recommended she try doing water aerobics, however, pt reports water aerobics would not work for her due to adverse reaction to chlorine. 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  Pt reports that she have an inconsistent eating schedule on days that she works-at work her lunch break is around 1:15 PM for 30 minutes. Pt reports that at work she doesn't have time to heat food for lunch so is limited to cold lunches. Reports she often does not have much time to eat breakfast before going to work. Reports she sometimes gets something in the hospital cafeteria. Pt reports she is often overly hungry by the time she gets home for dinner in the evening which is usually around 8 PM. Pt reports that she feels she has not been getting in enough water lately. Pt is not sure exactly how much she has been drinking.   Preferred Learning Style:   No preference indicated   Learning Readiness:   Ready  MEDICATIONS: See list. Reviewed.    DIETARY INTAKE:  Usual eating pattern includes 2-3 meals per day. Reports that she struggles with eating lunch on workdays because she does not have time to heat up food at work.   Location of Meals: Pt reports she does not have a dining room table and so she eats in living room in front of TV.  Beverages: water, 2 cups of coffee black.   Everyday foods include [place] bacon, scrambled egg, steamed kale or spinach  most days 4 days per week.  Avoided foods include cauliflower, collards, eggplant.    Breakfast on work days include: coffee, dry cereal at the cafeteria with skim milk  24-hr recall:  B ( AM):  4 pieces [place] bacons, 2 scrambled eggs, spinach, feta cheese, 2 cups black coffee  Snk ( AM): None reported.  L (2 PM): Triple Zero [place] yogurt, frozen blueberries Snk ( PM): None reported.  D ( PM): (chili) beef, beans, corn chips, water  Snk ( PM): None reported.  Beverages: ~12 oz x 6 water; 2 cups coffee   Usual physical activity: Has steps at apartment; reports she was doing 15 minutes 3 days per week last spring but has not been doing so now.   Estimated energy needs: ~1500 calories 169-206 g carbohydrates 82 g protein 33-58 g fat  Progress Towards Goal(s):  In progress.   Nutritional Diagnosis:  NB-1.1 Food and nutrition-related knowledge deficit As related to balanced nutrition and mindful eating .  As evidenced by pt has questions regarding how she should eat to lose weight and prevent diabetes .    Intervention:  Nutrition counseling provided. Dietitian provided education regarding balanced nutrition, mindful eating, and health benefits of and recommendations for physical activity. Discussed balanced and quick breakfast ideas and cold lunch ideas. Recommended pt pack a balanced snack to have in the late  afternoon to prevent becoming overly hungry at dinner time. Recommended not going more than 5 hours without eating to help keep appetite at normal level. Dietitian provided handout with chair exercises. Reommended consulting her doctor if any questions regarding exercise limitations. Recommended pt track water intake and ensure she drinks at least 64 oz daily. Pt appeared agreeable to information/goals discussed.   Instructions/Goals:  Make sure to get in three meals per day. Try to have balanced meals like the My Plate example (see handout). Include lean proteins, vegetables,  fruits, and whole grains at meals.   Goal: Have 3 meals per day:   Breakfast Ideas: Recommend having already prepped breakfast foods that you can easily grab and take with you in the morning-want to include balance like the plate (see handout): Ideas-Greek yogurt and fruit; banana, almond butter and whole grain sandwich; low fat/fat free cottage cheese and fruit with added nuts and/or chia and flax seeds; english muffin and almond butter, piece of fruit.   Pack a balanced lunch for [employer].   Recommend having a balanced snack if meals are more than 4-5 hours apart. Recommend having a snack 3-5 hours after lunch to prevent becoming overly hungry when you get home.   Recommend tracking water intake and aiming for at least 64 oz   Make physical activity a part of your week. Try to include at least 30 minutes of physical activity 5 days each week or at least 150 minutes per week. Regular physical activity promotes overall health-including helping to reduce risk for heart disease and diabetes, promoting mental health, and helping [place] sleep better.    Teaching Method Utilized:  Visual Auditory  Handouts given during visit include:  Balanced plate and food list.   Balanced snack sheet.   Chair Exercises   Barriers to learning/adherence to lifestyle change: Physical activity limitations.   Demonstrated degree of understanding via:  Teach Back   Monitoring/Evaluation:  Dietary intake, exercise, and body weight in 4 week(s).

## 2018-12-26 NOTE — Patient Instructions (Addendum)
Instructions/Goals:  Make sure to get in three meals per day. Try to have balanced meals like the My Plate example (see handout). Include lean proteins, vegetables, fruits, and whole grains at meals.   Goal: Have 3 meals per day:   Breakfast Ideas: Recommend having already prepped breakfast foods that you can easily grab and take with you in the morning-want to include balance like the plate (see handout): Ideas-Greek yogurt and fruit; banana, almond butter and whole grain sandwich; low fat/fat free cottage cheese and fruit with added nuts and/or chia and flax seeds; english muffin and almond butter, piece of fruit.   Pack a balanced lunch for [employer].   Recommend having a balanced snack if meals are more than 4-5 hours apart. Recommend having a snack 3-5 hours after lunch to prevent becoming overly hungry when you get home.   Recommend tracking water intake and aiming for at least 64 oz   Make physical activity a part of your week. Try to include at least 30 minutes of physical activity 5 days each week or at least 150 minutes per week. Regular physical activity promotes overall health-including helping to reduce risk for heart disease and diabetes, promoting mental health, and helping [place] sleep better.

## 2019-01-10 DIAGNOSIS — R350 Frequency of micturition: Secondary | ICD-10-CM | POA: Diagnosis not present

## 2019-01-10 DIAGNOSIS — F330 Major depressive disorder, recurrent, mild: Secondary | ICD-10-CM | POA: Diagnosis not present

## 2019-01-10 DIAGNOSIS — H9193 Unspecified hearing loss, bilateral: Secondary | ICD-10-CM | POA: Diagnosis not present

## 2019-01-10 DIAGNOSIS — I10 Essential (primary) hypertension: Secondary | ICD-10-CM | POA: Diagnosis not present

## 2019-01-10 DIAGNOSIS — F419 Anxiety disorder, unspecified: Secondary | ICD-10-CM | POA: Diagnosis not present

## 2019-01-10 DIAGNOSIS — R7303 Prediabetes: Secondary | ICD-10-CM | POA: Diagnosis not present

## 2019-01-10 DIAGNOSIS — G4733 Obstructive sleep apnea (adult) (pediatric): Secondary | ICD-10-CM | POA: Diagnosis not present

## 2019-01-13 ENCOUNTER — Encounter (INDEPENDENT_AMBULATORY_CARE_PROVIDER_SITE_OTHER): Payer: 59

## 2019-01-14 ENCOUNTER — Encounter (INDEPENDENT_AMBULATORY_CARE_PROVIDER_SITE_OTHER): Payer: 59

## 2019-01-16 ENCOUNTER — Encounter (INDEPENDENT_AMBULATORY_CARE_PROVIDER_SITE_OTHER): Payer: Self-pay | Admitting: Family Medicine

## 2019-01-16 ENCOUNTER — Ambulatory Visit (INDEPENDENT_AMBULATORY_CARE_PROVIDER_SITE_OTHER): Payer: 59 | Admitting: Family Medicine

## 2019-01-16 VITALS — BP 143/81 | HR 74 | Temp 98.1°F | Ht 63.0 in | Wt 235.0 lb

## 2019-01-16 DIAGNOSIS — R0602 Shortness of breath: Secondary | ICD-10-CM | POA: Diagnosis not present

## 2019-01-16 DIAGNOSIS — Z6841 Body Mass Index (BMI) 40.0 and over, adult: Secondary | ICD-10-CM | POA: Diagnosis not present

## 2019-01-16 DIAGNOSIS — F3289 Other specified depressive episodes: Secondary | ICD-10-CM

## 2019-01-16 DIAGNOSIS — R7303 Prediabetes: Secondary | ICD-10-CM

## 2019-01-16 DIAGNOSIS — G4733 Obstructive sleep apnea (adult) (pediatric): Secondary | ICD-10-CM

## 2019-01-16 DIAGNOSIS — Z9189 Other specified personal risk factors, not elsewhere classified: Secondary | ICD-10-CM

## 2019-01-16 DIAGNOSIS — R5383 Other fatigue: Secondary | ICD-10-CM

## 2019-01-16 DIAGNOSIS — Z0289 Encounter for other administrative examinations: Secondary | ICD-10-CM

## 2019-01-16 NOTE — Progress Notes (Signed)
.  Office: [phone]  /  Fax: [phone]   HPI:   Chief Complaint: OBESITY  Kayla Marshall (MR# [id number]) is a 68 y.o. female who presents on 01/16/2019 for obesity evaluation and treatment. Current BMI is Body mass index is 41.63 kg/m.Marland Kitchen Kayla Marshall has struggled with obesity for years and has been unsuccessful in either losing weight or maintaining long term weight loss. Kayla Marshall attended our information session and states she is currently in the action stage of change and ready to dedicate time achieving and maintaining a healthier weight.  Kayla Marshall heard about our clinic from a friend. She is lactose intolerance, but she can eat cottage cheese, yogurt, and cheese.   Kayla Marshall states her desired weight loss is 105 lbs she started gaining weight during divorce her heaviest weight ever was 238 lbs she has significant food cravings issues  she snacks frequently in the evenings she is frequently drinking liquids with calories she struggles with emotional eating    Fatigue Kayla Marshall feels her energy is lower than it should be. This has worsened with weight gain and has not worsened recently. Kayla Marshall admits to daytime somnolence and  admits to waking up still tired. Patient has a history of obstructive sleep apnea with the use of CPAP. Patent has a history of symptoms of daytime fatigue and morning headache. Patient generally gets 6 hours of sleep per night, and states they generally have generally restful sleep. Snoring is present. Apneic episodes are present. Epworth Sleepiness Score is 12.  Dyspnea on exertion Kayla Marshall notes increasing shortness of breath with exercising and seems to be worsening over time with weight gain. She notes getting out of breath sooner with activity than she used to. This has not gotten worse recently. EKG-Normal sinus rhythm at 69 BPM. Kayla Marshall denies orthopnea.  Pre-Diabetes Kayla Marshall has a diagnosis of pre-diabetes based on her elevated Hgb A1c and was informed this puts her at greater  risk of developing diabetes. She has had pre-diabetes for a few years. She is not taking metformin currently and continues to work on diet and exercise to decrease risk of diabetes. She denies nausea or hypoglycemia.  At risk for diabetes Kayla Marshall is at higher than average risk for developing diabetes due to her obesity and pre-diabetes. She currently denies polyuria or polydipsia.  Obstructive Sleep Apnea Kayla Marshall has a history of obstructive sleep apnea for >10 years. She has been mostly compliant.  Depression with emotional eating behaviors Kayla Marshall is struggling with mindless emotional eating and using food for comfort to the extent that it is negatively impacting her health. She often snacks when she is not hungry. Kayla Marshall sometimes feels she is out of control and then feels guilty that she made poor food choices. She has been working on behavior modification techniques to help reduce her emotional eating and has been somewhat successful. She shows no sign of suicidal or homicidal ideations.  Depression Screen Kayla Marshall's Food and Mood (modified PHQ-9) score was  Depression screen PHQ 2/9 01/16/2019  Decreased Interest 3  Down, Depressed, Hopeless 3  PHQ - 2 Score 6  Altered sleeping 3  Tired, decreased energy 3  Change in appetite 2  Feeling bad or failure about yourself  3  Trouble concentrating 1  Moving slowly or fidgety/restless 3  Suicidal thoughts 0  PHQ-9 Score 21  Difficult doing work/chores Extremely dIfficult    ASSESSMENT AND PLAN:  Other fatigue - Plan: EKG 12-Lead, VITAMIN D 25 Hydroxy (Vit-D Deficiency, Fractures), Vitamin B12, Folate, T3, T4, free  Shortness of breath on exertion  Prediabetes - Plan: Insulin, random  OSA (obstructive sleep apnea)  Other depression - with emotional eating  At risk for diabetes mellitus  Class 3 severe obesity with serious comorbidity and body mass index (BMI) of 40.0 to 44.9 in adult, unspecified obesity type  (Kayla Marshall)  PLAN:  Fatigue Kayla Marshall was informed that her fatigue may be related to obesity, depression or many other causes. Labs will be ordered, and in the meanwhile Abreanna has agreed to work on diet, exercise and weight loss to help with fatigue. Proper sleep hygiene was discussed including the need for 7-8 hours of quality sleep each night. A sleep study was not ordered based on symptoms and Epworth score.  Dyspnea on exertion Kayla Marshall's shortness of breath appears to be obesity related and exercise induced. She has agreed to work on weight loss and gradually increase exercise to treat her exercise induced shortness of breath. If Kayla Marshall follows our instructions and loses weight without improvement of her shortness of breath, we will plan to refer to pulmonology. We will monitor this condition regularly. Kayla Marshall agrees to this plan.  Pre-Diabetes Kayla Marshall will continue to work on weight loss, exercise, and decreasing simple carbohydrates in her diet to help decrease the risk of diabetes. We dicussed metformin including benefits and risks. She was informed that eating too many simple carbohydrates or too many calories at one sitting increases the likelihood of GI side effects. Kayla Marshall declined metformin for now and a prescription was not written today. We will check insulin level today. Kayla Marshall agrees to follow up with our clinic in 2 weeks as directed to monitor her progress.  Diabetes risk counseling Kayla Marshall was given extended (15 minutes) diabetes prevention counseling today. She is 68 y.o. female and has risk factors for diabetes including obesity and pre-diabetes. We discussed intensive lifestyle modifications today with an emphasis on weight loss as well as increasing exercise and decreasing simple carbohydrates in her diet.  Obstructive Sleep Apnea Kayla Marshall is to follow up with sleep doctor as needed, and she agrees to follow up with our clinic in 2 weeks.  Depression with Emotional Eating Behaviors We discussed  behavior modification techniques today to help Kayla Marshall deal with her emotional eating and depression. We will refer to Dr. Mallie Mussel, our bariatric psychologist. Kayla Marshall agrees to follow up with our clinic in 2 weeks.  Depression Screen Kayla Marshall had a strongly positive depression screening. Depression is commonly associated with obesity and often results in emotional eating behaviors. We will monitor this closely and work on CBT to help improve the non-hunger eating patterns. Referral to Psychology may be required if no improvement is seen as she continues in our clinic.  Obesity Kayla Marshall is currently in the action stage of change and her goal is to continue with weight loss efforts She has agreed to follow the Category 2 plan Kayla Marshall has been instructed to work up to a goal of 150 minutes of combined cardio and strengthening exercise per week for weight loss and overall health benefits. We discussed the following Behavioral Modification Strategies today: increasing lean protein intake, increasing vegetables, work on meal planning and easy cooking plans, better snacking choices, and planning for success  Kayla Marshall has agreed to follow up with our clinic in 2 weeks. She was informed of the importance of frequent follow up visits to maximize her success with intensive lifestyle modifications for her multiple health conditions. She was informed we would discuss her lab results at her next visit unless there  is a critical issue that needs to be addressed sooner. Kayla Marshall agreed to keep her next visit at the agreed upon time to discuss these results.  ALLERGIES: Allergies  Allergen Reactions  . Erythromycin     "Rectal bleeding."  . Avelox [Moxifloxacin Hcl In Nacl]   . Meloxicam     Dizziness.   . Moxifloxacin     Other reaction(s): presyncopal    MEDICATIONS: Current Outpatient Medications on File Prior to Visit  Medication Sig Dispense Refill  . ALPRAZolam (XANAX) 0.25 MG tablet Take 0.25 mg by mouth 2 (two)  times daily as needed for anxiety or sleep.   0  . latanoprost (XALATAN) 0.005 % ophthalmic solution Place 0.005 drops into both eyes at bedtime.  4  . lisinopril (PRINIVIL,ZESTRIL) 20 MG tablet   1   No current facility-administered medications on file prior to visit.     PAST MEDICAL HISTORY: Past Medical History:  Diagnosis Date  . Allergies   . Anemia   . Anxiety   . Arthritis    back  . B12 deficiency   . Back pain   . Chewing difficulty   . Cold intolerance   . Depression   . Dry mouth   . Fatigue   . Gallbladder disease   . GERD (gastroesophageal reflux disease)   . Glaucoma   . Hay fever   . Headache   . Hoarseness   . Hypertension   . Hypothyroidism   . Irritable bowel   . Joint pain   . Lactose intolerance   . Leg cramps   . Muscle stiffness   . Nervousness   . Obesity   . Palpitations   . PPH (postpartum hemorrhage)   . Prediabetes   . Sciatic pain   . Sciatica   . Shortness of breath dyspnea   . Sleep apnea   . Sleep apnea   . Stress   . Swelling of lower extremity   . Tinnitus   . Torn meniscus    left  . Vitamin D deficiency   . Weakness     PAST SURGICAL HISTORY: Past Surgical History:  Procedure Laterality Date  . CHOLECYSTECTOMY  1990  . DILATATION & CURETTAGE/HYSTEROSCOPY WITH MYOSURE N/A 11/12/2015   Procedure: DILATATION & CURETTAGE/HYSTEROSCOPY WITH  MYOSURE;  Surgeon: Thurnell Lose, MD;  Location: [facility];  Service: Gynecology;  Laterality: N/A;    SOCIAL HISTORY: Social History   Tobacco Use  . Smoking status: Never Smoker  . Smokeless tobacco: Never Used  Substance Use Topics  . Alcohol use: Yes    Comment: occas  . Drug use: No    FAMILY HISTORY: Family History  Problem Relation Age of Onset  . COPD Mother   . Stroke Mother   . Hypertension Mother   . Anxiety disorder Mother   . COPD Father   . Heart disease Father   . Depression Father   . Cancer Sister     ROS: Review of Systems  Constitutional:  Positive for malaise/fatigue. Negative for weight loss.  HENT: Positive for tinnitus.        + Nasal stuffiness + Hay fever + Dry mouth + Hoarseness  Eyes:       + Wear glasses or contacts  Respiratory: Positive for shortness of breath (with exertion).   Cardiovascular: Negative for orthopnea.       + Leg cramping  Gastrointestinal: Negative for nausea.  Genitourinary: Negative for frequency.  Musculoskeletal: Positive for back pain.       +  Muscle stiffness  Skin:       + Dryness  Neurological: Positive for weakness and headaches.  Endo/Heme/Allergies: Negative for polydipsia.       Negative hypoglycemia + Heat/cold intolerance  Psychiatric/Behavioral: Positive for depression. Negative for suicidal ideas. The patient is nervous/anxious.        + Stress    PHYSICAL EXAM: Blood pressure (!) 143/81, pulse 74, temperature 98.1 F (36.7 C), temperature source Oral, height 5\' 3"  (1.6 m), weight 235 lb (106.6 kg), SpO2 97 %. Body mass index is 41.63 kg/m. Physical Exam Vitals signs reviewed.  Constitutional:      Appearance: Normal appearance. She is obese.  HENT:     Head: Normocephalic and atraumatic.     Nose: Nose normal.  Eyes:     General: No scleral icterus.    Extraocular Movements: Extraocular movements intact.  Neck:     Musculoskeletal: Normal range of motion and neck supple.     Comments: No thyromegaly present Cardiovascular:     Rate and Rhythm: Normal rate and regular rhythm.     Pulses: Normal pulses.     Heart sounds: Normal heart sounds.  Pulmonary:     Effort: Pulmonary effort is normal. No respiratory distress.     Breath sounds: Normal breath sounds.  Abdominal:     Palpations: Abdomen is soft.     Tenderness: There is no abdominal tenderness.     Comments: + Obesity  Musculoskeletal: Normal range of motion.     Right lower leg: No edema.     Left lower leg: No edema.  Skin:    General: Skin is warm and dry.  Neurological:     Mental Status:  She is alert and oriented to person, place, and time.     Coordination: Coordination normal.  Psychiatric:        Mood and Affect: Mood normal.        Behavior: Behavior normal.     RECENT LABS AND TESTS: BMET    Component Value Date/Time   NA 140 11/05/2015 1415   K 4.0 11/05/2015 1415   CL 104 11/05/2015 1415   CO2 25 11/05/2015 1415   GLUCOSE 120 (H) 11/05/2015 1415   BUN 17 11/05/2015 1415   CREATININE 0.68 11/05/2015 1415   CALCIUM 9.2 11/05/2015 1415   GFRNONAA >60 11/05/2015 1415   GFRAA >60 11/05/2015 1415   No results found for: HGBA1C No results found for: INSULIN CBC    Component Value Date/Time   WBC 8.5 11/05/2015 1415   RBC 4.27 11/05/2015 1415   HGB 13.1 11/05/2015 1415   HCT 40.2 11/05/2015 1415   PLT 295 11/05/2015 1415   MCV 94.1 11/05/2015 1415   MCH 30.7 11/05/2015 1415   MCHC 32.6 11/05/2015 1415   RDW 14.5 11/05/2015 1415   LYMPHSABS 1.3 10/22/2015 0933   MONOABS 0.6 10/22/2015 0933   EOSABS 0.1 10/22/2015 0933   BASOSABS 0.0 10/22/2015 0933   Iron/TIBC/Ferritin/ %Sat No results found for: IRON, TIBC, FERRITIN, IRONPCTSAT Lipid Panel  No results found for: CHOL, TRIG, HDL, CHOLHDL, VLDL, LDLCALC, LDLDIRECT Hepatic Function Panel  No results found for: PROT, ALBUMIN, AST, ALT, ALKPHOS, BILITOT, BILIDIR, IBILI    Component Value Date/Time   TSH 1.225 08/21/2012 0254    ECG  shows NSR with a rate of 69 BPM INDIRECT CALORIMETER done today shows a VO2 of 202 and a REE of 1403. Her calculated basal metabolic rate is 4315 thus her basal metabolic rate  is worse than expected.       OBESITY BEHAVIORAL INTERVENTION VISIT  Today's visit was # 1   Starting weight: 235 lbs Starting date: 01/16/2019 Today's weight : 235 lbs  Today's date: 01/16/2019 Total lbs lost to date: 0    01/16/2019  Height 5\' 3"  (1.6 m)  Weight 235 lb (106.6 kg)  BMI (Calculated) 41.64  BLOOD PRESSURE - SYSTOLIC 517  BLOOD PRESSURE - DIASTOLIC 81  Waist  Measurement  50 inches   Body Fat % 49.6 %  Total Body Water (lbs) 82.4 lbs  RMR 1403     ASK: We discussed the diagnosis of obesity with Versie Starks today and Kayla Marshall agreed to give [place] permission to discuss obesity behavioral modification therapy today.  ASSESS: Keniah has the diagnosis of obesity and her BMI today is 41.64 Jalaya is in the action stage of change   ADVISE: Kayla Marshall was educated on the multiple health risks of obesity as well as the benefit of weight loss to improve her health. She was advised of the need for long term treatment and the importance of lifestyle modifications to improve her current health and to decrease her risk of future health problems.  AGREE: Multiple dietary modification options and treatment options were discussed and  Kayla Marshall agreed to follow the recommendations documented in the above note.  ARRANGE: Deshana was educated on the importance of frequent visits to treat obesity as outlined per CMS and USPSTF guidelines and agreed to schedule her next follow up appointment today.   I, Trixie Dredge, am acting as transcriptionist for Ilene Qua, MD   I have reviewed the above documentation for accuracy and completeness, and I agree with the above. - Ilene Qua, MD

## 2019-01-17 ENCOUNTER — Other Ambulatory Visit: Payer: Self-pay | Admitting: Cardiology

## 2019-01-17 DIAGNOSIS — I6521 Occlusion and stenosis of right carotid artery: Secondary | ICD-10-CM

## 2019-01-19 LAB — VITAMIN B12: Vitamin B-12: 691 pg/mL (ref 232–1245)

## 2019-01-19 LAB — T4, FREE: Free T4: 1.12 ng/dL (ref 0.82–1.77)

## 2019-01-19 LAB — VITAMIN D 25 HYDROXY (VIT D DEFICIENCY, FRACTURES): Vit D, 25-Hydroxy: 52.6 ng/mL (ref 30.0–100.0)

## 2019-01-19 LAB — T3: T3, Total: 117 ng/dL (ref 71–180)

## 2019-01-19 LAB — FOLATE: Folate: 12.3 ng/mL (ref >3.0)

## 2019-01-19 LAB — INSULIN, RANDOM: INSULIN: 28.1 u[IU]/mL — AB (ref 2.6–24.9)

## 2019-01-20 ENCOUNTER — Encounter (INDEPENDENT_AMBULATORY_CARE_PROVIDER_SITE_OTHER): Payer: Self-pay

## 2019-01-21 ENCOUNTER — Telehealth: Payer: Self-pay

## 2019-01-22 MED FILL — LATANOPROST 0.005% EYE DRP: 0.005 | 75 days supply | Qty: 8 | Fill #0

## 2019-01-23 ENCOUNTER — Encounter (INDEPENDENT_AMBULATORY_CARE_PROVIDER_SITE_OTHER): Payer: Self-pay | Admitting: Family Medicine

## 2019-01-23 ENCOUNTER — Ambulatory Visit: Payer: Commercial Managed Care - PPO | Admitting: Registered"

## 2019-01-30 ENCOUNTER — Ambulatory Visit (INDEPENDENT_AMBULATORY_CARE_PROVIDER_SITE_OTHER): Payer: 59 | Admitting: Family Medicine
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
## 2019-02-04 MED FILL — ALPRAZolam 0.25 MG TABS: 0.25 | 30 days supply | Qty: 30 | Fill #0

## 2019-02-05 ENCOUNTER — Encounter (INDEPENDENT_AMBULATORY_CARE_PROVIDER_SITE_OTHER): Payer: Self-pay | Admitting: Family Medicine

## 2019-02-05 ENCOUNTER — Ambulatory Visit (INDEPENDENT_AMBULATORY_CARE_PROVIDER_SITE_OTHER): Payer: 59 | Admitting: Family Medicine

## 2019-02-05 ENCOUNTER — Other Ambulatory Visit: Payer: Self-pay

## 2019-02-05 ENCOUNTER — Ambulatory Visit (INDEPENDENT_AMBULATORY_CARE_PROVIDER_SITE_OTHER): Payer: 59 | Admitting: Psychology

## 2019-02-05 VITALS — BP 132/79 | HR 78 | Temp 98.0°F | Ht 63.0 in | Wt 227.0 lb

## 2019-02-05 DIAGNOSIS — Z6841 Body Mass Index (BMI) 40.0 and over, adult: Secondary | ICD-10-CM | POA: Diagnosis not present

## 2019-02-05 DIAGNOSIS — I10 Essential (primary) hypertension: Secondary | ICD-10-CM

## 2019-02-05 DIAGNOSIS — F3289 Other specified depressive episodes: Secondary | ICD-10-CM | POA: Diagnosis not present

## 2019-02-05 DIAGNOSIS — R7303 Prediabetes: Secondary | ICD-10-CM

## 2019-02-05 DIAGNOSIS — Z9189 Other specified personal risk factors, not elsewhere classified: Secondary | ICD-10-CM | POA: Diagnosis not present

## 2019-02-05 MED ORDER — METFORMIN HCL 500 MG PO TABS: 500.0000 mg | ORAL_TABLET | Freq: Every day | ORAL | 0 refills | Status: DC

## 2019-02-05 MED FILL — metFORMIN HCL 500 MG TABS: 500 | 30 days supply | Qty: 30 | Fill #0

## 2019-02-05 MED FILL — LISINOPRIL-HCTZ 20-12.5 MG: 20-12.5 | 90 days supply | Qty: 90 | Fill #0

## 2019-02-05 NOTE — Progress Notes (Signed)
Office: [phone]  /  Fax: [phone]   HPI:   Chief Complaint: OBESITY Kayla Marshall is here to discuss her progress with her obesity treatment plan. She is on the Category 2 plan and is following her eating plan approximately 80 % of the time. She states she is exercising 0 minutes 0 times per week. Kayla Marshall returns for her 1st follow up. She didn't get to go on her trip because she couldn't get the rime off. She notes hunger first thing in the morning and at night when getting off work. She notes occasional hunger after dinner.  Her weight is 227 lb (103 kg) today and has had a weight loss of 8 pounds over a period of 3 weeks since her last visit. She has lost 8 lbs since starting treatment with [place].  Pre-Diabetes Kayla Marshall has a diagnosis of pre-diabetes based on her elevated Hgb A1c at 6.4 and insulin of 28.1. She was informed this puts her at greater risk of developing diabetes. She has no prior data of Hgb A1c. She is not taking metformin currently and she notes hunger. She continues to work on diet and exercise to decrease risk of diabetes. She denies nausea or hypoglycemia.  At risk for diabetes Kayla Marshall is at higher than average risk for developing diabetes due to her obesity and pre-diabetes. She currently denies polyuria or polydipsia.  Hypertension Kayla Marshall is a 68 y.o. female with hypertension. Zyaira's blood pressure is controlled today and she denies chest pain. She is on lisinopril and last CMP was within normal limits. She is working on weight loss to help control her blood pressure with the goal of decreasing her risk of heart attack and stroke.   ASSESSMENT AND PLAN:  Prediabetes - Plan: metFORMIN (GLUCOPHAGE) 500 MG tablet  Essential hypertension  At risk for diabetes mellitus  Class 3 severe obesity with serious comorbidity and body mass index (BMI) of 40.0 to 44.9 in adult, unspecified obesity type ([place])  PLAN:  Pre-Diabetes Kayla Marshall will continue to work on weight loss,  exercise, and decreasing simple carbohydrates in her diet to help decrease the risk of diabetes. We dicussed metformin including benefits and risks. She was informed that eating too many simple carbohydrates or too many calories at one sitting increases the likelihood of GI side effects. Macie agrees to start metformin 500 mg PO q daily #30 with no refills. Kayla Marshall agrees to follow up with our clinic in 2 weeks as directed to monitor her progress.  Diabetes risk counseling Kayla Marshall was given extended (30 minutes) diabetes prevention counseling today. She is 68 y.o. female and has risk factors for diabetes including obesity and pre-diabetes. We discussed intensive lifestyle modifications today with an emphasis on weight loss as well as increasing exercise and decreasing simple carbohydrates in her diet.  Hypertension We discussed sodium restriction, working on healthy weight loss, and a regular exercise program as the means to achieve improved blood pressure control. Kayla Marshall Gala agreed with this plan and agreed to follow up as directed. We will continue to monitor her blood pressure as well as her progress with the above lifestyle modifications. Kayla Marshall agrees to continue her current medications and will watch for signs of hypotension as she continues her lifestyle modifications. We will follow up on blood pressure at next visit, and we will repeat electrolytes in 3 months. Kayla Marshall agrees to follow up with our clinic in 2 weeks.  Obesity Kayla Marshall is currently in the action stage of change. As such, her goal is  to continue with weight loss efforts She has agreed to follow the Category 2 plan Kayla Marshall has been instructed to work up to a goal of 150 minutes of combined cardio and strengthening exercise per week for weight loss and overall health benefits. We discussed the following Behavioral Modification Strategies today: increasing lean protein intake, increasing vegetables and work on meal planning and easy cooking plans,  and planning for success   Kayla Marshall has agreed to follow up with our clinic in 2 weeks. She was informed of the importance of frequent follow up visits to maximize her success with intensive lifestyle modifications for her multiple health conditions.  ALLERGIES: Allergies  Allergen Reactions  . Erythromycin     "Rectal bleeding."  . Avelox [Moxifloxacin Hcl In Nacl]   . Meloxicam     Dizziness.   . Moxifloxacin     Other reaction(s): presyncopal    MEDICATIONS: Current Outpatient Medications on File Prior to Visit  Medication Sig Dispense Refill  . ALPRAZolam (XANAX) 0.25 MG tablet Take 0.25 mg by mouth 2 (two) times daily as needed for anxiety or sleep.   0  . latanoprost (XALATAN) 0.005 % ophthalmic solution Place 0.005 drops into both eyes at bedtime.  4  . lisinopril (PRINIVIL,ZESTRIL) 20 MG tablet   1   No current facility-administered medications on file prior to visit.     PAST MEDICAL HISTORY: Past Medical History:  Diagnosis Date  . Allergies   . Anemia   . Anxiety   . Arthritis    back  . B12 deficiency   . Back pain   . Chewing difficulty   . Cold intolerance   . Depression   . Dry mouth   . Fatigue   . Gallbladder disease   . GERD (gastroesophageal reflux disease)   . Glaucoma   . Hay fever   . Headache   . Hoarseness   . Hypertension   . Hypothyroidism   . Irritable bowel   . Joint pain   . Lactose intolerance   . Leg cramps   . Muscle stiffness   . Nervousness   . Obesity   . Palpitations   . PPH (postpartum hemorrhage)   . Prediabetes   . Sciatic pain   . Sciatica   . Shortness of breath dyspnea   . Sleep apnea   . Sleep apnea   . Stress   . Swelling of lower extremity   . Tinnitus   . Torn meniscus    left  . Vitamin D deficiency   . Weakness     PAST SURGICAL HISTORY: Past Surgical History:  Procedure Laterality Date  . CHOLECYSTECTOMY  1990  . DILATATION & CURETTAGE/HYSTEROSCOPY WITH MYOSURE N/A 11/12/2015   Procedure:  DILATATION & CURETTAGE/HYSTEROSCOPY WITH  MYOSURE;  Surgeon: Thurnell Lose, MD;  Location: [facility];  Service: Gynecology;  Laterality: N/A;    SOCIAL HISTORY: Social History   Tobacco Use  . Smoking status: Never Smoker  . Smokeless tobacco: Never Used  Substance Use Topics  . Alcohol use: Yes    Comment: occas  . Drug use: No    FAMILY HISTORY: Family History  Problem Relation Age of Onset  . COPD Mother   . Stroke Mother   . Hypertension Mother   . Anxiety disorder Mother   . COPD Father   . Heart disease Father   . Depression Father   . Cancer Sister     ROS: Review of Systems  Constitutional: Negative for  weight loss.  Cardiovascular: Negative for chest pain.  Gastrointestinal: Negative for nausea.  Genitourinary: Negative for frequency.  Endo/Heme/Allergies: Negative for polydipsia.       Negative hypoglycemia    PHYSICAL EXAM: Blood pressure 132/79, pulse 78, temperature 98 F (36.7 C), temperature source Oral, height 5\' 3"  (1.6 m), weight 227 lb (103 kg), SpO2 95 %. Body mass index is 40.21 kg/m. Physical Exam Vitals signs reviewed.  Constitutional:      Appearance: Normal appearance. She is obese.  Cardiovascular:     Rate and Rhythm: Normal rate.     Pulses: Normal pulses.  Pulmonary:     Effort: Pulmonary effort is normal.     Breath sounds: Normal breath sounds.  Musculoskeletal: Normal range of motion.  Skin:    General: Skin is warm and dry.  Neurological:     Mental Status: She is alert and oriented to person, place, and time.  Psychiatric:        Mood and Affect: Mood normal.        Behavior: Behavior normal.     RECENT LABS AND TESTS: BMET    Component Value Date/Time   NA 140 11/05/2015 1415   K 4.0 11/05/2015 1415   CL 104 11/05/2015 1415   CO2 25 11/05/2015 1415   GLUCOSE 120 (H) 11/05/2015 1415   BUN 17 11/05/2015 1415   CREATININE 0.68 11/05/2015 1415   CALCIUM 9.2 11/05/2015 1415   GFRNONAA >60 11/05/2015 1415   GFRAA  >60 11/05/2015 1415   No results found for: HGBA1C Lab Results  Component Value Date   INSULIN 28.1 (H) 01/16/2019   CBC    Component Value Date/Time   WBC 8.5 11/05/2015 1415   RBC 4.27 11/05/2015 1415   HGB 13.1 11/05/2015 1415   HCT 40.2 11/05/2015 1415   PLT 295 11/05/2015 1415   MCV 94.1 11/05/2015 1415   MCH 30.7 11/05/2015 1415   MCHC 32.6 11/05/2015 1415   RDW 14.5 11/05/2015 1415   LYMPHSABS 1.3 10/22/2015 0933   MONOABS 0.6 10/22/2015 0933   EOSABS 0.1 10/22/2015 0933   BASOSABS 0.0 10/22/2015 0933   Iron/TIBC/Ferritin/ %Sat No results found for: IRON, TIBC, FERRITIN, IRONPCTSAT Lipid Panel  No results found for: CHOL, TRIG, HDL, CHOLHDL, VLDL, LDLCALC, LDLDIRECT Hepatic Function Panel  No results found for: PROT, ALBUMIN, AST, ALT, ALKPHOS, BILITOT, BILIDIR, IBILI    Component Value Date/Time   TSH 1.225 08/21/2012 0254      OBESITY BEHAVIORAL INTERVENTION VISIT  Today's visit was # 2   Starting weight: 235 lbs Starting date: 01/16/2019 Today's weight: 227 lbs  Today's date: 02/05/2019 Total lbs lost to date: 8    02/05/2019  Height 5\' 3"  (1.6 m)  Weight 227 lb (103 kg)  BMI (Calculated) 40.22  BLOOD PRESSURE - SYSTOLIC 528  BLOOD PRESSURE - DIASTOLIC 79   Body Fat % 41.3 %  Total Body Water (lbs) 79.4 lbs     ASK: We discussed the diagnosis of obesity with Versie Starks today and Kayla Marshall Gala agreed to give [place] permission to discuss obesity behavioral modification therapy today.  ASSESS: Kayla Marshall has the diagnosis of obesity and her BMI today is 82.22 Naima is in the action stage of change   ADVISE: Kayla Marshall was educated on the multiple health risks of obesity as well as the benefit of weight loss to improve her health. She was advised of the need for long term treatment and the importance of lifestyle modifications to improve her  current health and to decrease her risk of future health problems.  AGREE: Multiple dietary modification options and  treatment options were discussed and  Kayla Marshall agreed to follow the recommendations documented in the above note.  ARRANGE: Female was educated on the importance of frequent visits to treat obesity as outlined per CMS and USPSTF guidelines and agreed to schedule her next follow up appointment today.  I, Trixie Dredge, am acting as transcriptionist for Ilene Qua, MD  I have reviewed the above documentation for accuracy and completeness, and I agree with the above. - Ilene Qua, MD

## 2019-02-06 ENCOUNTER — Encounter (INDEPENDENT_AMBULATORY_CARE_PROVIDER_SITE_OTHER): Payer: Self-pay

## 2019-02-07 ENCOUNTER — Ambulatory Visit: Payer: 59 | Admitting: Podiatry

## 2019-02-07 ENCOUNTER — Other Ambulatory Visit: Payer: Self-pay

## 2019-02-07 ENCOUNTER — Encounter: Payer: Self-pay | Admitting: Podiatry

## 2019-02-07 DIAGNOSIS — L84 Corns and callosities: Secondary | ICD-10-CM | POA: Diagnosis not present

## 2019-02-07 DIAGNOSIS — M779 Enthesopathy, unspecified: Secondary | ICD-10-CM

## 2019-02-07 DIAGNOSIS — M7752 Other enthesopathy of left foot: Secondary | ICD-10-CM | POA: Diagnosis not present

## 2019-02-07 DIAGNOSIS — M7751 Other enthesopathy of right foot: Secondary | ICD-10-CM | POA: Diagnosis not present

## 2019-02-07 DIAGNOSIS — M21622 Bunionette of left foot: Secondary | ICD-10-CM

## 2019-02-09 NOTE — Progress Notes (Signed)
Subjective:   Patient ID: Kayla Marshall, female   DOB: 68 y.o.   MRN: [id number]   HPI Patient presents stating she is still getting a lot of pain in the plantar aspect of her left foot and stated the medication was only effective for a short period of time   ROS      Objective:  Physical Exam  Neurovascular status found to be intact with patient found to have significant prominence at the head of the fifth metatarsal with inflammation around the joint surface and discoloration of the surface secondary to pressure     Assessment:  Inflammatory capsulitis with tailor's bunion deformity and prominent bone structure fifth metatarsal head left     Plan:  H&P reviewed condition and at this time we will get a try to make her a deep orthotic to take pressure off the area.  Reappoint for [place] to recheck again and today she is seen by ped orthotist who makes her an orthotic that will have a deep pocket for the fifth metatarsal left to try to take all stress off the joint surface

## 2019-02-10 DIAGNOSIS — J309 Allergic rhinitis, unspecified: Secondary | ICD-10-CM | POA: Diagnosis not present

## 2019-02-10 DIAGNOSIS — R5383 Other fatigue: Secondary | ICD-10-CM | POA: Diagnosis not present

## 2019-02-10 DIAGNOSIS — M47816 Spondylosis without myelopathy or radiculopathy, lumbar region: Secondary | ICD-10-CM | POA: Diagnosis not present

## 2019-02-10 MED FILL — CELECOXIB 200 MG CAP: 200 | 30 days supply | Qty: 30 | Fill #0

## 2019-02-10 MED FILL — FLUTICASONE PROP 50 MCG SPR: 50 | 30 days supply | Qty: 16 | Fill #0

## 2019-02-11 ENCOUNTER — Encounter (INDEPENDENT_AMBULATORY_CARE_PROVIDER_SITE_OTHER): Payer: Self-pay

## 2019-02-13 ENCOUNTER — Ambulatory Visit (INDEPENDENT_AMBULATORY_CARE_PROVIDER_SITE_OTHER): Payer: 59 | Admitting: Orthopaedic Surgery

## 2019-02-13 ENCOUNTER — Ambulatory Visit (INDEPENDENT_AMBULATORY_CARE_PROVIDER_SITE_OTHER): Payer: 59

## 2019-02-13 ENCOUNTER — Encounter (INDEPENDENT_AMBULATORY_CARE_PROVIDER_SITE_OTHER): Payer: Self-pay

## 2019-02-13 ENCOUNTER — Other Ambulatory Visit: Payer: Self-pay

## 2019-02-13 ENCOUNTER — Encounter (INDEPENDENT_AMBULATORY_CARE_PROVIDER_SITE_OTHER): Payer: Self-pay | Admitting: Orthopaedic Surgery

## 2019-02-13 DIAGNOSIS — M25551 Pain in right hip: Secondary | ICD-10-CM

## 2019-02-13 DIAGNOSIS — H02886 Meibomian gland dysfunction of left eye, unspecified eyelid: Secondary | ICD-10-CM | POA: Diagnosis not present

## 2019-02-13 DIAGNOSIS — M25552 Pain in left hip: Secondary | ICD-10-CM

## 2019-02-13 DIAGNOSIS — H02883 Meibomian gland dysfunction of right eye, unspecified eyelid: Secondary | ICD-10-CM | POA: Diagnosis not present

## 2019-02-13 DIAGNOSIS — H0102A Squamous blepharitis right eye, upper and lower eyelids: Secondary | ICD-10-CM | POA: Diagnosis not present

## 2019-02-13 DIAGNOSIS — H0102B Squamous blepharitis left eye, upper and lower eyelids: Secondary | ICD-10-CM | POA: Diagnosis not present

## 2019-02-13 DIAGNOSIS — H8111 Benign paroxysmal vertigo, right ear: Secondary | ICD-10-CM | POA: Diagnosis not present

## 2019-02-13 DIAGNOSIS — H409 Unspecified glaucoma: Secondary | ICD-10-CM | POA: Diagnosis not present

## 2019-02-13 MED ORDER — MELOXICAM 7.5 MG PO TABS: 7.5000 mg | ORAL_TABLET | Freq: Two times a day (BID) | ORAL | 1 refills | Status: DC | PRN

## 2019-02-13 NOTE — Progress Notes (Signed)
Office Visit Note   Patient: Kayla Marshall           Date of Birth: 1950-12-02           MRN: [id number] Visit Date: 02/13/2019              Requested by: Harlan Stains, MD [address], [place] [address] PCP: Harlan Stains, MD   Assessment & Plan: Visit Diagnoses:  1. Pain in right hip   2. Left hip pain     Plan: Impression is right hip trochanteric bursitis versus lumbar radiculopathy.  I discussed with the patient proceeding with trochanteric bursa cortisone injection as well as an iliotibial band stretching program, but she has vertigo and is unable to lie down to have the injection.  I have provided her with an iliotibial band stretching program.  I will also call in mobic which she has tolerated before and which she has been instructed to take an H2 blocker or proton pump inhibitor.  If her vertigo settles down over the next few weeks and she would still like to have the injection, she will call and let us know.  Otherwise, follow-up with [place] as needed.  Follow-Up Instructions: Return if symptoms worsen or fail to improve.   Orders:  Orders Placed This Encounter  Procedures  . XR HIP UNILAT W OR W/O PELVIS 2-3 VIEWS RIGHT  . XR HIP UNILAT W OR W/O PELVIS 2-3 VIEWS LEFT   Meds ordered this encounter  Medications  . meloxicam (MOBIC) 7.5 MG tablet    Sig: Take 1 tablet (7.5 mg total) by mouth 2 (two) times daily as needed for pain.    Dispense:  60 tablet    Refill:  1      Procedures: No procedures performed   Clinical Data: No additional findings.   Subjective: Chief Complaint  Patient presents with  . Lower Back - Pain    HPI patient is a pleasant 68 year old female who presents to our clinic today with continued right leg pain.  The pain she has to the right buttocks that goes down the back of her leg.  She also complains of a new pain to the lateral hip and radiating to the top of her thigh down into the knee.  No groin pain.  The  pain she has is worse with external rotation of the hip as well as going from a seated to standing position.  She has a known history of right-sided lumbar radiculopathy.  Last MRI was from 2016.  No history of hip pathology.  No previous trochanteric bursa injection.  No bowel or bladder change and no saddle paresthesias.  Review of Systems as detailed in HPI.  All others reviewed and are negative.   Objective: Vital Signs: There were no vitals taken for this visit.  Physical Exam well-developed well-nourished female no acute distress.  Alert and oriented x3.  Ortho Exam examination of her right hip reveals a negative logroll.  Negative Fater.  Positive straight leg raise.  Marked tenderness over the trochanteric bursa.  Moderate tenderness over the piriformis.  No focal weakness.  She is neurovascularly intact distally.  Specialty Comments:  No specialty comments available.  Imaging: Xr Hip Unilat W Or W/o Pelvis 2-3 Views Left  Result Date: 02/13/2019 Mild to moderate joint space narrowing   Xr Hip Unilat W Or W/o Pelvis 2-3 Views Right  Result Date: 02/13/2019 Mild to moderate joint space narrowing  PMFS History: Patient Active Problem List   Diagnosis Date Noted  . Left hip pain 02/13/2019  . Chronic pain of left knee 07/17/2018  . Morbid obesity ([facility]) 07/17/2018  . Body mass index 40.0-44.9, adult ([place]) 07/17/2018  . Lumbar spondylosis 04/24/2018  . Allergic rhinitis 04/24/2018  . Anxiety disorder 04/24/2018  . Digital mucous cyst of finger of left hand 10/08/2017  . Acute medial meniscus tear of left knee 07/30/2017  . OSA on CPAP 08/26/2015  . Essential (primary) hypertension 04/05/2015  . Palpitation 08/21/2012  . Unspecified hypothyroidism 08/21/2012   Past Medical History:  Diagnosis Date  . Allergies   . Anemia   . Anxiety   . Arthritis    back  . B12 deficiency   . Back pain   . Chewing difficulty   . Cold intolerance   . Depression   . Dry mouth    . Fatigue   . Gallbladder disease   . GERD (gastroesophageal reflux disease)   . Glaucoma   . Hay fever   . Headache   . Hoarseness   . Hypertension   . Hypothyroidism   . Irritable bowel   . Joint pain   . Lactose intolerance   . Leg cramps   . Muscle stiffness   . Nervousness   . Obesity   . Palpitations   . PPH (postpartum hemorrhage)   . Prediabetes   . Sciatic pain   . Sciatica   . Shortness of breath dyspnea   . Sleep apnea   . Sleep apnea   . Stress   . Swelling of lower extremity   . Tinnitus   . Torn meniscus    left  . Vitamin D deficiency   . Weakness     Family History  Problem Relation Age of Onset  . COPD Mother   . Stroke Mother   . Hypertension Mother   . Anxiety disorder Mother   . COPD Father   . Heart disease Father   . Depression Father   . Cancer Sister     Past Surgical History:  Procedure Laterality Date  . CHOLECYSTECTOMY  1990  . DILATATION & CURETTAGE/HYSTEROSCOPY WITH MYOSURE N/A 11/12/2015   Procedure: DILATATION & CURETTAGE/HYSTEROSCOPY WITH  MYOSURE;  Surgeon: Thurnell Lose, MD;  Location: [facility];  Service: Gynecology;  Laterality: N/A;   Social History   Occupational History  . Occupation: Presenter, broadcasting  Tobacco Use  . Smoking status: Never Smoker  . Smokeless tobacco: Never Used  Substance and Sexual Activity  . Alcohol use: Yes    Comment: occas  . Drug use: No  . Sexual activity: Yes    Birth control/protection: Post-menopausal

## 2019-02-17 ENCOUNTER — Telehealth (INDEPENDENT_AMBULATORY_CARE_PROVIDER_SITE_OTHER): Payer: Self-pay

## 2019-02-17 NOTE — Telephone Encounter (Signed)
Patient has Vertigo and will call back to R/s Appt.

## 2019-02-18 ENCOUNTER — Ambulatory Visit (INDEPENDENT_AMBULATORY_CARE_PROVIDER_SITE_OTHER): Payer: 59 | Admitting: Orthopaedic Surgery

## 2019-02-19 ENCOUNTER — Ambulatory Visit (INDEPENDENT_AMBULATORY_CARE_PROVIDER_SITE_OTHER): Payer: 59 | Admitting: Orthopaedic Surgery

## 2019-02-19 DIAGNOSIS — H00025 Hordeolum internum left lower eyelid: Secondary | ICD-10-CM | POA: Diagnosis not present

## 2019-02-19 DIAGNOSIS — H01003 Unspecified blepharitis right eye, unspecified eyelid: Secondary | ICD-10-CM | POA: Diagnosis not present

## 2019-02-25 ENCOUNTER — Telehealth: Payer: Self-pay | Admitting: Podiatry

## 2019-02-25 NOTE — Telephone Encounter (Signed)
cxled appt to [place].   Pt ok with mailing orthotics to her with instructions and aware if any issues with them to call to schedule an appt to see Liliane Channel

## 2019-02-26 ENCOUNTER — Encounter (INDEPENDENT_AMBULATORY_CARE_PROVIDER_SITE_OTHER): Payer: Self-pay | Admitting: Family Medicine

## 2019-02-26 ENCOUNTER — Other Ambulatory Visit: Payer: Self-pay

## 2019-02-26 ENCOUNTER — Ambulatory Visit (INDEPENDENT_AMBULATORY_CARE_PROVIDER_SITE_OTHER): Payer: 59 | Admitting: Family Medicine

## 2019-02-26 ENCOUNTER — Ambulatory Visit (INDEPENDENT_AMBULATORY_CARE_PROVIDER_SITE_OTHER): Payer: Self-pay | Admitting: Psychology

## 2019-02-26 DIAGNOSIS — R7303 Prediabetes: Secondary | ICD-10-CM

## 2019-02-26 DIAGNOSIS — Z6841 Body Mass Index (BMI) 40.0 and over, adult: Secondary | ICD-10-CM | POA: Diagnosis not present

## 2019-02-26 DIAGNOSIS — I10 Essential (primary) hypertension: Secondary | ICD-10-CM

## 2019-02-26 NOTE — Progress Notes (Signed)
Office: [phone]  /  Fax: [phone] TeleHealth Visit:  Kayla Marshall has verbally consented to this TeleHealth visit today. The patient is located at home, the provider is located at the [employer] [facility] office. The participants in this visit include the listed provider and patient and provider's assistant. The visit was conducted today via face time.  HPI:   Chief Complaint: OBESITY Kayla Marshall is here to discuss her progress with her obesity treatment plan. She is on the Category 2 plan and is following her eating plan approximately 80 % of the time. She states she is exercising 0 minutes 0 times per week. Kayla Marshall hurt her back a few weeks ago. She got prescribed meloxicam and had to take time off work on short term disability. She has had vertigo for the past 2 weeks and had stye. She believes she gained weight and thinks she overindulged on ginger snaps. She is often limiting potions size at dinner secondary to nausea at evening time. She doesn't have all the food for the plan at home.  We were unable to weigh the patient today for this TeleHealth visit. She feels as if she has gained weight since her last visit. She has lost 8 lbs since starting treatment with [place].  Pre-Diabetes Kayla Marshall has a diagnosis of pre-diabetes based on her elevated Hgb A1c and was informed this puts her at greater risk of developing diabetes. Her insulin was elevated at last lab draw. She denies GI side effects of metformin (she reports only intermittent flatulence). She continues to work on diet and exercise to decrease risk of diabetes. She denies hypoglycemia.  Hypertension Kayla Marshall is a 68 y.o. female with hypertension. [name]'s blood pressure was controlled at previous appointment. She denies chest pain, chest pressure, or headaches. She is working on weight loss to help control her blood pressure with the goal of decreasing her risk of heart attack and stroke.   ASSESSMENT AND PLAN:  Prediabetes   Essential hypertension  Class 3 severe obesity with serious comorbidity and body mass index (BMI) of 40.0 to 44.9 in adult, unspecified obesity type ([place])  PLAN:  Pre-Diabetes [name] will continue to work on weight loss, exercise, and decreasing simple carbohydrates in her diet to help decrease the risk of diabetes. We dicussed metformin including benefits and risks. She was informed that eating too many simple carbohydrates or too many calories at one sitting increases the likelihood of GI side effects. [name] agrees to continue taking metformin, and she agrees to follow up with our clinic in 2 weeks as directed to monitor her progress.  Hypertension We discussed sodium restriction, working on healthy weight loss, and a regular exercise program as the means to achieve improved blood pressure control. Kayla Marshall agreed with this plan and agreed to follow up as directed. We will continue to monitor her blood pressure as well as her progress with the above lifestyle modifications. [name] agrees to continue her current medications and will watch for signs of hypotension as she continues her lifestyle modifications. Kayla Marshall agrees to follow up with our clinic in 2 weeks.  Obesity Kayla Marshall is currently in the action stage of change. As such, her goal is to continue with weight loss efforts She has agreed to follow the Category 2 plan  Kayla Marshall is to work on getting in protein at dinner. Kayla Marshall has been instructed to work up to a goal of 150 minutes of combined cardio and strengthening exercise per week for weight loss and overall  health benefits. We discussed the following Behavioral Modification Strategies today: increasing lean protein intake, no skipping meals, ways to avoid boredom eating, better snacking choices, and planning for success   Kayla Marshall has agreed to follow up with our clinic in 2 weeks. She was informed of the importance of frequent follow up visits to maximize her success with intensive lifestyle  modifications for her multiple health conditions.  ALLERGIES: Allergies  Allergen Reactions  . Erythromycin     "Rectal bleeding."  . Avelox [Moxifloxacin Hcl In Nacl]   . Meloxicam     Dizziness.   . Moxifloxacin     Other reaction(s): presyncopal    MEDICATIONS: Current Outpatient Medications on File Prior to Visit  Medication Sig Dispense Refill  . ALPRAZolam (XANAX) 0.25 MG tablet Take 0.25 mg by mouth 2 (two) times daily as needed for anxiety or sleep.   0  . latanoprost (XALATAN) 0.005 % ophthalmic solution Place 0.005 drops into both eyes at bedtime.  4  . lisinopril (PRINIVIL,ZESTRIL) 20 MG tablet   1  . lisinopril-hydrochlorothiazide (PRINZIDE,ZESTORETIC) 20-12.5 MG tablet     . meloxicam (MOBIC) 7.5 MG tablet Take 1 tablet (7.5 mg total) by mouth 2 (two) times daily as needed for pain. 60 tablet 1  . metFORMIN (GLUCOPHAGE) 500 MG tablet Take 1 tablet (500 mg total) by mouth daily with breakfast. 30 tablet 0   No current facility-administered medications on file prior to visit.     PAST MEDICAL HISTORY: Past Medical History:  Diagnosis Date  . Allergies   . Anemia   . Anxiety   . Arthritis    back  . B12 deficiency   . Back pain   . Chewing difficulty   . Cold intolerance   . Depression   . Dry mouth   . Fatigue   . Gallbladder disease   . GERD (gastroesophageal reflux disease)   . Glaucoma   . Hay fever   . Headache   . Hoarseness   . Hypertension   . Hypothyroidism   . Irritable bowel   . Joint pain   . Lactose intolerance   . Leg cramps   . Muscle stiffness   . Nervousness   . Obesity   . Palpitations   . PPH (postpartum hemorrhage)   . Prediabetes   . Sciatic pain   . Sciatica   . Shortness of breath dyspnea   . Sleep apnea   . Sleep apnea   . Stress   . Swelling of lower extremity   . Tinnitus   . Torn meniscus    left  . Vitamin D deficiency   . Weakness     PAST SURGICAL HISTORY: Past Surgical History:  Procedure Laterality  Date  . CHOLECYSTECTOMY  1990  . DILATATION & CURETTAGE/HYSTEROSCOPY WITH MYOSURE N/A 11/12/2015   Procedure: DILATATION & CURETTAGE/HYSTEROSCOPY WITH  MYOSURE;  Surgeon: Thurnell Lose, MD;  Location: [facility];  Service: Gynecology;  Laterality: N/A;    SOCIAL HISTORY: Social History   Tobacco Use  . Smoking status: Never Smoker  . Smokeless tobacco: Never Used  Substance Use Topics  . Alcohol use: Yes    Comment: occas  . Drug use: No    FAMILY HISTORY: Family History  Problem Relation Age of Onset  . COPD Mother   . Stroke Mother   . Hypertension Mother   . Anxiety disorder Mother   . COPD Father   . Heart disease Father   . Depression Father   .  Cancer Sister     ROS: Review of Systems  Constitutional: Negative for weight loss.  Cardiovascular: Negative for chest pain.       Negative chest pressure  Gastrointestinal: Positive for nausea.  Neurological: Negative for headaches.       + Veritgo  Endo/Heme/Allergies:       Negative hypoglycemia    PHYSICAL EXAM: Pt in no acute distress  RECENT LABS AND TESTS: BMET    Component Value Date/Time   NA 140 11/05/2015 1415   K 4.0 11/05/2015 1415   CL 104 11/05/2015 1415   CO2 25 11/05/2015 1415   GLUCOSE 120 (H) 11/05/2015 1415   BUN 17 11/05/2015 1415   CREATININE 0.68 11/05/2015 1415   CALCIUM 9.2 11/05/2015 1415   GFRNONAA >60 11/05/2015 1415   GFRAA >60 11/05/2015 1415   No results found for: HGBA1C Lab Results  Component Value Date   INSULIN 28.1 (H) 01/16/2019   CBC    Component Value Date/Time   WBC 8.5 11/05/2015 1415   RBC 4.27 11/05/2015 1415   HGB 13.1 11/05/2015 1415   HCT 40.2 11/05/2015 1415   PLT 295 11/05/2015 1415   MCV 94.1 11/05/2015 1415   MCH 30.7 11/05/2015 1415   MCHC 32.6 11/05/2015 1415   RDW 14.5 11/05/2015 1415   LYMPHSABS 1.3 10/22/2015 0933   MONOABS 0.6 10/22/2015 0933   EOSABS 0.1 10/22/2015 0933   BASOSABS 0.0 10/22/2015 0933   Iron/TIBC/Ferritin/ %Sat No  results found for: IRON, TIBC, FERRITIN, IRONPCTSAT Lipid Panel  No results found for: CHOL, TRIG, HDL, CHOLHDL, VLDL, LDLCALC, LDLDIRECT Hepatic Function Panel  No results found for: PROT, ALBUMIN, AST, ALT, ALKPHOS, BILITOT, BILIDIR, IBILI    Component Value Date/Time   TSH 1.225 08/21/2012 0254      I, Trixie Dredge, am acting as transcriptionist for Ilene Qua, MD  I have reviewed the above documentation for accuracy and completeness, and I agree with the above. - Ilene Qua, MD

## 2019-02-27 ENCOUNTER — Telehealth (INDEPENDENT_AMBULATORY_CARE_PROVIDER_SITE_OTHER): Payer: Self-pay | Admitting: Psychology

## 2019-02-27 NOTE — Telephone Encounter (Signed)
  Office: [phone]  /  Fax: [phone]  Date of Call: February 27, 2019 Time of Call: 11:59am Duration of Call: 11 minutes Provider: Glennie Isle, PsyD  CONTENT: This provider called [name] to check-in and schedule an appointment, as the last appointment was canceled by [name]. She shared she is struggling with vertigo and sciatic pain resulting in short-term disability. Lorayne added she is "eating comfort food" as she does not like her job. Thus, this provider recommended scheduling a follow-up appointment. Initially, Autry was agreeable; however, she then declined a follow-up appointment and noted, "I have people I can talk to." A brief risk assessment was completed. Ashten denied experiencing suicidal and homicidal ideation, plan, and intent since the last appointment with this provider.   PLAN: This provider's clinic will call Sonita to check-in once in-person appointments resume.

## 2019-03-03 ENCOUNTER — Other Ambulatory Visit: Payer: 59 | Admitting: Orthotics

## 2019-03-04 DIAGNOSIS — H8111 Benign paroxysmal vertigo, right ear: Secondary | ICD-10-CM | POA: Diagnosis not present

## 2019-03-05 ENCOUNTER — Other Ambulatory Visit (INDEPENDENT_AMBULATORY_CARE_PROVIDER_SITE_OTHER): Payer: Self-pay

## 2019-03-05 ENCOUNTER — Telehealth (INDEPENDENT_AMBULATORY_CARE_PROVIDER_SITE_OTHER): Payer: Self-pay | Admitting: Family Medicine

## 2019-03-05 ENCOUNTER — Other Ambulatory Visit (INDEPENDENT_AMBULATORY_CARE_PROVIDER_SITE_OTHER): Payer: Self-pay | Admitting: Family Medicine

## 2019-03-05 DIAGNOSIS — R7303 Prediabetes: Secondary | ICD-10-CM

## 2019-03-05 MED ORDER — METFORMIN HCL 500 MG PO TABS: 500.0000 mg | ORAL_TABLET | Freq: Every day | ORAL | 0 refills | Status: DC

## 2019-03-05 NOTE — Telephone Encounter (Signed)
Error

## 2019-03-06 ENCOUNTER — Other Ambulatory Visit (INDEPENDENT_AMBULATORY_CARE_PROVIDER_SITE_OTHER): Payer: Self-pay | Admitting: Family Medicine

## 2019-03-06 DIAGNOSIS — H8113 Benign paroxysmal vertigo, bilateral: Secondary | ICD-10-CM | POA: Diagnosis not present

## 2019-03-06 DIAGNOSIS — J300 Vasomotor rhinitis: Secondary | ICD-10-CM | POA: Diagnosis not present

## 2019-03-06 DIAGNOSIS — R7303 Prediabetes: Secondary | ICD-10-CM

## 2019-03-06 MED FILL — TRIAMCINOLONE ACETONIDE 55: 55 | 30 days supply | Qty: 17 | Fill #0

## 2019-03-06 MED FILL — metFORMIN HCL 500 MG TABS: 500 | 30 days supply | Qty: 30 | Fill #0

## 2019-03-12 ENCOUNTER — Other Ambulatory Visit: Payer: Self-pay

## 2019-03-12 ENCOUNTER — Encounter (INDEPENDENT_AMBULATORY_CARE_PROVIDER_SITE_OTHER): Payer: Self-pay | Admitting: Family Medicine

## 2019-03-12 ENCOUNTER — Ambulatory Visit (INDEPENDENT_AMBULATORY_CARE_PROVIDER_SITE_OTHER): Payer: 59 | Admitting: Family Medicine

## 2019-03-12 DIAGNOSIS — I10 Essential (primary) hypertension: Secondary | ICD-10-CM

## 2019-03-12 DIAGNOSIS — R7303 Prediabetes: Secondary | ICD-10-CM

## 2019-03-12 DIAGNOSIS — Z6841 Body Mass Index (BMI) 40.0 and over, adult: Secondary | ICD-10-CM

## 2019-03-13 ENCOUNTER — Other Ambulatory Visit: Payer: Self-pay

## 2019-03-13 MED FILL — LISINOPRIL 20 MG TABLET: 20 | 90 days supply | Qty: 90 | Fill #0

## 2019-03-14 MED FILL — LATANOPROST 0.005% EYE DRP: 0.005 | 75 days supply | Qty: 8 | Fill #1

## 2019-03-17 NOTE — Progress Notes (Signed)
Office: [phone]  /  Fax: [phone] TeleHealth Visit:  Kayla Marshall has verbally consented to this TeleHealth visit today. The patient is located at home, the provider is located at the [employer] [facility] office. The participants in this visit include the listed provider and patient. The visit was conducted today via face time.  HPI:   Chief Complaint: OBESITY Kayla Marshall is here to discuss her progress with her obesity treatment plan. She is on the Category 2 plan and is following her eating plan approximately 75 % of the time. She states she is exercising 0 minutes 0 times per week. Kayla Marshall is still experiencing vertigo and she saw ENT last week and was given new prescriptions. 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 Dinners are the hardest because that's when she feels most nauseous. She is often not tolerating much for dinner, doing cottage cheese and brussell sprouts.  We were unable to weigh the patient today for this TeleHealth visit. She feels as if she has gained weight since her last visit. She has lost 8 lbs since starting treatment with [place].  Pre-Diabetes Kayla Marshall has a diagnosis of pre-diabetes based on her elevated Hgb A1c and was informed this puts her at greater risk of developing diabetes. She notes minimal carbohydrate cravings. She is taking metformin currently and denies GI side effects. She continues to work on diet and exercise to decrease risk of diabetes. She denies hypoglycemia.  Hypertension Kayla Marshall is a 68 y.o. female with hypertension. Kayla Marshall is on prinzide because of slight lower extremity edema. She blood pressure was controlled previously. She denies chest pain . She is working on weight loss to help control her blood pressure with the goal of decreasing her risk of heart attack and stroke.   ASSESSMENT AND PLAN:  Prediabetes  Essential hypertension  Class 3 severe obesity with serious comorbidity and body mass index (BMI) of 40.0 to 44.9 in  adult, unspecified obesity type ([place])  PLAN:  Pre-Diabetes Kayla Marshall will continue to work on weight loss, exercise, and decreasing simple carbohydrates in her diet to help decrease the risk of diabetes. We dicussed metformin including benefits and risks. She was informed that eating too many simple carbohydrates or too many calories at one sitting increases the likelihood of GI side effects. Kayla Marshall agrees to continue taking metformin, and agrees to follow up with our clinic in as needed to monitor her progress.  Hypertension We discussed sodium restriction, working on healthy weight loss, and a regular exercise program as the means to achieve improved blood pressure control. Kayla Marshall agreed with this plan and agreed to follow up as directed. We will continue to monitor her blood pressure as well as her progress with the above lifestyle modifications. Kayla Marshall agrees to continue her current medications and will watch for signs of hypotension as she continues her lifestyle modifications. We will need to follow up on labs in early June. Kayla Marshall agrees to follow up with our clinic as needed to monitor her progress.  Obesity Kayla Marshall is currently in the action stage of change. As such, her goal is to continue with weight loss efforts She has agreed to follow the Category 2 plan or follow the Pescatarian eating plan Kayla Marshall has been instructed to work up to a goal of 150 minutes of combined cardio and strengthening exercise per week for weight loss and overall health benefits. We discussed the following Behavioral Modification Strategies today: increasing lean protein intake, emotional eating strategies, better snacking  choices, and planning for success   Kayla Marshall has agreed to follow up with our clinic as needed. She was informed of the importance of frequent follow up visits to maximize her success with intensive lifestyle modifications for her multiple health conditions.  ALLERGIES: Allergies  Allergen Reactions  .  Erythromycin     "Rectal bleeding."  . Avelox [Moxifloxacin Hcl In Nacl]   . Meloxicam     Dizziness.   . Moxifloxacin     Other reaction(s): presyncopal    MEDICATIONS: Current Outpatient Medications on File Prior to Visit  Medication Sig Dispense Refill  . ALPRAZolam (XANAX) 0.25 MG tablet Take 0.25 mg by mouth 2 (two) times daily as needed for anxiety or sleep.   0  . latanoprost (XALATAN) 0.005 % ophthalmic solution Place 0.005 drops into both eyes at bedtime.  4  . lisinopril (PRINIVIL,ZESTRIL) 20 MG tablet   1  . lisinopril-hydrochlorothiazide (PRINZIDE,ZESTORETIC) 20-12.5 MG tablet     . meloxicam (MOBIC) 7.5 MG tablet Take 1 tablet (7.5 mg total) by mouth 2 (two) times daily as needed for pain. 60 tablet 1  . metFORMIN (GLUCOPHAGE) 500 MG tablet Take 1 tablet (500 mg total) by mouth daily with breakfast. 30 tablet 0   No current facility-administered medications on file prior to visit.     PAST MEDICAL HISTORY: Past Medical History:  Diagnosis Date  . Allergies   . Anemia   . Anxiety   . Arthritis    back  . B12 deficiency   . Back pain   . Chewing difficulty   . Cold intolerance   . Depression   . Dry mouth   . Fatigue   . Gallbladder disease   . GERD (gastroesophageal reflux disease)   . Glaucoma   . Hay fever   . Headache   . Hoarseness   . Hypertension   . Hypothyroidism   . Irritable bowel   . Joint pain   . Lactose intolerance   . Leg cramps   . Muscle stiffness   . Nervousness   . Obesity   . Palpitations   . PPH (postpartum hemorrhage)   . Prediabetes   . Sciatic pain   . Sciatica   . Shortness of breath dyspnea   . Sleep apnea   . Sleep apnea   . Stress   . Swelling of lower extremity   . Tinnitus   . Torn meniscus    left  . Vitamin D deficiency   . Weakness     PAST SURGICAL HISTORY: Past Surgical History:  Procedure Laterality Date  . CHOLECYSTECTOMY  1990  . DILATATION & CURETTAGE/HYSTEROSCOPY WITH MYOSURE N/A 11/12/2015    Procedure: DILATATION & CURETTAGE/HYSTEROSCOPY WITH  MYOSURE;  Surgeon: Thurnell Lose, MD;  Location: [facility];  Service: Gynecology;  Laterality: N/A;    SOCIAL HISTORY: Social History   Tobacco Use  . Smoking status: Never Smoker  . Smokeless tobacco: Never Used  Substance Use Topics  . Alcohol use: Yes    Comment: occas  . Drug use: No    FAMILY HISTORY: Family History  Problem Relation Age of Onset  . COPD Mother   . Stroke Mother   . Hypertension Mother   . Anxiety disorder Mother   . COPD Father   . Heart disease Father   . Depression Father   . Cancer Sister     ROS: Review of Systems  Constitutional: Negative for weight loss.  Cardiovascular: Negative for chest pain.  Neurological:       + Vertigo  Endo/Heme/Allergies:       Negative hypoglycemia    PHYSICAL EXAM: Pt in no acute distress  RECENT LABS AND TESTS: BMET    Component Value Date/Time   NA 140 11/05/2015 1415   K 4.0 11/05/2015 1415   CL 104 11/05/2015 1415   CO2 25 11/05/2015 1415   GLUCOSE 120 (H) 11/05/2015 1415   BUN 17 11/05/2015 1415   CREATININE 0.68 11/05/2015 1415   CALCIUM 9.2 11/05/2015 1415   GFRNONAA >60 11/05/2015 1415   GFRAA >60 11/05/2015 1415   No results found for: HGBA1C Lab Results  Component Value Date   INSULIN 28.1 (H) 01/16/2019   CBC    Component Value Date/Time   WBC 8.5 11/05/2015 1415   RBC 4.27 11/05/2015 1415   HGB 13.1 11/05/2015 1415   HCT 40.2 11/05/2015 1415   PLT 295 11/05/2015 1415   MCV 94.1 11/05/2015 1415   MCH 30.7 11/05/2015 1415   MCHC 32.6 11/05/2015 1415   RDW 14.5 11/05/2015 1415   LYMPHSABS 1.3 10/22/2015 0933   MONOABS 0.6 10/22/2015 0933   EOSABS 0.1 10/22/2015 0933   BASOSABS 0.0 10/22/2015 0933   Iron/TIBC/Ferritin/ %Sat No results found for: IRON, TIBC, FERRITIN, IRONPCTSAT Lipid Panel  No results found for: CHOL, TRIG, HDL, CHOLHDL, VLDL, LDLCALC, LDLDIRECT Hepatic Function Panel  No results found for: PROT,  ALBUMIN, AST, ALT, ALKPHOS, BILITOT, BILIDIR, IBILI    Component Value Date/Time   TSH 1.225 08/21/2012 0254      I, Trixie Dredge, am acting as transcriptionist for Ilene Qua, MD  I have reviewed the above documentation for accuracy and completeness, and I agree with the above. - Ilene Qua, MD

## 2019-03-20 DIAGNOSIS — H8110 Benign paroxysmal vertigo, unspecified ear: Secondary | ICD-10-CM | POA: Diagnosis not present

## 2019-04-02 ENCOUNTER — Ambulatory Visit: Payer: 59 | Attending: Family Medicine | Admitting: Physical Therapy

## 2019-04-02 ENCOUNTER — Other Ambulatory Visit: Payer: Self-pay

## 2019-04-02 ENCOUNTER — Encounter: Payer: Self-pay | Admitting: Physical Therapy

## 2019-04-02 DIAGNOSIS — R42 Dizziness and giddiness: Secondary | ICD-10-CM | POA: Diagnosis not present

## 2019-04-02 DIAGNOSIS — H8111 Benign paroxysmal vertigo, right ear: Secondary | ICD-10-CM | POA: Insufficient documentation

## 2019-04-02 DIAGNOSIS — R2681 Unsteadiness on feet: Secondary | ICD-10-CM | POA: Insufficient documentation

## 2019-04-03 DIAGNOSIS — H8110 Benign paroxysmal vertigo, unspecified ear: Secondary | ICD-10-CM | POA: Diagnosis not present

## 2019-04-03 MED FILL — ONDANSETRON ODT 4 MG TABLET: 4 | 4 days supply | Qty: 10 | Fill #0

## 2019-04-03 NOTE — Therapy (Signed)
[facility] [address] [place], [place], [address] Phone: [phone]   Fax:  [phone]  Physical Therapy Evaluation  Patient Details  Name: Kayla Marshall MRN: [id number] Date of Birth: 11/29/1950 Referring Provider (PT): Harlan Stains, MD   Encounter Date: 04/02/2019   CLINIC OPERATION CHANGES: [facility] is operating at a low capacity due to COVID-19.  The patient was brought into the clinic for evaluation and/or treatment following universal masking by staff, social distancing, and <10 people in the clinic.  The patient's COVID risk of complications score is 3.   PT End of Session - 04/03/19 1206    Visit Number  1    Number of Visits  13    Date for PT Re-Evaluation  05/18/19    Authorization Type  [employer] [employer], $20 copay.  VL: MN    PT Start Time  1150    PT Stop Time  1235    PT Time Calculation (min)  45 min    Activity Tolerance  Other (comment)   anxiety and emotional distress   Behavior During Therapy  Anxious       Past Medical History:  Diagnosis Date  . Allergies   . Anemia   . Anxiety   . Arthritis    back  . B12 deficiency   . Back pain   . Chewing difficulty   . Cold intolerance   . Depression   . Dry mouth   . Fatigue   . Gallbladder disease   . GERD (gastroesophageal reflux disease)   . Glaucoma   . Hay fever   . Headache   . Hoarseness   . Hypertension   . Hypothyroidism   . Irritable bowel   . Joint pain   . Lactose intolerance   . Leg cramps   . Muscle stiffness   . Nervousness   . Obesity   . Palpitations   . PPH (postpartum hemorrhage)   . Prediabetes   . Sciatic pain   . Sciatica   . Shortness of breath dyspnea   . Sleep apnea   . Sleep apnea   . Stress   . Swelling of lower extremity   . Tinnitus   . Torn meniscus    left  . Vitamin D deficiency   . Weakness     Past Surgical History:  Procedure Laterality Date  .  CHOLECYSTECTOMY  1990  . DILATATION & CURETTAGE/HYSTEROSCOPY WITH MYOSURE N/A 11/12/2015   Procedure: DILATATION & CURETTAGE/HYSTEROSCOPY WITH  MYOSURE;  Surgeon: Thurnell Lose, MD;  Location: [facility];  Service: Gynecology;  Laterality: N/A;    There were no vitals filed for this visit.   Subjective Assessment - 04/02/19 1157    Subjective  "You're not going to make me do the Epley are you?"  Pt has first episode of vertigo years ago when exposed to mold.  Pt treated with Flonase and chiropractor.  Current episode began in March 25th - woke up and rolled to the R and began to experience vertigo.  Episodes happen intermittently when rolling.  When upright and ambulating pt does not experience vertigo but does have a sense of disequilibrium.  No falls but pt is very cautious and moves slowly, uses spotting for stability.  Pt reports nausea but no vomiting but is very fatigued.  PCP and ENT attempted Epley but pt unable to tolerate.      Pertinent History  Vit D deficiency, tinnitus, hearing loss,  OSA with CPAP, sciatica, lumbar spondylosis, prediabetes, obesity, hypothyroidism, HTN, depression, OA, anxiety    Patient Stated Goals  To be able to do back exercises; to be able to walk more.  To be 100% before going back to work; 3 days dizziness free before returning to work    Currently in Pain?  No/denies         [facility] PT Assessment - 04/02/19 1208      Assessment   Medical Diagnosis  Vertigo, BPPV    Referring Provider (PT)  Harlan Stains, MD    Onset Date/Surgical Date  03/25/19      Precautions   Precautions  Other (comment)    Precaution Comments  Vit D deficiency, tinnitus, hearing loss, OSA with CPAP, sciatica, lumbar spondylosis, prediabetes, obesity, hypothyroidism, HTN, depression, OA, anxiety      Balance Screen   Has the patient fallen in the past 6 months  No    Has the patient had a decrease in activity level because of a fear of falling?   Yes    Is the patient reluctant to  leave their home because of a fear of falling?   Yes      Prior Function   Level of Independence  Independent    Vocation Requirements  works at [employer] but is currently on short term disability      Observation/Other Assessments   Focus on Therapeutic Outcomes (FOTO)   unable to assess      Sensation   Light Touch  Impaired by gross assessment    Additional Comments  h/o sciatica      ROM / Strength   AROM / PROM / Strength  Strength      Strength   Overall Strength  Within functional limits for tasks performed           Vestibular Assessment - 04/02/19 1212      Vestibular Assessment   General Observation  anxious/nervous      Symptom Behavior   Subjective history of current problem  in March, work up with vertigo when rolling to R    Type of Dizziness   Vertigo    Frequency of Dizziness  intermittent    Duration of Dizziness  pt feels like it lasts 5 minutes    Symptom Nature  Motion provoked;Positional    Aggravating Factors  Lying supine;Rolling to right    Relieving Factors  Head stationary;Slow movements    Progression of Symptoms  Better    History of similar episodes  a previous episode years ago that pt treated with Flonase and chiropractor      Oculomotor Exam   Oculomotor Alignment  Normal   mild R ptosis; premorbid   Ocular ROM  WFL    Spontaneous  Absent    Gaze-induced   Right beating nystagmus with R gaze    Smooth Pursuits  Intact    Saccades  Intact    Comment  Convergence intact      Oculomotor Exam-Fixation Suppressed    Left Head Impulse  negative    Right Head Impulse  negative      Vestibulo-Ocular Reflex   VOR to Slow Head Movement  Normal    VOR Cancellation  Normal      Positional Testing   Dix-Hallpike  Dix-Hallpike Right;Dix-Hallpike Left    Horizontal Canal Testing  Horizontal Canal Right;Horizontal Canal Left      Dix-Hallpike Right   Dix-Hallpike Right Duration  10 seconds  Dix-Hallpike Right Symptoms  Upbeat,  right rotatory nystagmus      Dix-Hallpike Left   Dix-Hallpike Left Duration  0    Dix-Hallpike Left Symptoms  No nystagmus      Horizontal Canal Right   Horizontal Canal Right Duration  0    Horizontal Canal Right Symptoms  Normal      Horizontal Canal Left   Horizontal Canal Left Duration  0    Horizontal Canal Left Symptoms  Normal          Objective measurements completed on examination: See above findings.       Vestibular Treatment/Exercise - 04/02/19 1302      Vestibular Treatment/Exercise   Vestibular Treatment Provided  Canalith Repositioning    Canalith Repositioning  Comment   not able to tolerate today     OTHER   Comment  unable to complete CRM due to emotional distress            PT Education - 04/03/19 1205    Education Details  purpose of vestibular rehab, BPPV and purpose of CRM, clinical findings, PT POC and goals, discuss with physician using anti-nausea or anti-anxiety medication prior to next session    Person(s) Educated  Patient    Methods  Explanation    Comprehension  Verbalized understanding          PT Long Term Goals - 04/03/19 1214      PT LONG TERM GOAL #1   Title  Pt will demonstrate independence with vestibular and balance HEP    Time  6    Period  Weeks    Status  New    Target Date  05/18/19      PT LONG TERM GOAL #2   Title  Pt will demonstrate ability to perform bed mobility without symptoms of vertigo    Time  6    Period  Weeks    Status  New    Target Date  05/18/19      PT LONG TERM GOAL #3   Title  Pt will demonstrate negative positional testing for BPPV    Time  6    Period  Weeks    Status  New    Target Date  05/18/19      PT LONG TERM GOAL #4   Title  Pt will report full return to performing supine back stability exercises    Time  6    Period  Weeks    Status  New    Target Date  05/18/19      PT LONG TERM GOAL #5   Title  Gait velocity goal TBD    Time  6    Period  Weeks    Status   New    Target Date  05/18/19             Plan - 04/03/19 1207    Clinical Impression Statement  Pt is a 68 year old female referred to Neuro OPPT for evaluation of vertigo.  Pt's PMH is significant for the following: Vit D deficiency, tinnitus, hearing loss, OSA with CPAP, sciatica, lumbar spondylosis, prediabetes, obesity, hypothyroidism, HTN, depression, OA, anxiety. The following deficits were noted during pt's exam: vertigo and up beating, R rotary nystagmus of short duration indicating R posterior canal canalithiasis, impaired balance and disequilibrium.  Pt had very strong emotional response and increased anxiety during [facility] testing, therefore, treatment of BPPV was held until next session.  Pt would benefit  from skilled PT to address these impairments and functional limitations to maximize functional mobility independence, to return to work, to be able to return to her supine back stability exercises and reduce falls risk.    Personal Factors and Comorbidities  Comorbidity 3+;Profession;Fitness    Comorbidities  Vit D deficiency, tinnitus, hearing loss, OSA with CPAP, sciatica, lumbar spondylosis, prediabetes, obesity, hypothyroidism, HTN, depression, OA, anxiety    Examination-Activity Limitations  Bed Mobility;Bend;Locomotion Level;Reach Overhead    Examination-Participation Restrictions  Other   work and back exercises   Stability/Clinical Decision Making  Stable/Uncomplicated    Clinical Decision Making  Low    Rehab Potential  Good    PT Frequency  2x / week    PT Duration  6 weeks    PT Treatment/Interventions  ADLs/Self Care Home Management;Canalith Repostioning;Functional mobility training;Therapeutic activities;Therapeutic exercise;Balance training;Neuromuscular re-education;Patient/family education;Vestibular    PT Next Visit Plan  treat BPPV, assess gait velocity.  HEP: x1 viewing, corner balance    Consulted and Agree with Plan of Care  Patient       Patient  will benefit from skilled therapeutic intervention in order to improve the following deficits and impairments:  Decreased balance, Decreased mobility, Dizziness, Difficulty walking  Visit Diagnosis: BPPV (benign paroxysmal positional vertigo), right  Dizziness and giddiness  Unsteadiness on feet     Problem List Patient Active Problem List   Diagnosis Date Noted  . Left hip pain 02/13/2019  . Chronic pain of left knee 07/17/2018  . Morbid obesity ([place]) 07/17/2018  . Body mass index 40.0-44.9, adult ([place]) 07/17/2018  . Lumbar spondylosis 04/24/2018  . Allergic rhinitis 04/24/2018  . Anxiety disorder 04/24/2018  . Digital mucous cyst of finger of left hand 10/08/2017  . Acute medial meniscus tear of left knee 07/30/2017  . OSA on CPAP 08/26/2015  . Essential (primary) hypertension 04/05/2015  . Palpitation 08/21/2012  . Unspecified hypothyroidism 08/21/2012    Rico Junker, PT, DPT 04/03/19    12:17 PM    [facility] [address] [place], [place], [address] Phone: [phone]   Fax:  [phone]  Name: Kayla Marshall MRN: [id number] Date of Birth: [birth year]

## 2019-04-04 ENCOUNTER — Ambulatory Visit: Payer: 59 | Admitting: Physical Therapy

## 2019-04-04 ENCOUNTER — Other Ambulatory Visit: Payer: Self-pay

## 2019-04-04 ENCOUNTER — Encounter: Payer: Self-pay | Admitting: Physical Therapy

## 2019-04-04 DIAGNOSIS — R42 Dizziness and giddiness: Secondary | ICD-10-CM

## 2019-04-04 DIAGNOSIS — H8111 Benign paroxysmal vertigo, right ear: Secondary | ICD-10-CM | POA: Diagnosis not present

## 2019-04-04 DIAGNOSIS — R2681 Unsteadiness on feet: Secondary | ICD-10-CM

## 2019-04-04 MED FILL — LATANOPROST 0.005% EYE DRP: 0.005 | 67 days supply | Qty: 8 | Fill #2

## 2019-04-04 NOTE — Therapy (Signed)
[facility] [address] [place], [place], [address] Phone: [phone]   Fax:  [phone]  Physical Therapy Treatment  Patient Details  Name: Kayla Marshall MRN: [id number] Date of Birth: [birth year] Referring Provider (PT): Harlan Stains, MD   Encounter Date: 04/04/2019   CLINIC OPERATION CHANGES: [facility] is operating at a low capacity due to COVID-19.  The patient was brought into the clinic for evaluation and/or treatment following universal masking by staff, social distancing, and <10 people in the clinic.  The patient's COVID risk of complications score is 3.   PT End of Session - 04/04/19 1620    Visit Number  2    Number of Visits  13    Date for PT Re-Evaluation  05/18/19    Authorization Type  [place] [employer], $20 copay.  VL: MN    PT Start Time  1400    PT Stop Time  1445    PT Time Calculation (min)  45 min    Activity Tolerance  Patient tolerated treatment well    Behavior During Therapy  WFL for tasks assessed/performed       Past Medical History:  Diagnosis Date  . Allergies   . Anemia   . Anxiety   . Arthritis    back  . B12 deficiency   . Back pain   . Chewing difficulty   . Cold intolerance   . Depression   . Dry mouth   . Fatigue   . Gallbladder disease   . GERD (gastroesophageal reflux disease)   . Glaucoma   . Hay fever   . Headache   . Hoarseness   . Hypertension   . Hypothyroidism   . Irritable bowel   . Joint pain   . Lactose intolerance   . Leg cramps   . Muscle stiffness   . Nervousness   . Obesity   . Palpitations   . PPH (postpartum hemorrhage)   . Prediabetes   . Sciatic pain   . Sciatica   . Shortness of breath dyspnea   . Sleep apnea   . Sleep apnea   . Stress   . Swelling of lower extremity   . Tinnitus   . Torn meniscus    left  . Vitamin D deficiency   . Weakness     Past Surgical History:  Procedure Laterality Date   . CHOLECYSTECTOMY  1990  . DILATATION & CURETTAGE/HYSTEROSCOPY WITH MYOSURE N/A 11/12/2015   Procedure: DILATATION & CURETTAGE/HYSTEROSCOPY WITH  MYOSURE;  Surgeon: Thurnell Lose, MD;  Location: [facility];  Service: Gynecology;  Laterality: N/A;    There were no vitals filed for this visit.  Subjective Assessment - 04/04/19 1406    Subjective  Went to see Dr. Dema Severin who ordered anti-nausea medication for her but she hasn't picked up yet.  Did take xanax today to help with anxiety.      Pertinent History  Vit D deficiency, tinnitus, hearing loss, OSA with CPAP, sciatica, lumbar spondylosis, prediabetes, obesity, hypothyroidism, HTN, depression, OA, anxiety    Patient Stated Goals  To be able to do back exercises; to be able to walk more.  To be 100% before going back to work; 3 days dizziness free before returning to work             Vestibular Assessment - 04/04/19 1409      Vestibular Assessment   General Observation  having more allergies today, eyelid is very  swollen on R side.  Mild dizziness today      Positional Testing   Dix-Hallpike  Dix-Hallpike Right      Dix-Hallpike Right   Dix-Hallpike Right Duration  5 seconds    Dix-Hallpike Right Symptoms  Upbeat, right rotatory nystagmus                Vestibular Treatment/Exercise - 04/04/19 1419      Vestibular Treatment/Exercise   Vestibular Treatment Provided  Canalith Repositioning;Gaze    Canalith Repositioning  Epley Manuever Right    Gaze Exercises  X1 Viewing Horizontal;X1 Viewing Vertical       EPLEY MANUEVER RIGHT   Number of Reps   1    Overall Response  Symptoms Resolved    Response Details   pt less anxious today and able to complete full treatment today      X1 Viewing Horizontal   Foot Position  seated    Reps  2    Comments  30 seconds      X1 Viewing Vertical   Foot Position  seated    Reps  2    Comments  30 seconds            PT Education - 04/04/19 1619    Education Details   discussed ways to elevate HOB if pt is sensitive to lying flat (elevate HOB, wedge under mattress); x1 viewing in sitting    Person(s) Educated  Patient    Methods  Explanation;Demonstration;Handout    Comprehension  Verbalized understanding;Returned demonstration          PT Long Term Goals - 04/03/19 1214      PT LONG TERM GOAL #1   Title  Pt will demonstrate independence with vestibular and balance HEP    Time  6    Period  Weeks    Status  New    Target Date  05/18/19      PT LONG TERM GOAL #2   Title  Pt will demonstrate ability to perform bed mobility without symptoms of vertigo    Time  6    Period  Weeks    Status  New    Target Date  05/18/19      PT LONG TERM GOAL #3   Title  Pt will demonstrate negative positional testing for BPPV    Time  6    Period  Weeks    Status  New    Target Date  05/18/19      PT LONG TERM GOAL #4   Title  Pt will report full return to performing supine back stability exercises    Time  6    Period  Weeks    Status  New    Target Date  05/18/19      PT LONG TERM GOAL #5   Title  Gait velocity goal TBD    Time  6    Period  Weeks    Status  New    Target Date  05/18/19            Plan - 04/04/19 1621    Clinical Impression Statement  Pt demonstrated significantly improved tolerance to testing for posterior canal BPPV; pt able to tolerate CRM which resolved pt's vertigo and nystagmus after one treatment.  Pt continues to demonstrate some sensitivity to lying supine and head turns.  Provided pt with x1 viewing in sitting.  Pt tolerated well without any nausea or emotional distress.  Will continue  to address and progress towards LTG.    Personal Factors and Comorbidities  Comorbidity 3+;Profession;Fitness    Comorbidities  Vit D deficiency, tinnitus, hearing loss, OSA with CPAP, sciatica, lumbar spondylosis, prediabetes, obesity, hypothyroidism, HTN, depression, OA, anxiety    Examination-Activity Limitations  Bed  Mobility;Bend;Locomotion Level;Reach Overhead    Examination-Participation Restrictions  Other   work and back exercises   Stability/Clinical Decision Making  Stable/Uncomplicated    Rehab Potential  Good    PT Frequency  2x / week    PT Duration  6 weeks    PT Treatment/Interventions  ADLs/Self Care Home Management;Canalith Repostioning;Functional mobility training;Therapeutic activities;Therapeutic exercise;Balance training;Neuromuscular re-education;Patient/family education;Vestibular    PT Next Visit Plan  treat BPPV as indicated; progress HEP: x1 viewing, corner balance    Consulted and Agree with Plan of Care  Patient       Patient will benefit from skilled therapeutic intervention in order to improve the following deficits and impairments:  Decreased balance, Decreased mobility, Dizziness, Difficulty walking  Visit Diagnosis: BPPV (benign paroxysmal positional vertigo), right  Dizziness and giddiness  Unsteadiness on feet     Problem List Patient Active Problem List   Diagnosis Date Noted  . Left hip pain 02/13/2019  . Chronic pain of left knee 07/17/2018  . Morbid obesity ([place]) 07/17/2018  . Body mass index 40.0-44.9, adult ([place]) 07/17/2018  . Lumbar spondylosis 04/24/2018  . Allergic rhinitis 04/24/2018  . Anxiety disorder 04/24/2018  . Digital mucous cyst of finger of left hand 10/08/2017  . Acute medial meniscus tear of left knee 07/30/2017  . OSA on CPAP 08/26/2015  . Essential (primary) hypertension 04/05/2015  . Palpitation 08/21/2012  . Unspecified hypothyroidism 08/21/2012   Rico Junker, PT, DPT 04/04/19    4:24 PM    [facility] [address] [place], [place], [address] Phone: [phone]   Fax:  [phone]  Name: Kayla Marshall MRN: [id number] Date of Birth: 1951-07-03

## 2019-04-04 NOTE — Patient Instructions (Signed)
Gaze Stabilization: Sitting    Keeping eyes on target on wall 3 feet away, and move head side to side for _30__ seconds. Repeat while moving head up and down for __30_ seconds. Do __2-3__ sessions per day.  Copyright  VHI. All rights reserved.   Gaze Stabilization: Tip Card  1.Target must remain in focus, not blurry, and appear stationary while head is in motion. 2.Perform exercises with small head movements (45 to either side of midline). 3.Increase speed of head motion so long as target is in focus. 4.If you wear eyeglasses, be sure you can see target through lens (therapist will give specific instructions for bifocal / progressive lenses). 5.These exercises may provoke dizziness or nausea. Work through these symptoms. If too dizzy, slow head movement slightly. Rest between each exercise. 6.Exercises demand concentration; avoid distractions.  Copyright  VHI. All rights reserved.     

## 2019-04-11 ENCOUNTER — Other Ambulatory Visit: Payer: Self-pay

## 2019-04-11 ENCOUNTER — Ambulatory Visit: Payer: 59 | Admitting: Physical Therapy

## 2019-04-11 DIAGNOSIS — R42 Dizziness and giddiness: Secondary | ICD-10-CM | POA: Diagnosis not present

## 2019-04-11 DIAGNOSIS — H8111 Benign paroxysmal vertigo, right ear: Secondary | ICD-10-CM

## 2019-04-11 DIAGNOSIS — R2681 Unsteadiness on feet: Secondary | ICD-10-CM

## 2019-04-11 NOTE — Patient Instructions (Addendum)
Feet Together (Compliant Surface) Varied Arm Positions - Eyes Open and Eyes Closed    With eyes open, standing on compliant surface: _FLOOR______, feet together and arms out, look at a stationary object. Hold _20___ seconds. Repeat __1__ times per session. Do _1-2___ sessions per day.  DO THIS EXERCISE STANDING ON FLOOR - NOT PILLOWS (Disregard different arm positions)   Feet Apart (Compliant Surface) Head Motion - Eyes Closed AND EYES OPEN    Stand on compliant surface: _PILLOWs_______ with feet shoulder width apart. Close eyes and move head slowly, up and down. Repeat __1__ times per session. Do _1-2___ sessions per day.  Eyes open _ targets SIDE TO SIDE AND UP/DOWN - 5-8 TIMES EACH DIRECTION

## 2019-04-11 NOTE — Therapy (Signed)
[facility] [address] [place], [place], [address] Phone: [phone]   Fax:  [phone]  Physical Therapy Treatment  Patient Details  Name: Kayla Marshall MRN: [id number] Date of Birth: [birth year] Referring Provider (PT): Harlan Stains, MD   CLINIC OPERATION CHANGES: [facility] is operating at a low capacity due to COVID-19.  The patient was brought into the clinic for evaluation and/or treatment  following universal masking by staff, social distancing, and <10 people in the clinic.  The patient's COVID risk of complications score is 3.  Encounter Date: 04/11/2019  PT End of Session - 04/11/19 2130    Visit Number  3    Number of Visits  13    Date for PT Re-Evaluation  05/18/19    Authorization Type  [facility] [place] [employer], $20 copay.  VL: [place]    PT Start Time  1455   pt arrived 44" late   PT Stop Time  1536    PT Time Calculation (min)  41 min    Activity Tolerance  Patient tolerated treatment well    Behavior During Therapy  WFL for tasks assessed/performed       Past Medical History:  Diagnosis Date  . Allergies   . Anemia   . Anxiety   . Arthritis    back  . B12 deficiency   . Back pain   . Chewing difficulty   . Cold intolerance   . Depression   . Dry mouth   . Fatigue   . Gallbladder disease   . GERD (gastroesophageal reflux disease)   . Glaucoma   . Hay fever   . Headache   . Hoarseness   . Hypertension   . Hypothyroidism   . Irritable bowel   . Joint pain   . Lactose intolerance   . Leg cramps   . Muscle stiffness   . Nervousness   . Obesity   . Palpitations   . PPH (postpartum hemorrhage)   . Prediabetes   . Sciatic pain   . Sciatica   . Shortness of breath dyspnea   . Sleep apnea   . Sleep apnea   . Stress   . Swelling of lower extremity   . Tinnitus   . Torn meniscus    left  . Vitamin D deficiency   . Weakness     Past Surgical History:   Procedure Laterality Date  . CHOLECYSTECTOMY  1990  . DILATATION & CURETTAGE/HYSTEROSCOPY WITH MYOSURE N/A 11/12/2015   Procedure: DILATATION & CURETTAGE/HYSTEROSCOPY WITH  MYOSURE;  Surgeon: Thurnell Lose, MD;  Location: [facility];  Service: Gynecology;  Laterality: N/A;    There were no vitals filed for this visit.  Subjective Assessment - 04/11/19 2115    Subjective  Pt states she feels "stuffy" today -  feels more out of balance and not dizzy like she was last week - states she did not sleep well last night - uses C pap machine; states she felt more clear when it rained this week compared to today when it's not raining; pt states she wonders if the weather impacts her dizziness  Pertinent History  Vit D deficiency, tinnitus, hearing loss, OSA with CPAP, sciatica, lumbar spondylosis, prediabetes, obesity, hypothyroidism, HTN, depression, OA, anxiety    Patient Stated Goals  To be able to do back exercises; to be able to walk more.  To be 100% before going back to work; 3 days dizziness free before returning to work    Currently in Pain?  No/denies           NeuroRe-ed:  Rt Dix-Hallpike test negative with no nystagmus and no c/o vertigo reported in test position - Indicating resolution of Rt posterior canal BPPV             Vestibular Treatment/Exercise - 04/11/19 1517      Vestibular Treatment/Exercise   Gaze Exercises  X1 Viewing Horizontal;X1 Viewing Vertical      X1 Viewing Horizontal   Foot Position  bilateral stance - in standing    Comments  decreased pt's ROM as she was performing with too large rotational movement in turning horizontally    60 secs - plain background (wall)     X1 Viewing Vertical   Foot Position  bilateral stance - in standing     Comments  60 secs   plain background (wall);        Balance Exercises - 04/11/19 2121      Balance Exercises: Standing   Standing Eyes  Opened  Foam/compliant surface;5 reps   head turns side/side & up/down with targets each way   Standing Eyes Closed  Narrow base of support (BOS);Head turns;Foam/compliant surface;5 reps   feet together - on floor only; EO and EC -        PT Education - 04/11/19 2129    Education Details  Balance on foam with feet apart EO and EC with head turns;  feet together - ON FLOOR - EO and EC; progressed x1 viewing to standing position and increased to 60 secs     Person(s) Educated  Patient    Methods  Explanation;Demonstration;Handout    Comprehension  Verbalized understanding;Returned demonstration          PT Long Term Goals - 04/03/19 1214      PT LONG TERM GOAL #1   Title  Pt will demonstrate independence with vestibular and balance HEP    Time  6    Period  Weeks    Status  New    Target Date  05/18/19      PT LONG TERM GOAL #2   Title  Pt will demonstrate ability to perform bed mobility without symptoms of vertigo    Time  6    Period  Weeks    Status  New    Target Date  05/18/19      PT LONG TERM GOAL #3   Title  Pt will demonstrate negative positional testing for BPPV    Time  6    Period  Weeks    Status  New    Target Date  05/18/19      PT LONG TERM GOAL #4   Title  Pt will report full return to performing supine back stability exercises    Time  6    Period  Weeks    Status  New    Target Date  05/18/19      PT LONG TERM GOAL #5   Title  Gait velocity goal TBD    Time  6    Period  Weeks    Status  New    Target Date  05/18/19            Plan - 04/11/19 2131    Clinical Impression Statement  Pt had (-) Rt Dix-Hallpike test with no nystagmus and no c/o vertigo in test position, indicative of resolution of Rt BPPV;  pt able to perform x1 viewing in standing for 1" with slight increase in symptoms (primary c/o unsteadiness upon completion);  pt reported her orthopedic problems including Lt knee meniscus tear and LBP contributed to her balance  deficits with standing on compliant surface.  Feet together on compliant surface was not attempted due to pt's difficulty (some anxiety)  with standing with feet apart on pillows.  Pt did report at end of session that she would like to continue to work on her balance during next PT session.                                                                              Comorbidities  Vit D deficiency, tinnitus, hearing loss, OSA with CPAP, sciatica, lumbar spondylosis, prediabetes, obesity, hypothyroidism, HTN, depression, OA, anxiety    Examination-Activity Limitations  Bed Mobility;Bend;Locomotion Level;Reach Overhead    Examination-Participation Restrictions  Other    Rehab Potential  Good    PT Frequency  2x / week    PT Duration  6 weeks    PT Treatment/Interventions  ADLs/Self Care Home Management;Canalith Repostioning;Functional mobility training;Therapeutic activities;Therapeutic exercise;Balance training;Neuromuscular re-education;Patient/family education;Vestibular    PT Next Visit Plan  check corner balance exercises given for HEP on 04-11-19 - progress as tolerated:  cont balance training (pt requests this at end of session); (vertigo resolved on 04-11-19 session)    PT Home Exercise Plan  balance on foam with feet apart - EO and EC;  feet together - on floor only;  progressed x1 viewing to standing and increased time from 30 secs to 60 secs    Consulted and Agree with Plan of Care  Patient       Patient will benefit from skilled therapeutic intervention in order to improve the following deficits and impairments:  Decreased balance, Decreased mobility, Dizziness, Difficulty walking  Visit Diagnosis: Unsteadiness on feet  BPPV (benign paroxysmal positional vertigo), right     Problem List Patient Active Problem List   Diagnosis Date Noted  . Left hip pain 02/13/2019  . Chronic pain of left knee 07/17/2018  . Morbid obesity ([facility]) 07/17/2018  . Body mass index 40.0-44.9, adult ([facility])  07/17/2018  . Lumbar spondylosis 04/24/2018  . Allergic rhinitis 04/24/2018  . Anxiety disorder 04/24/2018  . Digital mucous cyst of finger of left hand 10/08/2017  . Acute medial meniscus tear of left knee 07/30/2017  . OSA on CPAP 08/26/2015  . Essential (primary) hypertension 04/05/2015  . Palpitation 08/21/2012  . Unspecified hypothyroidism 08/21/2012    Kayla Marshall, PT 04/11/2019, 9:43 PM  [facility] [address] [place], [place], [address] Phone: [phone]   Fax:  [phone]  Name: Kayla Marshall MRN: [id number] Date of Birth: Nov 19, 1951

## 2019-04-17 DIAGNOSIS — H00025 Hordeolum internum left lower eyelid: Secondary | ICD-10-CM | POA: Diagnosis not present

## 2019-04-17 DIAGNOSIS — H0102B Squamous blepharitis left eye, upper and lower eyelids: Secondary | ICD-10-CM | POA: Diagnosis not present

## 2019-04-17 DIAGNOSIS — H8110 Benign paroxysmal vertigo, unspecified ear: Secondary | ICD-10-CM | POA: Diagnosis not present

## 2019-04-17 DIAGNOSIS — H402231 Chronic angle-closure glaucoma, bilateral, mild stage: Secondary | ICD-10-CM | POA: Diagnosis not present

## 2019-04-17 DIAGNOSIS — F419 Anxiety disorder, unspecified: Secondary | ICD-10-CM | POA: Diagnosis not present

## 2019-04-17 DIAGNOSIS — H9311 Tinnitus, right ear: Secondary | ICD-10-CM | POA: Diagnosis not present

## 2019-04-17 DIAGNOSIS — Z7189 Other specified counseling: Secondary | ICD-10-CM | POA: Diagnosis not present

## 2019-04-17 DIAGNOSIS — H0102A Squamous blepharitis right eye, upper and lower eyelids: Secondary | ICD-10-CM | POA: Diagnosis not present

## 2019-04-17 MED FILL — NEO/POLY/DEXAMET EYE OINT: 3.5-10000-0 | 10 days supply | Qty: 4 | Fill #0

## 2019-04-18 ENCOUNTER — Other Ambulatory Visit: Payer: Self-pay

## 2019-04-18 ENCOUNTER — Encounter: Payer: Self-pay | Admitting: Physical Therapy

## 2019-04-18 ENCOUNTER — Ambulatory Visit: Payer: 59 | Admitting: Physical Therapy

## 2019-04-18 DIAGNOSIS — R42 Dizziness and giddiness: Secondary | ICD-10-CM

## 2019-04-18 DIAGNOSIS — R2681 Unsteadiness on feet: Secondary | ICD-10-CM | POA: Diagnosis not present

## 2019-04-18 DIAGNOSIS — H8111 Benign paroxysmal vertigo, right ear: Secondary | ICD-10-CM | POA: Diagnosis not present

## 2019-04-18 NOTE — Therapy (Signed)
[facility] [address] [place], [place], [address] Phone: [phone]   Fax:  [phone]  Physical Therapy Treatment  Patient Details  Name: Kayla Marshall MRN: [id number] Date of Birth: 03-31-1951 Referring Provider (PT): Harlan Stains, MD   Encounter Date: 04/18/2019  CLINIC OPERATION CHANGES: [facility] is operating at a low capacity due to COVID-19.  The patient was brought into the clinic for evaluation and/or treatment following universal masking by staff, social distancing, and <10 people in the clinic.  The patient's COVID risk of complications score is 3.  PT End of Session - 04/18/19 1805    Visit Number  4    Number of Visits  13    Date for PT Re-Evaluation  05/18/19    Authorization Type  [facility] [employer], $20 copay.  VL: MN    PT Start Time  1318    PT Stop Time  1410    PT Time Calculation (min)  52 min    Activity Tolerance  Patient tolerated treatment well    Behavior During Therapy  WFL for tasks assessed/performed       Past Medical History:  Diagnosis Date  . Allergies   . Anemia   . Anxiety   . Arthritis    back  . B12 deficiency   . Back pain   . Chewing difficulty   . Cold intolerance   . Depression   . Dry mouth   . Fatigue   . Gallbladder disease   . GERD (gastroesophageal reflux disease)   . Glaucoma   . Hay fever   . Headache   . Hoarseness   . Hypertension   . Hypothyroidism   . Irritable bowel   . Joint pain   . Lactose intolerance   . Leg cramps   . Muscle stiffness   . Nervousness   . Obesity   . Palpitations   . PPH (postpartum hemorrhage)   . Prediabetes   . Sciatic pain   . Sciatica   . Shortness of breath dyspnea   . Sleep apnea   . Sleep apnea   . Stress   . Swelling of lower extremity   . Tinnitus   . Torn meniscus    left  . Vitamin D deficiency   . Weakness     Past Surgical History:  Procedure Laterality Date  .  CHOLECYSTECTOMY  1990  . DILATATION & CURETTAGE/HYSTEROSCOPY WITH MYOSURE N/A 11/12/2015   Procedure: DILATATION & CURETTAGE/HYSTEROSCOPY WITH  MYOSURE;  Surgeon: Thurnell Lose, MD;  Location: [facility];  Service: Gynecology;  Laterality: N/A;    There were no vitals filed for this visit.  Subjective Assessment - 04/18/19 1324    Subjective  Pt had a few days where she felt "normal" but started having a little more dizziness today, may be the weather?  Exercises have been going well; mild dizziness with head movements.  Has clearance to go back to work on Wednesday - would like to work on turning quickly.    Pertinent History  Vit D deficiency, tinnitus, hearing loss, OSA with CPAP, sciatica, lumbar spondylosis, prediabetes, obesity, hypothyroidism, HTN, depression, OA, anxiety    Patient Stated Goals  To be able to do back exercises; to be able to walk more.  To be 100% before going back to work; 3 days dizziness free before returning to work    Currently in Pain?  No/denies  Vestibular Assessment - 04/18/19 1333      Dix-Hallpike Right   Dix-Hallpike Right Duration  0     Dix-Hallpike Right Symptoms  No nystagmus                Vestibular Treatment/Exercise - 04/18/19 1335      Vestibular Treatment/Exercise   Vestibular Treatment Provided  Gaze    Gaze Exercises  X1 Viewing Horizontal;X1 Viewing Vertical      X1 Viewing Horizontal   Foot Position  standing, feet apart    Reps  2    Comments  30 seconds; had to review correct technique      X1 Viewing Vertical   Foot Position  standing, feet apart    Reps  2    Comments  30 seconds; had to review correct technique        Gaze Stabilization - Tip Card  1.Target must remain in focus, not blurry, and appear stationary while head is in motion. 2.Perform exercises with small head movements (45 to either side of midline). 3.Increase speed of head motion so long as target is in focus. 4.If you wear  eyeglasses, be sure you can see target through lens (therapist will give specific instructions for bifocal / progressive lenses). 5.These exercises may provoke dizziness or nausea. Work through these symptoms. If too dizzy, slow head movement slightly. Rest between each exercise. 6.Exercises demand concentration; avoid distractions. 7.For safety, perform standing exercises close to a counter, wall, corner, or next to someone.   Gaze Stabilization - Standing Feet Apart   Feet shoulder width apart, keeping eyes on target on wall 3 feet away, tilt head down slightly and move head side to side for 30 seconds. Repeat while moving head up and down for 30 seconds. *Work up to tolerating 60 seconds, as able. Do 2-3 sessions per day.   Feet Together (Compliant Surface) Head Motion - Eyes Closed    Stand on compliant surface: folded up blanket with feet together. START WITH EYES OPEN.  MOVE YOUR HEAD UP AND DOWN 10 TIMES LOOKING FROM TARGET TO TARGET.  REPEAT LOOKING FROM SIDE TO SIDE 10 TIMES Close eyes and move head slowly, up and down 10 TIMES, SIDE TO SIDE 10 TIMES. Do 2 sessions per day.  Turning    Tilt head down, lead with head and eyes and slowly make quarter turn to left with eyes open.  Turn back to middle.  Make quarter turn to right and then back to middle.  Try to increase your speed and pick up feet. Repeat __10__ times per session. Do __2__ sessions per day.  Copyright  VHI. All rights reserved.          PT Education - 04/18/19 1804    Education Details  updated HEP    Person(s) Educated  Patient    Methods  Explanation;Demonstration;Handout    Comprehension  Verbalized understanding;Returned demonstration          PT Long Term Goals - 04/03/19 1214      PT LONG TERM GOAL #1   Title  Pt will demonstrate independence with vestibular and balance HEP    Time  6    Period  Weeks    Status  New    Target Date  05/18/19      PT LONG TERM GOAL #2   Title  Pt  will demonstrate ability to perform bed mobility without symptoms of vertigo    Time  6    Period  Weeks    Status  New    Target Date  05/18/19      PT LONG TERM GOAL #3   Title  Pt will demonstrate negative positional testing for BPPV    Time  6    Period  Weeks    Status  New    Target Date  05/18/19      PT LONG TERM GOAL #4   Title  Pt will report full return to performing supine back stability exercises    Time  6    Period  Weeks    Status  New    Target Date  05/18/19      PT LONG TERM GOAL #5   Title  Gait velocity goal TBD    Time  6    Period  Weeks    Status  New    Target Date  05/18/19            Plan - 04/18/19 1805    Clinical Impression Statement  Performed reassessment of R hallpike-dix with pt continuing to be negative for nystagmus and vertigo.  Continued to address balance impairments with review and progression of current HEP.  Also added quarter turns to begin to address balance during more dynamic head and body turns.  Pt may return to work next week; will discuss specific challenges in her work environment that can be addressed in therapy.  Will continue to address balance impairments and vertigo as needed to progress towards LTG.    Comorbidities  Vit D deficiency, tinnitus, hearing loss, OSA with CPAP, sciatica, lumbar spondylosis, prediabetes, obesity, hypothyroidism, HTN, depression, OA, anxiety    Examination-Activity Limitations  Bed Mobility;Bend;Locomotion Level;Reach Overhead    Examination-Participation Restrictions  Other    Rehab Potential  Good    PT Frequency  2x / week    PT Duration  6 weeks    PT Treatment/Interventions  ADLs/Self Care Home Management;Canalith Repostioning;Functional mobility training;Therapeutic activities;Therapeutic exercise;Balance training;Neuromuscular re-education;Patient/family education;Vestibular    PT Next Visit Plan  continue to work on dynamic balance, turning.  Will she return to work?  Will it affect  therapy?    PT Home Exercise Plan  balance on foam with feet apart - EO and EC;  feet together - on floor only;  progressed x1 viewing to standing and increased time from 30 secs to 60 secs    Consulted and Agree with Plan of Care  Patient       Patient will benefit from skilled therapeutic intervention in order to improve the following deficits and impairments:  Decreased balance, Decreased mobility, Dizziness, Difficulty walking  Visit Diagnosis: Unsteadiness on feet  Dizziness and giddiness     Problem List Patient Active Problem List   Diagnosis Date Noted  . Left hip pain 02/13/2019  . Chronic pain of left knee 07/17/2018  . Morbid obesity ([place]) 07/17/2018  . Body mass index 40.0-44.9, adult ([facility]) 07/17/2018  . Lumbar spondylosis 04/24/2018  . Allergic rhinitis 04/24/2018  . Anxiety disorder 04/24/2018  . Digital mucous cyst of finger of left hand 10/08/2017  . Acute medial meniscus tear of left knee 07/30/2017  . OSA on CPAP 08/26/2015  . Essential (primary) hypertension 04/05/2015  . Palpitation 08/21/2012  . Unspecified hypothyroidism 08/21/2012    Rico Junker, PT, DPT 04/18/19    6:10 PM    [facility] [address] [place], [place], [address] Phone: [phone]   Fax:  [phone]  Name: Kayla Marshall MRN: [id number] Date of Birth: 1951-01-02

## 2019-04-18 NOTE — Patient Instructions (Addendum)
Gaze Stabilization - Tip Card  1.Target must remain in focus, not blurry, and appear stationary while head is in motion. 2.Perform exercises with small head movements (45 to either side of midline). 3.Increase speed of head motion so long as target is in focus. 4.If you wear eyeglasses, be sure you can see target through lens (therapist will give specific instructions for bifocal / progressive lenses). 5.These exercises may provoke dizziness or nausea. Work through these symptoms. If too dizzy, slow head movement slightly. Rest between each exercise. 6.Exercises demand concentration; avoid distractions. 7.For safety, perform standing exercises close to a counter, wall, corner, or next to someone.   Gaze Stabilization - Standing Feet Apart   Feet shoulder width apart, keeping eyes on target on wall 3 feet away, tilt head down slightly and move head side to side for 30 seconds. Repeat while moving head up and down for 30 seconds. *Work up to tolerating 60 seconds, as able. Do 2-3 sessions per day.   Feet Together (Compliant Surface) Head Motion - Eyes Closed    Stand on compliant surface: folded up blanket with feet together. START WITH EYES OPEN.  MOVE YOUR HEAD UP AND DOWN 10 TIMES LOOKING FROM TARGET TO TARGET.  REPEAT LOOKING FROM SIDE TO SIDE 10 TIMES Close eyes and move head slowly, up and down 10 TIMES, SIDE TO SIDE 10 TIMES. Do 2 sessions per day.  Turning    Tilt head down, lead with head and eyes and slowly make quarter turn to left with eyes open.  Turn back to middle.  Make quarter turn to right and then back to middle.  Try to increase your speed and pick up feet. Repeat __10__ times per session. Do __2__ sessions per day.  Copyright  VHI. All rights reserved.

## 2019-04-24 ENCOUNTER — Ambulatory Visit: Payer: 59 | Admitting: Physical Therapy

## 2019-04-28 DIAGNOSIS — R7303 Prediabetes: Secondary | ICD-10-CM | POA: Diagnosis not present

## 2019-04-28 DIAGNOSIS — R9389 Abnormal findings on diagnostic imaging of other specified body structures: Secondary | ICD-10-CM | POA: Diagnosis not present

## 2019-04-28 DIAGNOSIS — Z72820 Sleep deprivation: Secondary | ICD-10-CM | POA: Diagnosis not present

## 2019-04-28 DIAGNOSIS — Z01419 Encounter for gynecological examination (general) (routine) without abnormal findings: Secondary | ICD-10-CM | POA: Diagnosis not present

## 2019-05-01 ENCOUNTER — Other Ambulatory Visit: Payer: Self-pay | Admitting: Obstetrics and Gynecology

## 2019-05-01 DIAGNOSIS — E2839 Other primary ovarian failure: Secondary | ICD-10-CM

## 2019-05-01 DIAGNOSIS — Z1231 Encounter for screening mammogram for malignant neoplasm of breast: Secondary | ICD-10-CM

## 2019-05-08 ENCOUNTER — Encounter: Payer: Self-pay | Admitting: Physical Therapy

## 2019-05-08 ENCOUNTER — Other Ambulatory Visit: Payer: Self-pay

## 2019-05-08 ENCOUNTER — Ambulatory Visit: Payer: 59 | Attending: Family Medicine | Admitting: Physical Therapy

## 2019-05-08 DIAGNOSIS — R42 Dizziness and giddiness: Secondary | ICD-10-CM | POA: Diagnosis not present

## 2019-05-08 DIAGNOSIS — R2681 Unsteadiness on feet: Secondary | ICD-10-CM | POA: Diagnosis not present

## 2019-05-08 DIAGNOSIS — R293 Abnormal posture: Secondary | ICD-10-CM | POA: Diagnosis not present

## 2019-05-08 DIAGNOSIS — M6281 Muscle weakness (generalized): Secondary | ICD-10-CM | POA: Insufficient documentation

## 2019-05-08 NOTE — Therapy (Signed)
[facility] [address] [place], [place], [address] Phone: [phone]   Fax:  [phone]  Physical Therapy Treatment and D/C Summary  Patient Details  Name: Kayla Marshall MRN: [id number] Date of Birth: 03-30-1951 Referring Provider (PT): Harlan Stains, MD   Encounter Date: 05/08/2019   CLINIC OPERATION CHANGES: Outpatient [facility] is open at lower capacity following universal masking, social distancing, and patient screening.  The patient's COVID risk of complications score is 3.   PT End of Session - 05/08/19 1522    Visit Number  5    Number of Visits  13    Date for PT Re-Evaluation  05/18/19    Authorization Type  [place] [employer], $20 copay.  VL: MN    PT Start Time  1400    PT Stop Time  1444    PT Time Calculation (min)  44 min    Activity Tolerance  Patient tolerated treatment well    Behavior During Therapy  WFL for tasks assessed/performed       Past Medical History:  Diagnosis Date  . Allergies   . Anemia   . Anxiety   . Arthritis    back  . B12 deficiency   . Back pain   . Chewing difficulty   . Cold intolerance   . Depression   . Dry mouth   . Fatigue   . Gallbladder disease   . GERD (gastroesophageal reflux disease)   . Glaucoma   . Hay fever   . Headache   . Hoarseness   . Hypertension   . Hypothyroidism   . Irritable bowel   . Joint pain   . Lactose intolerance   . Leg cramps   . Muscle stiffness   . Nervousness   . Obesity   . Palpitations   . PPH (postpartum hemorrhage)   . Prediabetes   . Sciatic pain   . Sciatica   . Shortness of breath dyspnea   . Sleep apnea   . Sleep apnea   . Stress   . Swelling of lower extremity   . Tinnitus   . Torn meniscus    left  . Vitamin D deficiency   . Weakness     Past Surgical History:  Procedure Laterality Date  . CHOLECYSTECTOMY  1990  . DILATATION & CURETTAGE/HYSTEROSCOPY WITH MYOSURE N/A 11/12/2015   Procedure:  DILATATION & CURETTAGE/HYSTEROSCOPY WITH  MYOSURE;  Surgeon: Thurnell Lose, MD;  Location: [facility];  Service: Gynecology;  Laterality: N/A;    There were no vitals filed for this visit.  Subjective Assessment - 05/08/19 1402    Subjective  Since being back at work she has felt disoriented but no spinning/dizziness.  Has been walking but hasn't been able to do balance exercises due to it bothering her knees.    Pertinent History  Vit D deficiency, tinnitus, hearing loss, OSA with CPAP, sciatica, lumbar spondylosis, prediabetes, obesity, hypothyroidism, HTN, depression, OA, anxiety    Patient Stated Goals  To be able to do back exercises; to be able to walk more.  To be 100% before going back to work; 3 days dizziness free before returning to work    Currently in Pain?  No/denies             Vestibular Assessment - 05/08/19 1414      Positional Testing   Dix-Hallpike  Dix-Hallpike Right;Dix-Hallpike Left    Sidelying Test  --    Horizontal Canal Testing  Horizontal Canal Right;Horizontal Canal Left      Dix-Hallpike Right   Dix-Hallpike Right Duration  0    Dix-Hallpike Right Symptoms  No nystagmus      Dix-Hallpike Left   Dix-Hallpike Left Duration  0    Dix-Hallpike Left Symptoms  No nystagmus      Horizontal Canal Right   Horizontal Canal Right Duration  0    Horizontal Canal Right Symptoms  Normal      Horizontal Canal Left   Horizontal Canal Left Duration  0    Horizontal Canal Left Symptoms  Normal                Vestibular Treatment/Exercise - 05/08/19 1459      Vestibular Treatment/Exercise   Vestibular Treatment Provided  Gaze    Gaze Exercises  X1 Viewing Horizontal;X1 Viewing Vertical      X1 Viewing Horizontal   Foot Position  standing feet apart > feet together    Reps  2    Comments  progressed from 30 to 60 seconds      X1 Viewing Vertical   Foot Position  standing feet apart > feet together    Reps  2    Comments  progressed from 30 to  60 seconds         Balance Exercises - 05/08/19 1521      Balance Exercises: Standing   Standing Eyes Opened  Wide (BOA);Foam/compliant surface;Other reps (comment);Head turns   10 reps head turns/nods   Standing Eyes Closed  Narrow base of support (BOS);Wide (BOA);Head turns;Foam/compliant surface;Other reps (comment)   10 reps head turns/nods       PT Education - 05/08/19 1521    Education Details  goals met, final HEP, D/C today    Person(s) Educated  Patient    Methods  Explanation;Demonstration;Handout    Comprehension  Verbalized understanding;Returned demonstration          PT Long Term Goals - 05/08/19 1410      PT LONG TERM GOAL #1   Title  Pt will demonstrate independence with vestibular and balance HEP    Time  6    Period  Weeks    Status  Achieved      PT LONG TERM GOAL #2   Title  Pt will demonstrate ability to perform bed mobility without symptoms of vertigo    Time  6    Period  Weeks    Status  Achieved      PT LONG TERM GOAL #3   Title  Pt will demonstrate negative positional testing for BPPV    Time  6    Period  Weeks    Status  Achieved      PT LONG TERM GOAL #4   Title  Pt will report full return to performing supine back stability exercises    Baseline  Has returned to doing them partially; not afraid to lie in supine anymore    Time  6    Period  Weeks    Status  Partially Met      PT LONG TERM GOAL #5   Title  Gait velocity goal TBD    Time  6    Period  Weeks    Status  Deferred            Plan - 05/08/19 1524    Clinical Impression Statement  Pt returns to PT following return to work.  Pt has made good progress and  has met 3/4 LTG, 5th goal deferred due to not assessed at eval.  Pt no longer experiencing vertigo with bed mobility and demonstrated improvements in balance and gaze adaptation.  Pt was negative for vertigo and nystagmus with positional testing.  Updated and finalized HEP and is ready for D/C today.     Comorbidities  Vit D deficiency, tinnitus, hearing loss, OSA with CPAP, sciatica, lumbar spondylosis, prediabetes, obesity, hypothyroidism, HTN, depression, OA, anxiety    Examination-Activity Limitations  Bed Mobility;Bend;Locomotion Level;Reach Overhead    Examination-Participation Restrictions  Other    Rehab Potential  Good    PT Frequency  2x / week    PT Duration  6 weeks    PT Treatment/Interventions  ADLs/Self Care Home Management;Canalith Repostioning;Functional mobility training;Therapeutic activities;Therapeutic exercise;Balance training;Neuromuscular re-education;Patient/family education;Vestibular    PT Next Visit Plan  D/C    Consulted and Agree with Plan of Care  Patient       Patient will benefit from skilled therapeutic intervention in order to improve the following deficits and impairments:  Decreased balance, Decreased mobility, Dizziness, Difficulty walking  Visit Diagnosis: 1. Unsteadiness on feet   2. Dizziness and giddiness   3. Abnormal posture   4. Muscle weakness (generalized)        Problem List Patient Active Problem List   Diagnosis Date Noted  . Left hip pain 02/13/2019  . Chronic pain of left knee 07/17/2018  . Morbid obesity ([place]) 07/17/2018  . Body mass index 40.0-44.9, adult ([place]) 07/17/2018  . Lumbar spondylosis 04/24/2018  . Allergic rhinitis 04/24/2018  . Anxiety disorder 04/24/2018  . Digital mucous cyst of finger of left hand 10/08/2017  . Acute medial meniscus tear of left knee 07/30/2017  . OSA on CPAP 08/26/2015  . Essential (primary) hypertension 04/05/2015  . Palpitation 08/21/2012  . Unspecified hypothyroidism 08/21/2012    PHYSICAL THERAPY DISCHARGE SUMMARY  Visits from Start of Care: 5  Current functional level related to goals / functional outcomes: Please see LTG achievement and impression statement above   Remaining deficits: Impaired balance   Education / Equipment: HEP  Plan: Patient agrees to discharge.   Patient goals were met. Patient is being discharged due to meeting the stated rehab goals.  ?????     Rico Junker, PT, DPT 05/08/19    3:29 PM  [facility] [address] [place], [place], [address] Phone: [phone]   Fax:  [phone]  Name: Kayla Marshall MRN: [id number] Date of Birth: 06-Sep-1951

## 2019-05-08 NOTE — Patient Instructions (Addendum)
   Feet Together (Compliant Surface) Head Motion - Eyes Closed    Stand on compliant surface: folded up blanket with feet together.  Close your eyes and move your head up and down slowly, 10 times.  Move your head side to side, 10 times keeping your balance. Perform 2 sets, 2 times a day.  Gaze Stabilization: Standing Feet Together    Feet together, keeping eyes on target on wall __3__ feet away, tilt head down 15-30 and move head side to side for __60__ seconds. Repeat while moving head up and down for _60___ seconds. Do __2_ sessions per day. Repeat using target on pattern background.   Marland Kitchen

## 2019-05-09 MED FILL — MELOXICAM 7.5 MG TABLET: 7.5 | 30 days supply | Qty: 60 | Fill #0

## 2019-05-12 ENCOUNTER — Other Ambulatory Visit: Payer: Self-pay

## 2019-06-09 MED FILL — LISINOPRIL 20 MG TABLET: 20 | 90 days supply | Qty: 90 | Fill #1

## 2019-06-09 MED FILL — LATANOPROST 0.005% EYE DRP: 0.005 | 67 days supply | Qty: 8 | Fill #3

## 2019-06-17 ENCOUNTER — Ambulatory Visit (INDEPENDENT_AMBULATORY_CARE_PROVIDER_SITE_OTHER): Payer: 59

## 2019-06-17 ENCOUNTER — Other Ambulatory Visit: Payer: Self-pay

## 2019-06-17 DIAGNOSIS — I6521 Occlusion and stenosis of right carotid artery: Secondary | ICD-10-CM | POA: Diagnosis not present

## 2019-06-18 DIAGNOSIS — M542 Cervicalgia: Secondary | ICD-10-CM | POA: Diagnosis not present

## 2019-06-18 DIAGNOSIS — M9901 Segmental and somatic dysfunction of cervical region: Secondary | ICD-10-CM | POA: Diagnosis not present

## 2019-06-18 DIAGNOSIS — M9903 Segmental and somatic dysfunction of lumbar region: Secondary | ICD-10-CM | POA: Diagnosis not present

## 2019-06-18 DIAGNOSIS — M2669 Other specified disorders of temporomandibular joint: Secondary | ICD-10-CM | POA: Diagnosis not present

## 2019-06-25 DIAGNOSIS — M9903 Segmental and somatic dysfunction of lumbar region: Secondary | ICD-10-CM | POA: Diagnosis not present

## 2019-06-25 DIAGNOSIS — M2669 Other specified disorders of temporomandibular joint: Secondary | ICD-10-CM | POA: Diagnosis not present

## 2019-06-25 DIAGNOSIS — M542 Cervicalgia: Secondary | ICD-10-CM | POA: Diagnosis not present

## 2019-06-25 DIAGNOSIS — M9901 Segmental and somatic dysfunction of cervical region: Secondary | ICD-10-CM | POA: Diagnosis not present

## 2019-06-25 MED FILL — LATANOPROST 0.005% EYE DRP: 0.005 | 75 days supply | Qty: 8 | Fill #0

## 2019-06-30 ENCOUNTER — Other Ambulatory Visit (INDEPENDENT_AMBULATORY_CARE_PROVIDER_SITE_OTHER): Payer: Self-pay | Admitting: Physician Assistant

## 2019-07-04 ENCOUNTER — Telehealth: Payer: Self-pay | Admitting: Physician Assistant

## 2019-07-04 MED ORDER — MELOXICAM 7.5 MG PO TABS: 7.5000 mg | ORAL_TABLET | Freq: Two times a day (BID) | ORAL | 1 refills | Status: DC | PRN

## 2019-07-04 NOTE — Telephone Encounter (Signed)
Sent to pharmacy. Patient aware.  

## 2019-07-04 NOTE — Telephone Encounter (Signed)
Patient is wanting this today. Can you ask Dr Lorin Mercy?

## 2019-07-04 NOTE — Telephone Encounter (Signed)
OK refill?? 

## 2019-07-04 NOTE — Telephone Encounter (Signed)
Can you please advise for Dr. Erlinda Hong?

## 2019-07-04 NOTE — Telephone Encounter (Signed)
Patient called needing Rx refilled  Meloxicam. The number to contact patient is [phone]

## 2019-07-05 MED FILL — MELOXICAM 7.5 MG TABLET: 7.5 | 30 days supply | Qty: 60 | Fill #0

## 2019-07-09 ENCOUNTER — Telehealth: Payer: Self-pay

## 2019-07-09 MED ORDER — MELOXICAM 7.5 MG PO TABS: 7.5000 mg | ORAL_TABLET | Freq: Two times a day (BID) | ORAL | 1 refills | Status: DC | PRN

## 2019-07-09 NOTE — Telephone Encounter (Signed)
rxrf from [employer] for Mobic came today. Ok to rf? Mobic 7.5 #60

## 2019-07-09 NOTE — Telephone Encounter (Signed)
ok 

## 2019-07-09 NOTE — Addendum Note (Signed)
Addended byLaurann Montana on: 07/09/2019 02:39 PM   Modules accepted: Orders

## 2019-07-09 NOTE — Telephone Encounter (Signed)
Refill submitted. 

## 2019-07-10 ENCOUNTER — Other Ambulatory Visit: Payer: Self-pay

## 2019-07-10 ENCOUNTER — Ambulatory Visit
Admission: RE | Admit: 2019-07-10 | Discharge: 2019-07-10 | Disposition: A | Discharge status: Home / Self Care | Payer: 59 | Source: Ambulatory Visit | Attending: Obstetrics and Gynecology | Admitting: Obstetrics and Gynecology

## 2019-07-10 DIAGNOSIS — Z780 Asymptomatic menopausal state: Secondary | ICD-10-CM | POA: Diagnosis not present

## 2019-07-10 DIAGNOSIS — Z13820 Encounter for screening for osteoporosis: Secondary | ICD-10-CM | POA: Diagnosis not present

## 2019-07-10 DIAGNOSIS — Z1231 Encounter for screening mammogram for malignant neoplasm of breast: Secondary | ICD-10-CM | POA: Diagnosis not present

## 2019-07-10 DIAGNOSIS — E2839 Other primary ovarian failure: Secondary | ICD-10-CM

## 2019-07-11 DIAGNOSIS — M9901 Segmental and somatic dysfunction of cervical region: Secondary | ICD-10-CM | POA: Diagnosis not present

## 2019-07-11 DIAGNOSIS — M9903 Segmental and somatic dysfunction of lumbar region: Secondary | ICD-10-CM | POA: Diagnosis not present

## 2019-07-11 DIAGNOSIS — M542 Cervicalgia: Secondary | ICD-10-CM | POA: Diagnosis not present

## 2019-07-11 DIAGNOSIS — M2669 Other specified disorders of temporomandibular joint: Secondary | ICD-10-CM | POA: Diagnosis not present

## 2019-07-14 ENCOUNTER — Other Ambulatory Visit: Payer: Self-pay | Admitting: Obstetrics and Gynecology

## 2019-07-14 DIAGNOSIS — R928 Other abnormal and inconclusive findings on diagnostic imaging of breast: Secondary | ICD-10-CM

## 2019-07-16 ENCOUNTER — Other Ambulatory Visit: Payer: Self-pay | Admitting: Obstetrics and Gynecology

## 2019-07-16 ENCOUNTER — Ambulatory Visit
Admission: RE | Admit: 2019-07-16 | Discharge: 2019-07-16 | Disposition: A | Discharge status: Home / Self Care | Payer: 59 | Source: Ambulatory Visit | Attending: Obstetrics and Gynecology | Admitting: Obstetrics and Gynecology

## 2019-07-16 ENCOUNTER — Other Ambulatory Visit: Payer: 59

## 2019-07-16 ENCOUNTER — Other Ambulatory Visit: Payer: Self-pay

## 2019-07-16 DIAGNOSIS — R928 Other abnormal and inconclusive findings on diagnostic imaging of breast: Secondary | ICD-10-CM

## 2019-07-16 DIAGNOSIS — N6012 Diffuse cystic mastopathy of left breast: Secondary | ICD-10-CM | POA: Diagnosis not present

## 2019-07-16 DIAGNOSIS — N6320 Unspecified lump in the left breast, unspecified quadrant: Secondary | ICD-10-CM

## 2019-07-16 DIAGNOSIS — R922 Inconclusive mammogram: Secondary | ICD-10-CM | POA: Diagnosis not present

## 2019-07-18 DIAGNOSIS — M542 Cervicalgia: Secondary | ICD-10-CM | POA: Diagnosis not present

## 2019-07-18 DIAGNOSIS — M9903 Segmental and somatic dysfunction of lumbar region: Secondary | ICD-10-CM | POA: Diagnosis not present

## 2019-07-18 DIAGNOSIS — M2669 Other specified disorders of temporomandibular joint: Secondary | ICD-10-CM | POA: Diagnosis not present

## 2019-07-18 DIAGNOSIS — M9901 Segmental and somatic dysfunction of cervical region: Secondary | ICD-10-CM | POA: Diagnosis not present

## 2019-07-23 DIAGNOSIS — M9901 Segmental and somatic dysfunction of cervical region: Secondary | ICD-10-CM | POA: Diagnosis not present

## 2019-07-23 DIAGNOSIS — M9903 Segmental and somatic dysfunction of lumbar region: Secondary | ICD-10-CM | POA: Diagnosis not present

## 2019-07-23 DIAGNOSIS — M2669 Other specified disorders of temporomandibular joint: Secondary | ICD-10-CM | POA: Diagnosis not present

## 2019-07-23 DIAGNOSIS — M542 Cervicalgia: Secondary | ICD-10-CM | POA: Diagnosis not present

## 2019-08-04 DIAGNOSIS — M2669 Other specified disorders of temporomandibular joint: Secondary | ICD-10-CM | POA: Diagnosis not present

## 2019-08-04 DIAGNOSIS — M9901 Segmental and somatic dysfunction of cervical region: Secondary | ICD-10-CM | POA: Diagnosis not present

## 2019-08-04 DIAGNOSIS — M542 Cervicalgia: Secondary | ICD-10-CM | POA: Diagnosis not present

## 2019-08-04 DIAGNOSIS — M9903 Segmental and somatic dysfunction of lumbar region: Secondary | ICD-10-CM | POA: Diagnosis not present

## 2019-08-06 DIAGNOSIS — M9903 Segmental and somatic dysfunction of lumbar region: Secondary | ICD-10-CM | POA: Diagnosis not present

## 2019-08-06 DIAGNOSIS — M9901 Segmental and somatic dysfunction of cervical region: Secondary | ICD-10-CM | POA: Diagnosis not present

## 2019-08-06 DIAGNOSIS — M542 Cervicalgia: Secondary | ICD-10-CM | POA: Diagnosis not present

## 2019-08-06 DIAGNOSIS — M2669 Other specified disorders of temporomandibular joint: Secondary | ICD-10-CM | POA: Diagnosis not present

## 2019-08-08 DIAGNOSIS — M542 Cervicalgia: Secondary | ICD-10-CM | POA: Diagnosis not present

## 2019-08-08 DIAGNOSIS — M9901 Segmental and somatic dysfunction of cervical region: Secondary | ICD-10-CM | POA: Diagnosis not present

## 2019-08-08 DIAGNOSIS — M9903 Segmental and somatic dysfunction of lumbar region: Secondary | ICD-10-CM | POA: Diagnosis not present

## 2019-08-08 DIAGNOSIS — M2669 Other specified disorders of temporomandibular joint: Secondary | ICD-10-CM | POA: Diagnosis not present

## 2019-08-11 DIAGNOSIS — M9901 Segmental and somatic dysfunction of cervical region: Secondary | ICD-10-CM | POA: Diagnosis not present

## 2019-08-11 DIAGNOSIS — M542 Cervicalgia: Secondary | ICD-10-CM | POA: Diagnosis not present

## 2019-08-11 DIAGNOSIS — M2669 Other specified disorders of temporomandibular joint: Secondary | ICD-10-CM | POA: Diagnosis not present

## 2019-08-11 DIAGNOSIS — M9903 Segmental and somatic dysfunction of lumbar region: Secondary | ICD-10-CM | POA: Diagnosis not present

## 2019-08-18 DIAGNOSIS — M9901 Segmental and somatic dysfunction of cervical region: Secondary | ICD-10-CM | POA: Diagnosis not present

## 2019-08-18 DIAGNOSIS — M9903 Segmental and somatic dysfunction of lumbar region: Secondary | ICD-10-CM | POA: Diagnosis not present

## 2019-08-18 DIAGNOSIS — M2669 Other specified disorders of temporomandibular joint: Secondary | ICD-10-CM | POA: Diagnosis not present

## 2019-08-18 DIAGNOSIS — M542 Cervicalgia: Secondary | ICD-10-CM | POA: Diagnosis not present

## 2019-08-25 MED FILL — MELOXICAM 7.5 MG TABLET: 7.5 | 30 days supply | Qty: 60 | Fill #1

## 2019-08-27 DIAGNOSIS — M2669 Other specified disorders of temporomandibular joint: Secondary | ICD-10-CM | POA: Diagnosis not present

## 2019-08-27 DIAGNOSIS — M9903 Segmental and somatic dysfunction of lumbar region: Secondary | ICD-10-CM | POA: Diagnosis not present

## 2019-08-27 DIAGNOSIS — M9901 Segmental and somatic dysfunction of cervical region: Secondary | ICD-10-CM | POA: Diagnosis not present

## 2019-08-27 DIAGNOSIS — M542 Cervicalgia: Secondary | ICD-10-CM | POA: Diagnosis not present

## 2019-08-28 MED FILL — LISINOPRIL 20 MG TABLET: 20 | 90 days supply | Qty: 90 | Fill #0

## 2019-09-01 MED FILL — LATANOPROST 0.005% OPTH SOL: 0.005 | 75 days supply | Qty: 8 | Fill #1

## 2019-09-12 DIAGNOSIS — L03031 Cellulitis of right toe: Secondary | ICD-10-CM | POA: Diagnosis not present

## 2019-09-12 DIAGNOSIS — L02611 Cutaneous abscess of right foot: Secondary | ICD-10-CM | POA: Diagnosis not present

## 2019-09-12 DIAGNOSIS — M79674 Pain in right toe(s): Secondary | ICD-10-CM | POA: Diagnosis not present

## 2019-09-29 DIAGNOSIS — R7303 Prediabetes: Secondary | ICD-10-CM | POA: Diagnosis not present

## 2019-09-29 DIAGNOSIS — F419 Anxiety disorder, unspecified: Secondary | ICD-10-CM | POA: Diagnosis not present

## 2019-09-29 DIAGNOSIS — R1012 Left upper quadrant pain: Secondary | ICD-10-CM | POA: Diagnosis not present

## 2019-09-29 MED FILL — FAMOTIDINE 20 MG TABS: 20 | 30 days supply | Qty: 30 | Fill #0

## 2019-09-29 MED FILL — POLYETHYLENE GLYCOL 3350 PO: 17 | 28 days supply | Qty: 476 | Fill #0

## 2019-09-29 MED FILL — ALPRAZolam 0.25 MG TABS: 0.25 | 20 days supply | Qty: 30 | Fill #0

## 2019-10-01 DIAGNOSIS — J342 Deviated nasal septum: Secondary | ICD-10-CM | POA: Insufficient documentation

## 2019-10-01 DIAGNOSIS — G4733 Obstructive sleep apnea (adult) (pediatric): Secondary | ICD-10-CM | POA: Diagnosis not present

## 2019-10-01 DIAGNOSIS — R42 Dizziness and giddiness: Secondary | ICD-10-CM | POA: Diagnosis not present

## 2019-10-01 DIAGNOSIS — H9313 Tinnitus, bilateral: Secondary | ICD-10-CM | POA: Diagnosis not present

## 2019-10-01 DIAGNOSIS — H9193 Unspecified hearing loss, bilateral: Secondary | ICD-10-CM | POA: Diagnosis not present

## 2019-10-06 DIAGNOSIS — H04123 Dry eye syndrome of bilateral lacrimal glands: Secondary | ICD-10-CM | POA: Diagnosis not present

## 2019-10-06 DIAGNOSIS — H402231 Chronic angle-closure glaucoma, bilateral, mild stage: Secondary | ICD-10-CM | POA: Diagnosis not present

## 2019-10-06 DIAGNOSIS — H2513 Age-related nuclear cataract, bilateral: Secondary | ICD-10-CM | POA: Diagnosis not present

## 2019-10-06 DIAGNOSIS — R7309 Other abnormal glucose: Secondary | ICD-10-CM | POA: Diagnosis not present

## 2019-10-10 MED FILL — ALPRAZolam 0.25 MG TABS: 0.25 | 20 days supply | Qty: 30 | Fill #0

## 2019-10-13 MED FILL — MELOXICAM 7.5 MG TABLET: 7.5 | 30 days supply | Qty: 60 | Fill #0

## 2019-10-27 DIAGNOSIS — R202 Paresthesia of skin: Secondary | ICD-10-CM | POA: Diagnosis not present

## 2019-10-27 DIAGNOSIS — Z1389 Encounter for screening for other disorder: Secondary | ICD-10-CM | POA: Diagnosis not present

## 2019-10-27 DIAGNOSIS — R350 Frequency of micturition: Secondary | ICD-10-CM | POA: Diagnosis not present

## 2019-10-28 ENCOUNTER — Emergency Department (HOSPITAL_COMMUNITY)
Admission: EM | Admit: 2019-10-28 | Discharge: 2019-10-28 | Disposition: A | Discharge status: Home / Self Care | Payer: PRIVATE HEALTH INSURANCE | Attending: Emergency Medicine | Admitting: Emergency Medicine

## 2019-10-28 ENCOUNTER — Other Ambulatory Visit: Payer: Self-pay

## 2019-10-28 ENCOUNTER — Emergency Department (HOSPITAL_COMMUNITY): Payer: PRIVATE HEALTH INSURANCE

## 2019-10-28 ENCOUNTER — Encounter (HOSPITAL_COMMUNITY): Payer: Self-pay

## 2019-10-28 DIAGNOSIS — E039 Hypothyroidism, unspecified: Secondary | ICD-10-CM | POA: Diagnosis not present

## 2019-10-28 DIAGNOSIS — I10 Essential (primary) hypertension: Secondary | ICD-10-CM | POA: Insufficient documentation

## 2019-10-28 DIAGNOSIS — T71193A Asphyxiation due to mechanical threat to breathing due to other causes, assault, initial encounter: Secondary | ICD-10-CM | POA: Diagnosis not present

## 2019-10-28 DIAGNOSIS — Z7984 Long term (current) use of oral hypoglycemic drugs: Secondary | ICD-10-CM | POA: Diagnosis not present

## 2019-10-28 DIAGNOSIS — Z79899 Other long term (current) drug therapy: Secondary | ICD-10-CM | POA: Diagnosis not present

## 2019-10-28 DIAGNOSIS — Y990 Civilian activity done for income or pay: Secondary | ICD-10-CM | POA: Diagnosis not present

## 2019-10-28 LAB — I-STAT CREATININE, ED: Creatinine, Ser: 0.7 mg/dL (ref 0.44–1.00)

## 2019-10-28 MED ORDER — IOHEXOL 300 MG/ML  SOLN
75.0000 mL | Freq: Once | INTRAMUSCULAR | Status: AC | PRN
  Administered 2019-10-28: 75 mL via INTRAVENOUS

## 2019-10-28 MED ORDER — SODIUM CHLORIDE (PF) 0.9 % IJ SOLN
INTRAMUSCULAR | Status: AC
  Administered 2019-10-28: 19:00:00
  Filled 2019-10-28: qty 50

## 2019-10-28 MED ORDER — IOHEXOL 300 MG/ML  SOLN: 80.0000 mL | Freq: Once | INTRAMUSCULAR | Status: DC | PRN
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
## 2019-10-28 NOTE — ED Notes (Signed)
Pt report neck tenderness and cervical spin tenderness. Pt voice is horse

## 2019-10-28 NOTE — ED Notes (Signed)
Quentin Cornwall PA at bedside

## 2019-10-28 NOTE — ED Notes (Signed)
Pt in CT.

## 2019-10-28 NOTE — ED Notes (Signed)
Gpd at bedside

## 2019-10-28 NOTE — ED Notes (Signed)
Pt verbalizes understanding of DC instructions. Pt belongings returned and is ambulatory out of ED.  

## 2019-10-28 NOTE — ED Notes (Signed)
Bonnie NT set up RR for urine sample

## 2019-10-28 NOTE — ED Notes (Signed)
Pt ambulatory to the bathroom 
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
## 2019-10-28 NOTE — Discharge Instructions (Addendum)
Please read instructions below. Apply ice to your areas of pain for 20 minutes at a time. You can take tylenol every 6 hours as needed for pain. Schedule an appointment with your primary care provider regarding your visit today. Return to the ER for new or concerning symptoms.

## 2019-10-31 ENCOUNTER — Ambulatory Visit: Payer: 59 | Admitting: Orthopaedic Surgery

## 2019-11-05 ENCOUNTER — Ambulatory Visit: Payer: Self-pay | Admitting: Orthopaedic Surgery

## 2019-11-06 DIAGNOSIS — F4310 Post-traumatic stress disorder, unspecified: Secondary | ICD-10-CM | POA: Diagnosis not present

## 2019-11-06 DIAGNOSIS — T71193D Asphyxiation due to mechanical threat to breathing due to other causes, assault, subsequent encounter: Secondary | ICD-10-CM | POA: Diagnosis not present

## 2019-11-06 DIAGNOSIS — R591 Generalized enlarged lymph nodes: Secondary | ICD-10-CM | POA: Diagnosis not present

## 2019-11-06 MED FILL — LATANOPROST 0.005% OPTH SOL: 0.005 | 75 days supply | Qty: 8 | Fill #2

## 2019-11-06 MED FILL — ESCITALOPRAM 10 MG TABLET: 10 | 30 days supply | Qty: 30 | Fill #0

## 2019-11-07 ENCOUNTER — Other Ambulatory Visit: Payer: Self-pay | Admitting: Family Medicine

## 2019-11-07 DIAGNOSIS — R591 Generalized enlarged lymph nodes: Secondary | ICD-10-CM

## 2019-11-07 MED FILL — OMEPRAZOLE 40 MG CPDR: 40 | 30 days supply | Qty: 30 | Fill #0

## 2019-11-19 ENCOUNTER — Telehealth: Payer: Self-pay | Admitting: *Deleted

## 2019-11-19 NOTE — Telephone Encounter (Signed)
LVM informing patient that her EMG/NCS next Mon had to be canceled; MD out of office. I advised she'll get a call next week to reschedule. Left #.

## 2019-11-20 MED FILL — LATANOPROST 0.005% OPTH SOL: 0.005 | 75 days supply | Qty: 8 | Fill #2

## 2019-11-24 ENCOUNTER — Encounter: Payer: 59 | Admitting: Neurology

## 2019-11-24 ENCOUNTER — Telehealth: Payer: Self-pay | Admitting: Neurology

## 2019-11-24 NOTE — Telephone Encounter (Signed)
Pt would like to know if she can be seen by another provider due to the office canceling her appt for today. Pt does not want to wait for the next Dr Jannifer Franklin availability that was given to her. Please advise.

## 2019-11-25 DIAGNOSIS — R69 Illness, unspecified: Secondary | ICD-10-CM | POA: Diagnosis not present

## 2019-11-27 DIAGNOSIS — M542 Cervicalgia: Secondary | ICD-10-CM | POA: Diagnosis not present

## 2019-11-27 DIAGNOSIS — R69 Illness, unspecified: Secondary | ICD-10-CM | POA: Diagnosis not present

## 2019-11-27 DIAGNOSIS — T71193D Asphyxiation due to mechanical threat to breathing due to other causes, assault, subsequent encounter: Secondary | ICD-10-CM | POA: Diagnosis not present

## 2019-11-27 MED FILL — LISINOPRIL 20 MG TABLET: 20 | 90 days supply | Qty: 90 | Fill #1

## 2019-11-27 MED FILL — MELOXICAM 7.5 MG TABLET: 7.5 | 30 days supply | Qty: 60 | Fill #1

## 2019-12-01 NOTE — Telephone Encounter (Signed)
Nikki in referrals has reached out to patient to offer sooner appt with Dr. Jannifer Franklin for this week.   (see Appt Notes: 1/11 LVM for pt offering sooner apt, req call bk. nb)

## 2019-12-03 ENCOUNTER — Telehealth: Payer: Self-pay | Admitting: Orthopaedic Surgery

## 2019-12-03 NOTE — Telephone Encounter (Signed)
Patient called requesting update on new pharmacy used. Pharmacy to be filled [facility] [address] in [place]. Patient phone number is [phone]

## 2019-12-04 DIAGNOSIS — R69 Illness, unspecified: Secondary | ICD-10-CM | POA: Diagnosis not present

## 2019-12-09 ENCOUNTER — Other Ambulatory Visit: Payer: 59

## 2019-12-09 DIAGNOSIS — R69 Illness, unspecified: Secondary | ICD-10-CM | POA: Diagnosis not present

## 2019-12-12 DIAGNOSIS — T71193S Asphyxiation due to mechanical threat to breathing due to other causes, assault, sequela: Secondary | ICD-10-CM | POA: Diagnosis not present

## 2019-12-12 DIAGNOSIS — R69 Illness, unspecified: Secondary | ICD-10-CM | POA: Diagnosis not present

## 2019-12-12 DIAGNOSIS — M542 Cervicalgia: Secondary | ICD-10-CM | POA: Insufficient documentation

## 2019-12-17 DIAGNOSIS — R69 Illness, unspecified: Secondary | ICD-10-CM | POA: Diagnosis not present

## 2019-12-22 DIAGNOSIS — R69 Illness, unspecified: Secondary | ICD-10-CM | POA: Diagnosis not present

## 2020-01-06 ENCOUNTER — Ambulatory Visit: Payer: Medicare HMO | Admitting: Neurology

## 2020-01-06 ENCOUNTER — Encounter: Payer: Self-pay | Admitting: Neurology

## 2020-01-06 ENCOUNTER — Ambulatory Visit (INDEPENDENT_AMBULATORY_CARE_PROVIDER_SITE_OTHER): Payer: Medicare HMO | Admitting: Neurology

## 2020-01-06 ENCOUNTER — Other Ambulatory Visit: Payer: Self-pay

## 2020-01-06 DIAGNOSIS — R200 Anesthesia of skin: Secondary | ICD-10-CM

## 2020-01-06 DIAGNOSIS — E1142 Type 2 diabetes mellitus with diabetic polyneuropathy: Secondary | ICD-10-CM

## 2020-01-06 HISTORY — DX: Type 2 diabetes mellitus with diabetic polyneuropathy: E11.42

## 2020-01-06 NOTE — Progress Notes (Signed)
[facility]    Nerve / Sites Muscle Latency Ref. Amplitude Ref. Rel Amp Segments Distance Velocity Ref. Area    ms ms mV mV %  cm m/s m/s mVms  L Peroneal - EDB     Ankle EDB 4.5 ?6.5 2.1 ?2.0 100 Ankle - EDB 9   4.3     Fib head EDB 11.8  2.1  98.5 Fib head - Ankle 28 38 ?44 4.3     Pop fossa EDB 13.1  2.0  97.2 Pop fossa - Fib head 5 38 ?44 4.0         Pop fossa - Ankle      R Peroneal - EDB     Ankle EDB 4.1 ?6.5 0.4 ?2.0 100 Ankle - EDB 9   1.7     Fib head EDB 12.5  0.2  44.1 Fib head - Ankle 28 33 ?44 0.7     Pop fossa EDB 14.0  0.2  98.3 Pop fossa - Fib head 5 32 ?44 0.7         Pop fossa - Ankle      L Tibial - AH     Ankle AH 4.0 ?5.8 1.2 ?4.0 100 Ankle - AH 9   4.6     Pop fossa AH 14.8  0.2  15.7 Pop fossa - Ankle 33 30 ?41 0.3  R Tibial - AH     Ankle AH 4.4 ?5.8 1.2 ?4.0 100 Ankle - AH 9   4.2     Pop fossa AH NR  NR  NR Pop fossa - Ankle 33 NR ?41 NR             SNC    Nerve / Sites Rec. Site Peak Lat Ref.  Amp Ref. Segments Distance    ms ms V V  cm  L Sural - Ankle (Calf)     Calf Ankle NR ?4.4 NR ?6 Calf - Ankle 14  R Sural - Ankle (Calf)     Calf Ankle NR ?4.4 NR ?6 Calf - Ankle 14  L Superficial peroneal - Ankle     Lat leg Ankle NR ?4.4 NR ?6 Lat leg - Ankle 14  R Superficial peroneal - Ankle     Lat leg Ankle NR ?4.4 NR ?6 Lat leg - Ankle 14             F  Wave    Nerve F Lat Ref.   ms ms  L Tibial - AH 65.7 ?56.0  R Tibial - AH 62.8 ?56.0

## 2020-01-06 NOTE — Procedures (Signed)
     HISTORY:  Kayla Marshall is a 69 year old patient with history of obesity and borderline diabetes who has noted some numbness in the feet and burning in the feet over the last 3 to 4 months.  She does have some back pain with pain down both legs, right greater than left.  She is being evaluated for a possible neuropathy or a lumbar radiculopathy.  NERVE CONDUCTION STUDIES:  Nerve conduction studies were performed on both the lower extremities.  The distal motor latencies for the peroneal and posterior tibial nerves were within normal limits bilaterally with low motor amplitudes for the right peroneal nerve and for the posterior tibial nerves bilaterally, normal for the left peroneal nerve.  Slowing was seen for the peroneal nerves bilaterally and for the left posterior tibial nerve.  No response following stimulation of the right posterior tibial nerve at the popliteal fossa was noted, a nerve conduction velocity could not be calculated for this nerve.  The sural and peroneal sensory latencies were unobtainable bilaterally.  The F-wave latencies for the posterior tibial nerves were prolonged bilaterally.  EMG STUDIES:  EMG study was performed on the right lower extremity:  The tibialis anterior muscle reveals 2 to 5K motor units with slightly decreased recruitment. No fibrillations or positive waves were seen. The peroneus tertius muscle reveals 2 to 5K motor units with slightly decreased recruitment. No fibrillations or positive waves were seen. The medial gastrocnemius muscle reveals 1 to 3K motor units with slightly decreased recruitment. No fibrillations or positive waves were seen. The vastus lateralis muscle reveals 2 to 4K motor units with full recruitment. No fibrillations or positive waves were seen. The iliopsoas muscle reveals 2 to 4K motor units with full recruitment. No fibrillations or positive waves were seen. The biceps femoris muscle (long head) reveals 2 to 4K motor units with  full recruitment. No fibrillations or positive waves were seen. The lumbosacral paraspinal muscles were tested at 3 levels, and revealed no abnormalities of insertional activity at all 3 levels tested. There was good relaxation.   IMPRESSION:  Nerve conduction studies done on both lower extremities show evidence of a sensorimotor peripheral neuropathy of moderate severity, possibly related to the borderline diabetes.  EMG evaluation of the right lower extremity shows mild distal chronic neuropathic denervation consistent with the diagnosis of peripheral neuropathy, no clear evidence of an overlying lumbosacral radiculopathy was seen.   Jill Alexanders MD 01/06/2020 3:49 PM  [facility] [address] [place], [place] [address]  Phone [phone] Fax [phone]

## 2020-01-06 NOTE — Progress Notes (Signed)
Please refer to EMG and nerve conduction procedure note.  

## 2020-01-09 DIAGNOSIS — F332 Major depressive disorder, recurrent severe without psychotic features: Secondary | ICD-10-CM | POA: Diagnosis not present

## 2020-01-09 DIAGNOSIS — R69 Illness, unspecified: Secondary | ICD-10-CM | POA: Diagnosis not present

## 2020-01-09 DIAGNOSIS — F411 Generalized anxiety disorder: Secondary | ICD-10-CM | POA: Diagnosis not present

## 2020-01-15 DIAGNOSIS — R69 Illness, unspecified: Secondary | ICD-10-CM | POA: Diagnosis not present

## 2020-01-15 DIAGNOSIS — G629 Polyneuropathy, unspecified: Secondary | ICD-10-CM | POA: Diagnosis not present

## 2020-01-15 DIAGNOSIS — M7989 Other specified soft tissue disorders: Secondary | ICD-10-CM | POA: Diagnosis not present

## 2020-01-15 DIAGNOSIS — R0981 Nasal congestion: Secondary | ICD-10-CM | POA: Diagnosis not present

## 2020-01-16 ENCOUNTER — Other Ambulatory Visit: Payer: Self-pay | Admitting: Physician Assistant

## 2020-01-16 ENCOUNTER — Other Ambulatory Visit: Payer: Self-pay

## 2020-01-16 ENCOUNTER — Telehealth: Payer: Self-pay | Admitting: Orthopaedic Surgery

## 2020-01-16 MED ORDER — MELOXICAM 7.5 MG PO TABS: 7.5000 mg | ORAL_TABLET | Freq: Two times a day (BID) | ORAL | 1 refills | Status: DC | PRN

## 2020-01-16 MED FILL — MELOXICAM 7.5 MG TABLET: 7.5 | 30 days supply | Qty: 60 | Fill #0

## 2020-01-16 NOTE — Telephone Encounter (Signed)
Ok to refill 

## 2020-01-16 NOTE — Telephone Encounter (Signed)
Patient called needing Rx refilled (Meloxicam) [facility] [address]    Ph# [phone]    The number to contact patient is [phone]

## 2020-01-16 NOTE — Telephone Encounter (Signed)
Called into pharm  

## 2020-01-19 ENCOUNTER — Other Ambulatory Visit: Payer: 59

## 2020-01-19 NOTE — Telephone Encounter (Signed)
Patient called. Says she changed pharmacies. She would [facility] [address]

## 2020-01-20 ENCOUNTER — Other Ambulatory Visit: Payer: Self-pay

## 2020-01-20 ENCOUNTER — Encounter: Payer: Self-pay | Admitting: Dietician

## 2020-01-20 ENCOUNTER — Encounter: Payer: Medicare HMO | Attending: Family Medicine | Admitting: Dietician

## 2020-01-20 DIAGNOSIS — E1140 Type 2 diabetes mellitus with diabetic neuropathy, unspecified: Secondary | ICD-10-CM | POA: Insufficient documentation

## 2020-01-20 DIAGNOSIS — E119 Type 2 diabetes mellitus without complications: Secondary | ICD-10-CM | POA: Insufficient documentation

## 2020-01-20 NOTE — Progress Notes (Signed)
Patient was seen on 01/20/20 for the first of a series of three diabetes self-management courses at [facility].  Patient Education Plan per assessed needs and concerns is to attend three course education program for Diabetes Self Management Education.  The following learning objectives were met by the patient during this class:  Describe diabetes, types of diabetes and pathophysiology  State some common risk factors for diabetes  Defines the role of glucose and insulin  Describe the relationship between diabetes and cardiovascular and other risks  State the members of the Healthcare Team  States the rationale for glucose monitoring and when to test  State their individual [place] the importance of logging glucose readings and how to interpret the readings  Identifies A1C target  Explain the correlation between A1c and eAG values  State symptoms and treatment of high blood glucose and low blood glucose  Explain proper technique for glucose testing and identify proper sharps disposal  Handouts given during class include:  How to Thrive:  A Guide for Your Journey with Diabetes by the ADA  Meal Plan Card and carbohydrate content list  Dietary intake form  Low Sodium Flavoring Tips  Types of Fats  Dining Out  Label reading  Snack list  Planning a balanced meal  The diabetes portion plate  Diabetes Resources  A1c to eAG Conversion Chart  Blood Glucose Log  Diabetes Recommended Care Schedule  Support Group  Diabetes Success Plan  Core Class Satisfaction Survey   Follow-Up Plan:  Attend core 2

## 2020-01-22 DIAGNOSIS — H43391 Other vitreous opacities, right eye: Secondary | ICD-10-CM | POA: Diagnosis not present

## 2020-01-27 ENCOUNTER — Encounter: Payer: Self-pay | Admitting: Dietician

## 2020-01-27 ENCOUNTER — Other Ambulatory Visit: Payer: Self-pay

## 2020-01-27 ENCOUNTER — Encounter: Payer: Medicare HMO | Admitting: Dietician

## 2020-01-27 DIAGNOSIS — E119 Type 2 diabetes mellitus without complications: Secondary | ICD-10-CM | POA: Diagnosis not present

## 2020-01-27 DIAGNOSIS — E1140 Type 2 diabetes mellitus with diabetic neuropathy, unspecified: Secondary | ICD-10-CM | POA: Diagnosis not present

## 2020-01-27 NOTE — Progress Notes (Signed)
Patient was seen on 01/27/2020 for the second of a series of three diabetes self-management courses at the [facility]. The following learning objectives were met by the patient during this class:   Describe the role of different macronutrients on glucose  Explain how carbohydrates affect blood glucose  State what foods contain the most carbohydrates  Demonstrate carbohydrate counting  Demonstrate how to read Nutrition Facts food label  Describe effects of various fats on heart health  Describe the importance of good nutrition for health and healthy eating strategies  Describe techniques for managing your shopping, cooking and meal planning  List strategies to follow meal plan when dining out  Describe the effects of alcohol on glucose and how to use it safely  Goals:  Follow Diabetes Meal Plan as instructed  Aim to spread carbs evenly throughout the day  Aim for 3 meals per day and snacks as needed Include lean protein foods to meals/snacks  Monitor glucose levels as instructed by your doctor   Follow-Up Plan:  Attend Core 3  Work towards following your personal food plan.   

## 2020-02-02 ENCOUNTER — Ambulatory Visit: Payer: Medicare HMO

## 2020-02-02 ENCOUNTER — Other Ambulatory Visit: Payer: Self-pay

## 2020-02-02 ENCOUNTER — Encounter: Payer: Self-pay | Admitting: Podiatrist

## 2020-02-02 ENCOUNTER — Ambulatory Visit: Payer: Medicare HMO | Admitting: Podiatrist

## 2020-02-02 VITALS — Temp 97.0°F

## 2020-02-02 DIAGNOSIS — L851 Acquired keratosis [keratoderma] palmaris et plantaris: Secondary | ICD-10-CM

## 2020-02-02 DIAGNOSIS — M79672 Pain in left foot: Secondary | ICD-10-CM

## 2020-02-02 DIAGNOSIS — M216X9 Other acquired deformities of unspecified foot: Secondary | ICD-10-CM | POA: Diagnosis not present

## 2020-02-02 NOTE — Progress Notes (Signed)
   Chief Complaint  Patient presents with  . Painful lesion    Left foot; plantar forefoot-submet 5; pt stated, "hurts when walks"; x2+ months     HPI: Patient is 69 y.o. female who presents today for follow-up of painful lesions on the plantar aspect of the left foot.  Patient states she gets small lesions that formed in this area and they become painful to walk.  She also states she [facility] and she was never able to wear them comfortably.  She states she was unable to return for adjustment due to Covid.   Allergies  Allergen Reactions  . Erythromycin     "Rectal bleeding."  . Avelox [Moxifloxacin Hcl In Nacl]   . Meloxicam     Dizziness.   . Moxifloxacin     Other reaction(s): presyncopal    Review of systems is reviewed and negative.   Physical Exam  Patient is awake, alert, and oriented x 3.  In no acute distress.    Vascular status is intact with palpable pedal pulses DP and PT bilateral and capillary refill time less than 3 seconds bilateral.  No edema or erythema noted.  Neurological exam reveals epicritic and protective sensation grossly intact bilateral.  Dermatological exam reveals skin is supple and dry to bilateral feet.  No open lesions present.  Multiple small intractable plantar keratosis are present on the plantar aspect of the left foot submetatarsal 5.  Skin tension lines are present throughout and there are deeply enucleated.  Musculoskeletal exam: Musculature intact with dorsiflexion, plantarflexion, inversion, eversion.  Prominent and plantarflexed fifth metatarsal head is noted left.   Assessment: 1.  Prominent and plantarflexed fifth metatarsal left foot 2, intractable hyperkeratotic lesions submetatarsal 5 left foot  Plan: Discussed treatment options and alternatives.  Today I carefully excised the deeply nucleated lesions.  Salena cane was applied followed by tape.  I also discussed that the orthotics  will likely need to be adjusted.  She will make an appointment to be seen for the orthotic adjustment with hopes of offloading the submetatarsal 5 region of the left foot.

## 2020-02-03 ENCOUNTER — Telehealth: Payer: Self-pay | Admitting: Dietician

## 2020-02-03 ENCOUNTER — Encounter: Payer: Self-pay | Admitting: Podiatrist

## 2020-02-03 NOTE — Telephone Encounter (Signed)
Brief nutrition note: Called patient as she missed the Diabetes Core 3 class today.  She states that she is fine but overbooked and has rescheduled the class.  Antonieta Iba, RD, LDN, CDE

## 2020-02-05 DIAGNOSIS — F419 Anxiety disorder, unspecified: Secondary | ICD-10-CM | POA: Diagnosis not present

## 2020-02-05 DIAGNOSIS — R69 Illness, unspecified: Secondary | ICD-10-CM | POA: Diagnosis not present

## 2020-02-05 DIAGNOSIS — F331 Major depressive disorder, recurrent, moderate: Secondary | ICD-10-CM | POA: Diagnosis not present

## 2020-02-09 DIAGNOSIS — R69 Illness, unspecified: Secondary | ICD-10-CM | POA: Diagnosis not present

## 2020-02-09 DIAGNOSIS — F411 Generalized anxiety disorder: Secondary | ICD-10-CM | POA: Diagnosis not present

## 2020-02-09 DIAGNOSIS — F332 Major depressive disorder, recurrent severe without psychotic features: Secondary | ICD-10-CM | POA: Diagnosis not present

## 2020-02-10 ENCOUNTER — Other Ambulatory Visit: Payer: Self-pay

## 2020-02-12 DIAGNOSIS — H43391 Other vitreous opacities, right eye: Secondary | ICD-10-CM | POA: Diagnosis not present

## 2020-02-12 DIAGNOSIS — H0102A Squamous blepharitis right eye, upper and lower eyelids: Secondary | ICD-10-CM | POA: Diagnosis not present

## 2020-02-12 DIAGNOSIS — H0102B Squamous blepharitis left eye, upper and lower eyelids: Secondary | ICD-10-CM | POA: Diagnosis not present

## 2020-02-12 DIAGNOSIS — H1045 Other chronic allergic conjunctivitis: Secondary | ICD-10-CM | POA: Diagnosis not present

## 2020-02-16 DIAGNOSIS — I10 Essential (primary) hypertension: Secondary | ICD-10-CM | POA: Diagnosis not present

## 2020-02-16 DIAGNOSIS — M545 Low back pain: Secondary | ICD-10-CM | POA: Diagnosis not present

## 2020-02-16 DIAGNOSIS — R69 Illness, unspecified: Secondary | ICD-10-CM | POA: Diagnosis not present

## 2020-02-16 DIAGNOSIS — R300 Dysuria: Secondary | ICD-10-CM | POA: Diagnosis not present

## 2020-02-24 ENCOUNTER — Ambulatory Visit: Payer: Medicare HMO

## 2020-03-05 ENCOUNTER — Other Ambulatory Visit: Payer: Medicare HMO | Admitting: Orthotics

## 2020-03-08 DIAGNOSIS — E1169 Type 2 diabetes mellitus with other specified complication: Secondary | ICD-10-CM | POA: Diagnosis not present

## 2020-03-08 DIAGNOSIS — F332 Major depressive disorder, recurrent severe without psychotic features: Secondary | ICD-10-CM | POA: Diagnosis not present

## 2020-03-08 DIAGNOSIS — I10 Essential (primary) hypertension: Secondary | ICD-10-CM | POA: Diagnosis not present

## 2020-03-08 DIAGNOSIS — F411 Generalized anxiety disorder: Secondary | ICD-10-CM | POA: Diagnosis not present

## 2020-03-08 DIAGNOSIS — R69 Illness, unspecified: Secondary | ICD-10-CM | POA: Diagnosis not present

## 2020-03-08 DIAGNOSIS — M62838 Other muscle spasm: Secondary | ICD-10-CM | POA: Diagnosis not present

## 2020-03-17 ENCOUNTER — Other Ambulatory Visit: Payer: Self-pay

## 2020-03-28 ENCOUNTER — Other Ambulatory Visit: Payer: Self-pay | Admitting: Orthopaedic Surgery

## 2020-04-02 ENCOUNTER — Other Ambulatory Visit: Payer: Medicare HMO

## 2020-04-07 ENCOUNTER — Telehealth: Payer: Self-pay | Admitting: Orthopaedic Surgery

## 2020-04-07 MED ORDER — MELOXICAM 7.5 MG PO TABS: 7.5000 mg | ORAL_TABLET | Freq: Two times a day (BID) | ORAL | 6 refills | Status: DC | PRN

## 2020-04-07 NOTE — Telephone Encounter (Signed)
Patient called advised she is out of Meloxicam and need Rx refilled. The number to contact patient is [phone]

## 2020-04-09 ENCOUNTER — Other Ambulatory Visit: Payer: Medicare HMO

## 2020-04-14 DIAGNOSIS — H402231 Chronic angle-closure glaucoma, bilateral, mild stage: Secondary | ICD-10-CM | POA: Diagnosis not present

## 2020-04-14 DIAGNOSIS — H43393 Other vitreous opacities, bilateral: Secondary | ICD-10-CM | POA: Diagnosis not present

## 2020-04-14 DIAGNOSIS — H43391 Other vitreous opacities, right eye: Secondary | ICD-10-CM | POA: Diagnosis not present

## 2020-04-22 ENCOUNTER — Ambulatory Visit
Admission: RE | Admit: 2020-04-22 | Discharge: 2020-04-22 | Disposition: A | Discharge status: Home / Self Care | Payer: Medicare HMO | Source: Ambulatory Visit | Attending: Obstetrics and Gynecology | Admitting: Obstetrics and Gynecology

## 2020-04-22 ENCOUNTER — Other Ambulatory Visit: Payer: Self-pay

## 2020-04-22 DIAGNOSIS — N6002 Solitary cyst of left breast: Secondary | ICD-10-CM | POA: Diagnosis not present

## 2020-04-22 DIAGNOSIS — N6320 Unspecified lump in the left breast, unspecified quadrant: Secondary | ICD-10-CM

## 2020-05-14 DIAGNOSIS — F332 Major depressive disorder, recurrent severe without psychotic features: Secondary | ICD-10-CM | POA: Diagnosis not present

## 2020-05-14 DIAGNOSIS — R69 Illness, unspecified: Secondary | ICD-10-CM | POA: Diagnosis not present

## 2020-05-14 DIAGNOSIS — F411 Generalized anxiety disorder: Secondary | ICD-10-CM | POA: Diagnosis not present

## 2020-06-01 DIAGNOSIS — F329 Major depressive disorder, single episode, unspecified: Secondary | ICD-10-CM | POA: Diagnosis not present

## 2020-06-01 DIAGNOSIS — F4310 Post-traumatic stress disorder, unspecified: Secondary | ICD-10-CM | POA: Diagnosis not present

## 2020-06-01 DIAGNOSIS — R69 Illness, unspecified: Secondary | ICD-10-CM | POA: Diagnosis not present

## 2020-06-03 DIAGNOSIS — I10 Essential (primary) hypertension: Secondary | ICD-10-CM | POA: Diagnosis not present

## 2020-06-03 DIAGNOSIS — R69 Illness, unspecified: Secondary | ICD-10-CM | POA: Diagnosis not present

## 2020-06-03 DIAGNOSIS — E1169 Type 2 diabetes mellitus with other specified complication: Secondary | ICD-10-CM | POA: Diagnosis not present

## 2020-06-08 DIAGNOSIS — F332 Major depressive disorder, recurrent severe without psychotic features: Secondary | ICD-10-CM | POA: Diagnosis not present

## 2020-06-08 DIAGNOSIS — R69 Illness, unspecified: Secondary | ICD-10-CM | POA: Diagnosis not present

## 2020-06-08 DIAGNOSIS — F411 Generalized anxiety disorder: Secondary | ICD-10-CM | POA: Diagnosis not present

## 2020-06-17 DIAGNOSIS — M9901 Segmental and somatic dysfunction of cervical region: Secondary | ICD-10-CM | POA: Diagnosis not present

## 2020-06-17 DIAGNOSIS — M9902 Segmental and somatic dysfunction of thoracic region: Secondary | ICD-10-CM | POA: Diagnosis not present

## 2020-06-17 DIAGNOSIS — M5412 Radiculopathy, cervical region: Secondary | ICD-10-CM | POA: Diagnosis not present

## 2020-06-17 DIAGNOSIS — M546 Pain in thoracic spine: Secondary | ICD-10-CM | POA: Diagnosis not present

## 2020-06-18 ENCOUNTER — Ambulatory Visit: Payer: Medicare HMO | Attending: Internal Medicine

## 2020-06-18 DIAGNOSIS — Z23 Encounter for immunization: Secondary | ICD-10-CM

## 2020-06-18 NOTE — Progress Notes (Signed)
   Covid-19 Vaccination Clinic  Name:  KATURAH KARAPETIAN    MRN: [id number] DOB: 02-03-1951  06/18/2020  Ms. Pagett was observed post Covid-19 immunization for 15 minutes without incident. She was provided with Vaccine Information Sheet and instruction to access the V-Safe system.   Ms. Hoffmeister was instructed to call 911 with any severe reactions post vaccine: Mar[place] Kitchen Difficulty breathing  . Swelling of face and throat  . A fast heartbeat  . A bad rash all over body  . Dizziness and weakness   Immunizations Administered    Name Date Dose VIS Date Route   Pfizer COVID-19 Vaccine 06/18/2020 10:37 AM 0.3 mL 01/14/2019 Intramuscular   Manufacturer: [place]   Lot: [id number]   [place]: [address]-7

## 2020-06-24 DIAGNOSIS — M9902 Segmental and somatic dysfunction of thoracic region: Secondary | ICD-10-CM | POA: Diagnosis not present

## 2020-06-24 DIAGNOSIS — M9903 Segmental and somatic dysfunction of lumbar region: Secondary | ICD-10-CM | POA: Diagnosis not present

## 2020-06-24 DIAGNOSIS — M62830 Muscle spasm of back: Secondary | ICD-10-CM | POA: Diagnosis not present

## 2020-06-24 DIAGNOSIS — M5412 Radiculopathy, cervical region: Secondary | ICD-10-CM | POA: Diagnosis not present

## 2020-06-24 DIAGNOSIS — M9901 Segmental and somatic dysfunction of cervical region: Secondary | ICD-10-CM | POA: Diagnosis not present

## 2020-06-24 DIAGNOSIS — M25562 Pain in left knee: Secondary | ICD-10-CM | POA: Diagnosis not present

## 2020-06-24 DIAGNOSIS — M5441 Lumbago with sciatica, right side: Secondary | ICD-10-CM | POA: Diagnosis not present

## 2020-06-24 DIAGNOSIS — M546 Pain in thoracic spine: Secondary | ICD-10-CM | POA: Diagnosis not present

## 2020-06-24 DIAGNOSIS — M25561 Pain in right knee: Secondary | ICD-10-CM | POA: Diagnosis not present

## 2020-07-01 DIAGNOSIS — G514 Facial myokymia: Secondary | ICD-10-CM | POA: Diagnosis not present

## 2020-07-01 DIAGNOSIS — H43393 Other vitreous opacities, bilateral: Secondary | ICD-10-CM | POA: Diagnosis not present

## 2020-07-05 DIAGNOSIS — R69 Illness, unspecified: Secondary | ICD-10-CM | POA: Diagnosis not present

## 2020-07-05 DIAGNOSIS — F332 Major depressive disorder, recurrent severe without psychotic features: Secondary | ICD-10-CM | POA: Diagnosis not present

## 2020-07-05 DIAGNOSIS — F411 Generalized anxiety disorder: Secondary | ICD-10-CM | POA: Diagnosis not present

## 2020-07-06 DIAGNOSIS — G4733 Obstructive sleep apnea (adult) (pediatric): Secondary | ICD-10-CM | POA: Diagnosis not present

## 2020-08-02 DIAGNOSIS — R69 Illness, unspecified: Secondary | ICD-10-CM | POA: Diagnosis not present

## 2020-08-02 DIAGNOSIS — F332 Major depressive disorder, recurrent severe without psychotic features: Secondary | ICD-10-CM | POA: Diagnosis not present

## 2020-08-02 DIAGNOSIS — F410 Panic disorder [episodic paroxysmal anxiety] without agoraphobia: Secondary | ICD-10-CM | POA: Diagnosis not present

## 2020-08-02 DIAGNOSIS — F411 Generalized anxiety disorder: Secondary | ICD-10-CM | POA: Diagnosis not present

## 2020-08-17 DIAGNOSIS — R7309 Other abnormal glucose: Secondary | ICD-10-CM | POA: Diagnosis not present

## 2020-08-17 DIAGNOSIS — H2513 Age-related nuclear cataract, bilateral: Secondary | ICD-10-CM | POA: Diagnosis not present

## 2020-08-17 DIAGNOSIS — H402231 Chronic angle-closure glaucoma, bilateral, mild stage: Secondary | ICD-10-CM | POA: Diagnosis not present

## 2020-08-17 DIAGNOSIS — H43393 Other vitreous opacities, bilateral: Secondary | ICD-10-CM | POA: Diagnosis not present

## 2020-09-14 DIAGNOSIS — F332 Major depressive disorder, recurrent severe without psychotic features: Secondary | ICD-10-CM | POA: Diagnosis not present

## 2020-09-14 DIAGNOSIS — R69 Illness, unspecified: Secondary | ICD-10-CM | POA: Diagnosis not present

## 2020-09-14 DIAGNOSIS — F410 Panic disorder [episodic paroxysmal anxiety] without agoraphobia: Secondary | ICD-10-CM | POA: Diagnosis not present

## 2020-09-14 DIAGNOSIS — F411 Generalized anxiety disorder: Secondary | ICD-10-CM | POA: Diagnosis not present

## 2020-09-22 IMAGING — MG DIGITAL SCREENING BILATERAL MAMMOGRAM WITH TOMO AND CAD
6 of 10 series · 6 of 30 positions shown · non-contrast
Comparison: None.

CLINICAL DATA: Screening.

EXAM:
DIGITAL SCREENING BILATERAL MAMMOGRAM WITH TOMO AND CAD

[L MLO synth-2D (1 of 2)]
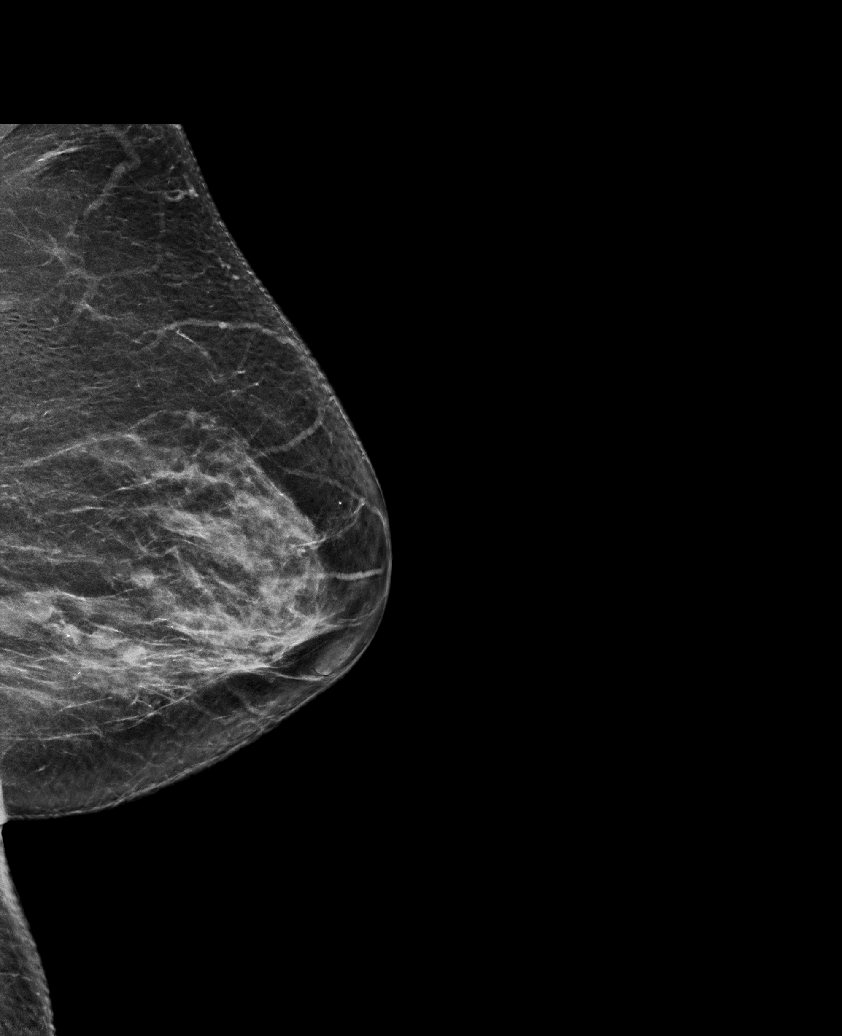

[L CC synth-2D]
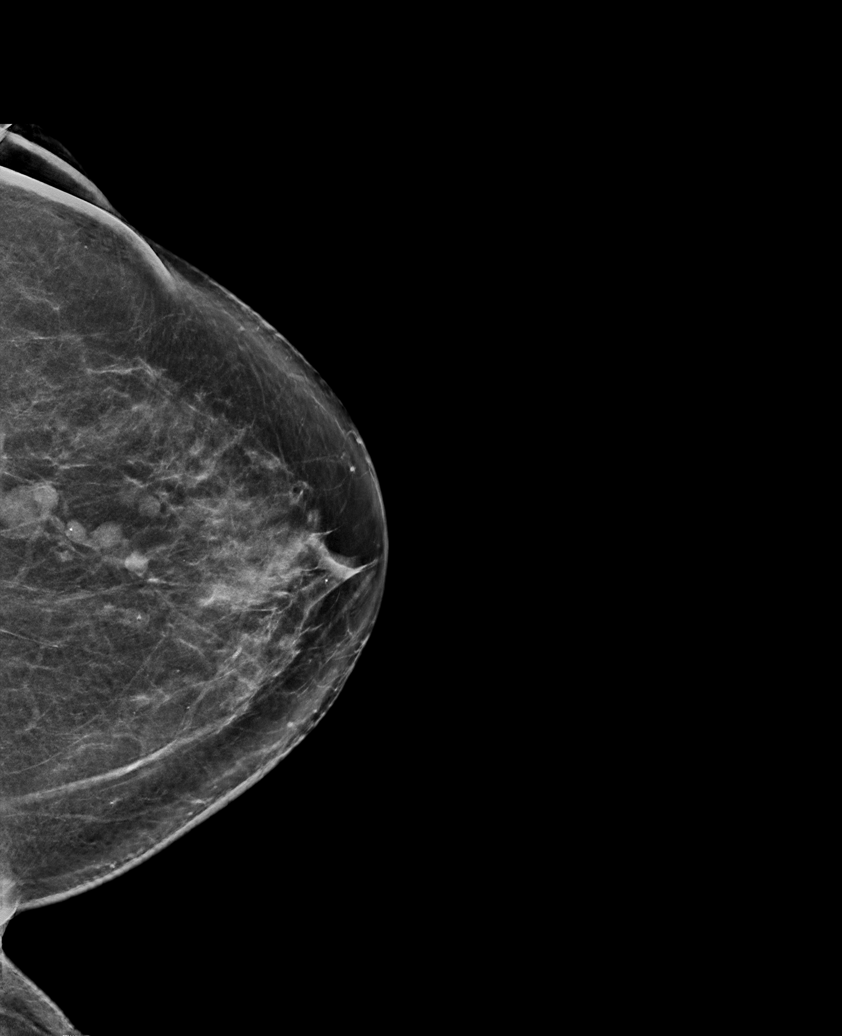

[R CC synth-2D]
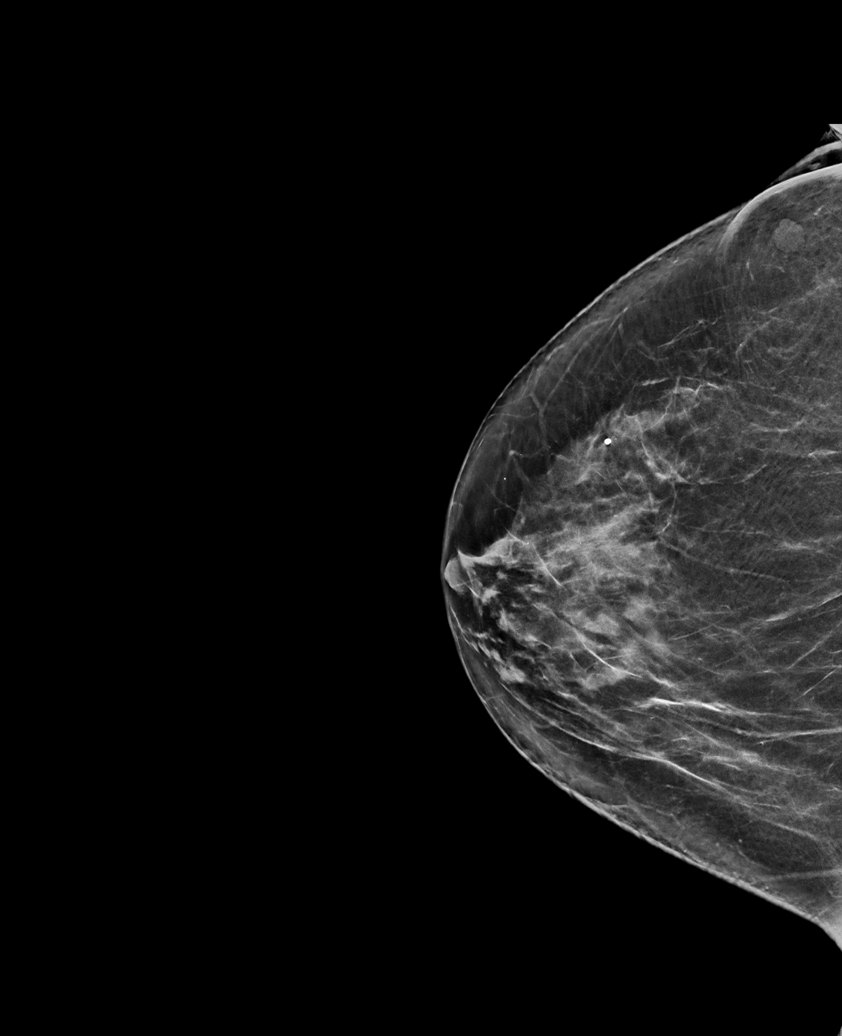

[R MLO synth-2D]
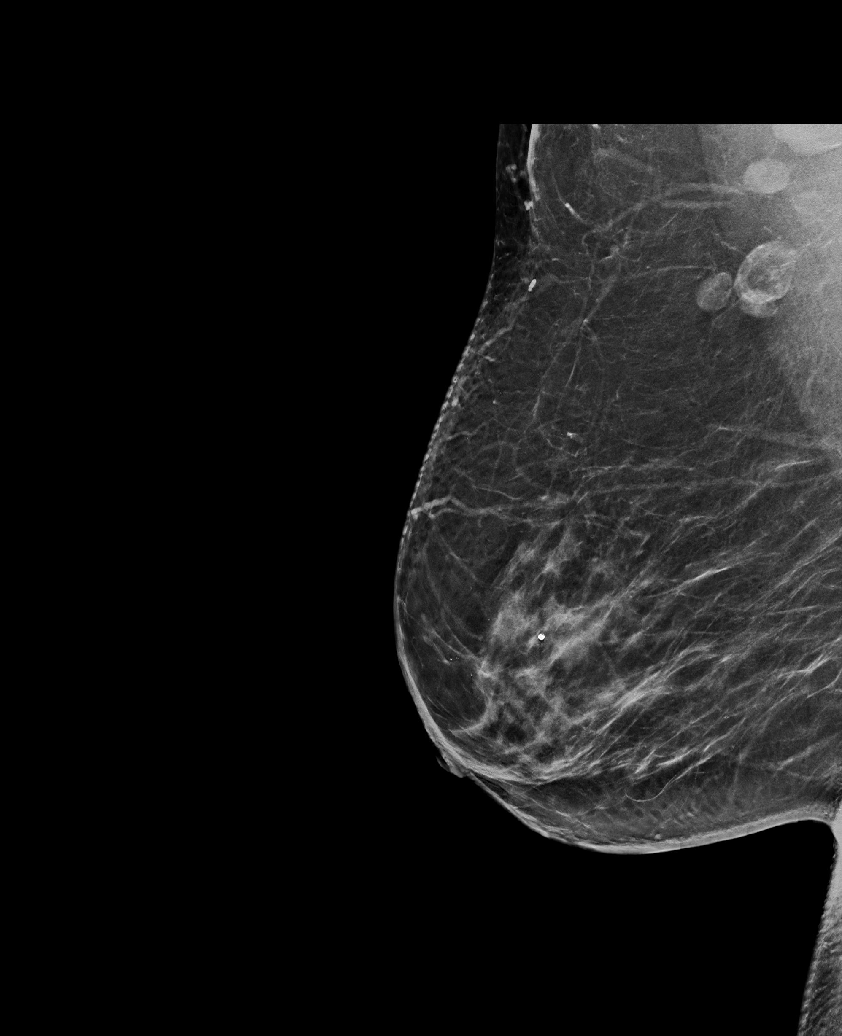

[L MLO synth-2D (2 of 2)]
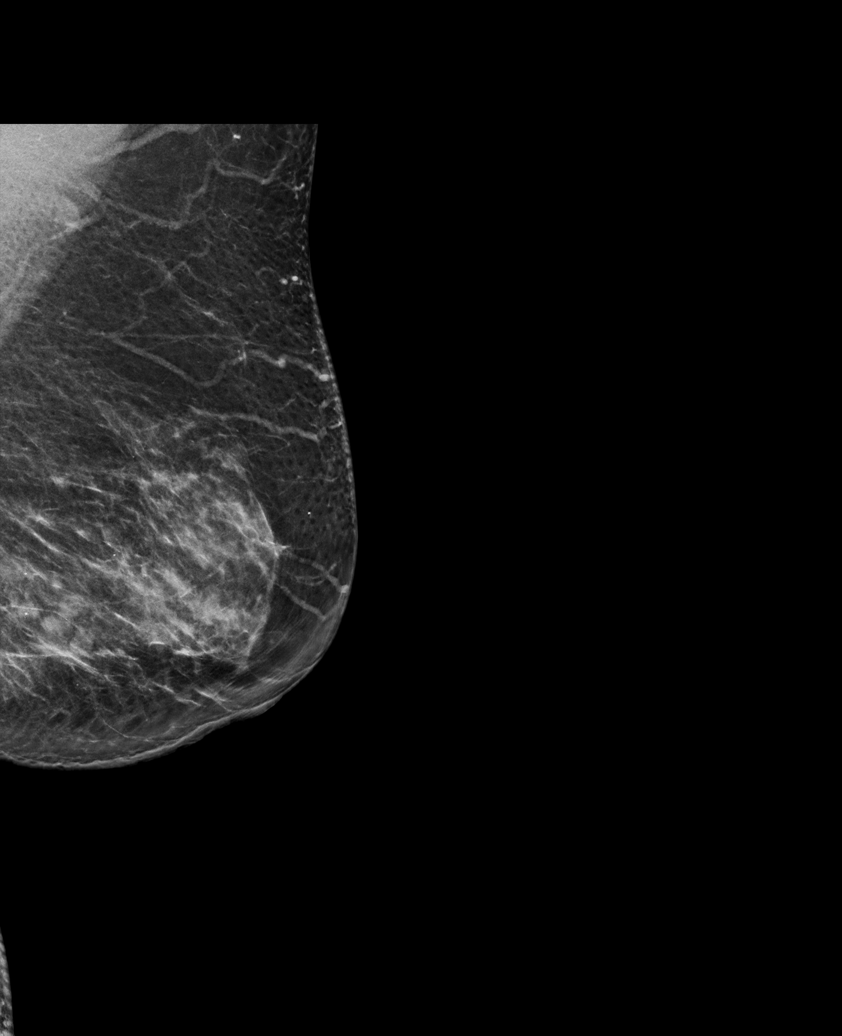

[R CC tomo · tomo slice 35/70.0]
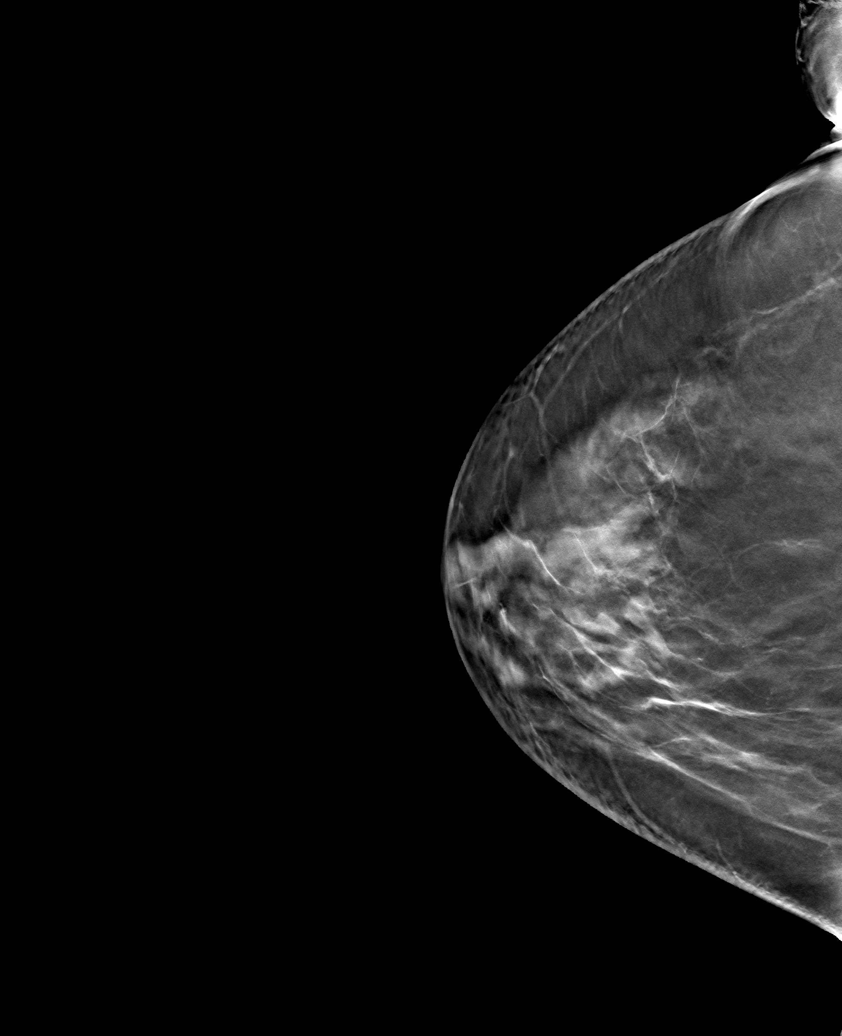

[6 of 30 positions shown; findings below may reference images not displayed]

ACR Breast Density Category c: The breast tissue is heterogeneously
dense, which may obscure small masses.
FINDINGS: In the left breast, possible masses or focal ductal dilation
warrants further evaluation. In the right breast, no findings
suspicious for malignancy.

Images were processed with CAD.
IMPRESSION: Further evaluation is suggested for possible masses or focal ductal
dilation in the left breast.

RECOMMENDATION:
Diagnostic mammogram and possibly ultrasound of the left breast.
(Code:[id number]-[id number])

The patient will be contacted regarding the findings, and additional
imaging will be scheduled.

BI-RADS CATEGORY  0: Incomplete. Need additional imaging evaluation
and/or prior mammograms for comparison.

## 2020-09-24 DIAGNOSIS — I10 Essential (primary) hypertension: Secondary | ICD-10-CM | POA: Diagnosis not present

## 2020-09-24 DIAGNOSIS — R69 Illness, unspecified: Secondary | ICD-10-CM | POA: Diagnosis not present

## 2020-09-24 DIAGNOSIS — M47816 Spondylosis without myelopathy or radiculopathy, lumbar region: Secondary | ICD-10-CM | POA: Diagnosis not present

## 2020-09-24 DIAGNOSIS — E1169 Type 2 diabetes mellitus with other specified complication: Secondary | ICD-10-CM | POA: Diagnosis not present

## 2020-09-28 ENCOUNTER — Telehealth: Payer: Self-pay

## 2020-09-28 NOTE — Telephone Encounter (Signed)
Yes we can see her sooner.  We can also go ahead and send in some prednisone if she would like.  Let me know what she wants to do.

## 2020-09-28 NOTE — Telephone Encounter (Signed)
Patient called. Stated she messed her back up last Wednesday carrying something up her stairs. She has not been able to sleep for the last 2 nights. She has tried lidocaine patches, HEP from prior PT visits she has had for this issue. States has history of back issues.  She was requesting appointment with Xu/Lindsey. I put her in for Friday morning but she is wanting to know if she could possibly be seen sooner? If not asking what else she could possibly do for the pain. Please advise. Thanks Contact for patient [phone]

## 2020-09-29 ENCOUNTER — Telehealth: Payer: Self-pay | Admitting: Orthopaedic Surgery

## 2020-09-29 ENCOUNTER — Ambulatory Visit (INDEPENDENT_AMBULATORY_CARE_PROVIDER_SITE_OTHER): Payer: Medicare HMO

## 2020-09-29 ENCOUNTER — Encounter: Payer: Self-pay | Admitting: Orthopaedic Surgery

## 2020-09-29 ENCOUNTER — Other Ambulatory Visit: Payer: Self-pay

## 2020-09-29 ENCOUNTER — Ambulatory Visit: Payer: Medicare HMO | Admitting: Orthopaedic Surgery

## 2020-09-29 ENCOUNTER — Telehealth: Payer: Self-pay

## 2020-09-29 DIAGNOSIS — M545 Low back pain, unspecified: Secondary | ICD-10-CM

## 2020-09-29 MED ORDER — DICLOFENAC SODIUM 75 MG PO TBEC: 75.0000 mg | DELAYED_RELEASE_TABLET | Freq: Two times a day (BID) | ORAL | 0 refills | Status: DC | PRN

## 2020-09-29 MED ORDER — METHOCARBAMOL 500 MG PO TABS: 500.0000 mg | ORAL_TABLET | Freq: Two times a day (BID) | ORAL | 0 refills | Status: DC | PRN

## 2020-09-29 MED ORDER — PREDNISONE 5 MG (21) PO TBPK: ORAL_TABLET | ORAL | 0 refills | Status: DC

## 2020-09-29 NOTE — Telephone Encounter (Signed)
This is what we discussed.  Robaxin is muscle relaxer and prednisone is the steroid.  Voltaren pills are if she cannot tolerate the side effects of the steroid and she needs to stop the steroid.

## 2020-09-29 NOTE — Telephone Encounter (Signed)
Pt called stating she had an appt this morning believed she was going to have a muscle relaxer and a steroid called in for her but her pharmacy was sent robaxin, prednisone, and Voltaren. Pt would like a CB to make sure this is correct and to explain what the extra rx is for  [phone]

## 2020-09-29 NOTE — Telephone Encounter (Signed)
I called the pt and she states that she will use the Voltaren and not the steroid because she has PTSD from being told that she is diabetic and she does not want the steroid to raise her blood sugar.

## 2020-09-29 NOTE — Progress Notes (Signed)
Office Visit Note   Patient: Kayla Marshall           Date of Birth: August 26, 1951           MRN: [id number] Visit Date: 09/29/2020              Requested by: Harlan Stains, MD [place],  [place] [address] PCP: Harlan Stains, MD   Assessment & Plan: Visit Diagnoses:  1. Low back pain, unspecified back pain laterality, unspecified chronicity, unspecified whether sciatica present     Plan: Impression is left lower back pain and left lower extremity radiculopathy.  We have discussed starting a steroid taper followed by anti-inflammatories.  She may also take muscle relaxers as needed.  I called all of this in.  I have also sent an internal referral for physical therapy, but due to financial constraints she may not be able to attend.  She notes that she still has exercises from previous physical therapy visits for nearly the same issue.  Follow-up with [place] as needed.  Follow-Up Instructions: Return if symptoms worsen or fail to improve.   Orders:  Orders Placed This Encounter  Procedures  . XR Lumbar Spine Complete  . Ambulatory referral to Physical Therapy   Meds ordered this encounter  Medications  . predniSONE (STERAPRED UNI-PAK 21 TAB) 5 MG (21) TBPK tablet    Sig: Take as directed.  Do not take any other nsaids while on this medication    Dispense:  21 tablet    Refill:  0  . methocarbamol (ROBAXIN) 500 MG tablet    Sig: Take 1 tablet (500 mg total) by mouth 2 (two) times daily as needed.    Dispense:  20 tablet    Refill:  0  . diclofenac (VOLTAREN) 75 MG EC tablet    Sig: Take 1 tablet (75 mg total) by mouth 2 (two) times daily as needed. Do not take while on sterapred taper    Dispense:  60 tablet    Refill:  0      Procedures: No procedures performed   Clinical Data: No additional findings.   Subjective: Chief Complaint  Patient presents with  . Lower Back - Pain     HPI is an 69 year old female who comes in today with left lower  back pain which began about a week ago.  She notes that she was carrying something heavy all of a sudden she felt a catching sensation in the middle of her back rating to the left lower extremity.  Pain she has is to the left lower back and radiates into the buttocks down into the groin and to the left knee at times.  She notes that she just cannot get comfortable.  Sitting for long time as well as lying down seems to aggravate her symptoms.  She has tried meloxicam and Tylenol without significant relief of symptoms.  She does note numbness and tingling to both feet but notes that she may have new onset of neuropathy.  She notes that he she has urinated more frequently but no other dysfunction.  Review of Systems as detailed in HPI.  All others reviewed and are negative.   Objective: Vital Signs: There were no vitals taken for this visit.  Physical Exam well-developed well-nourished female no acute distress.  Alert oriented x3.  Ortho Exam lumbar spine exam reveals mild tenderness to the lower lumbar spine.  Positive straight leg raise on the left.  She has  increased pain with lumbar flexion and extension.  Negative logroll negative FADIR.  She is neurovascular intact distally.  Specialty Comments:  No specialty comments available.  Imaging: XR Lumbar Spine Complete  Result Date: 09/29/2020 X-rays demonstrate anterolisthesis L4-5 as well as sacralization.    PMFS History: Patient Active Problem List   Diagnosis Date Noted  . Diabetic peripheral neuropathy ([place]) 01/06/2020  . Pain of neck with recent traumatic injury 12/12/2019  . Deviated septum 10/01/2019  . Left hip pain 02/13/2019  . Chronic pain of left knee 07/17/2018  . Morbid obesity ([place]) 07/17/2018  . Body mass index 40.0-44.9, adult ([place]) 07/17/2018  . Lumbar spondylosis 04/24/2018  . Allergic rhinitis 04/24/2018  . Anxiety disorder 04/24/2018  . Digital mucous cyst of finger of left hand 10/08/2017  . Acute medial  meniscus tear of left knee 07/30/2017  . OSA on CPAP 08/26/2015  . Essential (primary) hypertension 04/05/2015  . Palpitation 08/21/2012  . Unspecified hypothyroidism 08/21/2012   Past Medical History:  Diagnosis Date  . Allergies   . Anemia   . Anxiety   . Arthritis    back  . B12 deficiency   . Back pain   . Chewing difficulty   . Cold intolerance   . Depression   . Diabetic peripheral neuropathy ([place]) 01/06/2020  . Dry mouth   . Fatigue   . Gallbladder disease   . GERD (gastroesophageal reflux disease)   . Glaucoma   . Hay fever   . Headache   . Hoarseness   . Hypertension   . Hypothyroidism   . Irritable bowel   . Joint pain   . Lactose intolerance   . Leg cramps   . Muscle stiffness   . Nervousness   . Obesity   . Palpitations   . PPH (postpartum hemorrhage)   . Prediabetes   . Sciatic pain   . Sciatica   . Shortness of breath dyspnea   . Sleep apnea   . Sleep apnea   . Stress   . Swelling of lower extremity   . Tinnitus   . Torn meniscus    left  . Vitamin D deficiency   . Weakness     Family History  Problem Relation Age of Onset  . COPD Mother   . Stroke Mother   . Hypertension Mother   . Anxiety disorder Mother   . COPD Father   . Heart disease Father   . Depression Father   . Cancer Sister   . Breast cancer Sister     Past Surgical History:  Procedure Laterality Date  . CHOLECYSTECTOMY  1990  . DILATATION & CURETTAGE/HYSTEROSCOPY WITH MYOSURE N/A 11/12/2015   Procedure: DILATATION & CURETTAGE/HYSTEROSCOPY WITH  MYOSURE;  Surgeon: Thurnell Lose, MD;  Location: [facility];  Service: Gynecology;  Laterality: N/A;   Social History   Occupational History  . Occupation: Presenter, broadcasting  Tobacco Use  . Smoking status: Never Smoker  . Smokeless tobacco: Never Used  Substance and Sexual Activity  . Alcohol use: Yes    Comment: occas  . Drug use: No  . Sexual activity: Yes    Birth control/protection: Post-menopausal

## 2020-09-29 NOTE — Telephone Encounter (Signed)
Talked with patient and advised her of message in chart per Dr. Erlinda Hong.  Patient is coming into the office this morning.

## 2020-10-01 ENCOUNTER — Other Ambulatory Visit: Payer: Self-pay

## 2020-10-01 ENCOUNTER — Ambulatory Visit (INDEPENDENT_AMBULATORY_CARE_PROVIDER_SITE_OTHER): Payer: Medicare HMO

## 2020-10-01 ENCOUNTER — Encounter: Payer: Self-pay | Admitting: Orthopaedic Surgery

## 2020-10-01 ENCOUNTER — Ambulatory Visit (INDEPENDENT_AMBULATORY_CARE_PROVIDER_SITE_OTHER): Payer: Medicare HMO | Admitting: Orthopaedic Surgery

## 2020-10-01 DIAGNOSIS — G8929 Other chronic pain: Secondary | ICD-10-CM | POA: Diagnosis not present

## 2020-10-01 DIAGNOSIS — M25562 Pain in left knee: Secondary | ICD-10-CM

## 2020-10-01 MED ORDER — TRAMADOL HCL 50 MG PO TABS: 50.0000 mg | ORAL_TABLET | Freq: Three times a day (TID) | ORAL | 0 refills | Status: DC | PRN

## 2020-10-01 MED ORDER — LIDOCAINE HCL 1 % IJ SOLN
2.0000 mL | INTRAMUSCULAR | Status: AC | PRN
  Administered 2020-10-01: 2 mL

## 2020-10-01 MED ORDER — METHYLPREDNISOLONE ACETATE 40 MG/ML IJ SUSP
40.0000 mg | INTRAMUSCULAR | Status: AC | PRN
  Administered 2020-10-01: 40 mg via INTRA_ARTICULAR

## 2020-10-01 MED ORDER — BUPIVACAINE HCL 0.25 % IJ SOLN
2.0000 mL | INTRAMUSCULAR | Status: AC | PRN
  Administered 2020-10-01: 2 mL via INTRA_ARTICULAR

## 2020-10-01 NOTE — Progress Notes (Signed)
Office Visit Note   Patient: Kayla Marshall           Date of Birth: 03/01/1951           MRN: [id number] Visit Date: 10/01/2020              Requested by: Harlan Stains, MD [place],  [place] [address] PCP: Harlan Stains, MD   Assessment & Plan: Visit Diagnoses:  1. Chronic pain of left knee     Plan: Impression is left knee pain with underlying osteoarthritis and left lower extremity radiculopathy.  We have discussed cortisone injection to the left knee for which the patient would like to proceed.  Have also called in a prescription for tramadol to take as needed.  I have reinforced that she needs to give this time before her symptoms improved.  She will follow up with [place] as needed.  Follow-Up Instructions: Return if symptoms worsen or fail to improve.   Orders:  Orders Placed This Encounter  Procedures  . Large Joint Inj  . XR Knee Complete 4 Views Left   Meds ordered this encounter  Medications  . traMADol (ULTRAM) 50 MG tablet    Sig: Take 1 tablet (50 mg total) by mouth 3 (three) times daily as needed.    Dispense:  30 tablet    Refill:  0      Procedures: Large Joint Inj: L knee on 10/01/2020 10:07 AM Indications: pain Details: 22 G needle, anterolateral approach Medications: 2 mL lidocaine 1 %; 2 mL bupivacaine 0.25 %; 40 mg methylPREDNISolone acetate 40 MG/ML      Clinical Data: No additional findings.   Subjective: Chief Complaint  Patient presents with  . Lower Back - Pain    HPI patient is a pleasant 69 year old female who comes in today with complaints of left knee pain.  She was here in our office 2 days ago with pain from her left lower back radiating down the left leg and into the knee.  I called in a steroid taper, Voltaren and Robaxin at that point in time.  She notes that she was afraid to take the steroid due to it possibly increasing her blood sugar as she is a prediabetic.  She has been taking Voltaren without  relief of symptoms.  She has not tried the Robaxin.  She is complaining mostly of pain to her left knee today and notes that she has not slept in the past 4 days.  No new injury or change in activity.  Review of Systems as detailed in HPI.  All others reviewed and are negative.   Objective: Vital Signs: There were no vitals taken for this visit.  Physical Exam well-developed well-nourished female no acute distress.  Alert and oriented x3.  Ortho Exam left knee exam shows range of motion from 0 to 100 degrees.  She has medial joint line tenderness.  She has a positive straight leg raise.  Negative logroll negative FADIR.  She is neurovascular intact distally.  Specialty Comments:  No specialty comments available.  Imaging: XR Knee Complete 4 Views Left  Result Date: 10/01/2020 Mild to moderate degenerative changes to the medial compartment    PMFS History: Patient Active Problem List   Diagnosis Date Noted  . Diabetic peripheral neuropathy ([place]) 01/06/2020  . Pain of neck with recent traumatic injury 12/12/2019  . Deviated septum 10/01/2019  . Left hip pain 02/13/2019  . Chronic pain of left knee 07/17/2018  .  Morbid obesity ([place]) 07/17/2018  . Body mass index 40.0-44.9, adult ([place]) 07/17/2018  . Lumbar spondylosis 04/24/2018  . Allergic rhinitis 04/24/2018  . Anxiety disorder 04/24/2018  . Digital mucous cyst of finger of left hand 10/08/2017  . Acute medial meniscus tear of left knee 07/30/2017  . OSA on CPAP 08/26/2015  . Essential (primary) hypertension 04/05/2015  . Palpitation 08/21/2012  . Unspecified hypothyroidism 08/21/2012   Past Medical History:  Diagnosis Date  . Allergies   . Anemia   . Anxiety   . Arthritis    back  . B12 deficiency   . Back pain   . Chewing difficulty   . Cold intolerance   . Depression   . Diabetic peripheral neuropathy ([place]) 01/06/2020  . Dry mouth   . Fatigue   . Gallbladder disease   . GERD (gastroesophageal reflux disease)    . Glaucoma   . Hay fever   . Headache   . Hoarseness   . Hypertension   . Hypothyroidism   . Irritable bowel   . Joint pain   . Lactose intolerance   . Leg cramps   . Muscle stiffness   . Nervousness   . Obesity   . Palpitations   . PPH (postpartum hemorrhage)   . Prediabetes   . Sciatic pain   . Sciatica   . Shortness of breath dyspnea   . Sleep apnea   . Sleep apnea   . Stress   . Swelling of lower extremity   . Tinnitus   . Torn meniscus    left  . Vitamin D deficiency   . Weakness     Family History  Problem Relation Age of Onset  . COPD Mother   . Stroke Mother   . Hypertension Mother   . Anxiety disorder Mother   . COPD Father   . Heart disease Father   . Depression Father   . Cancer Sister   . Breast cancer Sister     Past Surgical History:  Procedure Laterality Date  . CHOLECYSTECTOMY  1990  . DILATATION & CURETTAGE/HYSTEROSCOPY WITH MYOSURE N/A 11/12/2015   Procedure: DILATATION & CURETTAGE/HYSTEROSCOPY WITH  MYOSURE;  Surgeon: Thurnell Lose, MD;  Location: [facility];  Service: Gynecology;  Laterality: N/A;   Social History   Occupational History  . Occupation: Presenter, broadcasting  Tobacco Use  . Smoking status: Never Smoker  . Smokeless tobacco: Never Used  Substance and Sexual Activity  . Alcohol use: Yes    Comment: occas  . Drug use: No  . Sexual activity: Yes    Birth control/protection: Post-menopausal

## 2020-10-02 DIAGNOSIS — M545 Low back pain, unspecified: Secondary | ICD-10-CM | POA: Diagnosis not present

## 2020-10-04 ENCOUNTER — Telehealth: Payer: Self-pay

## 2020-10-04 DIAGNOSIS — M545 Low back pain, unspecified: Secondary | ICD-10-CM | POA: Diagnosis not present

## 2020-10-04 NOTE — Telephone Encounter (Signed)
Patient called stating that she is still have a lot of pain and that the Rx for Robaxin and Tramadol is not helping.  Stated that she would like some advise on a Rx for Prednisone?  Cb# [phone].  Please advise.  Thank you.

## 2020-10-04 NOTE — Telephone Encounter (Signed)
See message below. She would like CB also.

## 2020-10-05 ENCOUNTER — Encounter: Payer: Self-pay | Admitting: Family Medicine

## 2020-10-05 ENCOUNTER — Ambulatory Visit (INDEPENDENT_AMBULATORY_CARE_PROVIDER_SITE_OTHER): Payer: Medicare HMO | Admitting: Family Medicine

## 2020-10-05 ENCOUNTER — Other Ambulatory Visit: Payer: Self-pay

## 2020-10-05 DIAGNOSIS — R69 Illness, unspecified: Secondary | ICD-10-CM | POA: Diagnosis not present

## 2020-10-05 DIAGNOSIS — E1140 Type 2 diabetes mellitus with diabetic neuropathy, unspecified: Secondary | ICD-10-CM | POA: Diagnosis not present
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
## 2020-10-05 NOTE — Telephone Encounter (Signed)
She can certainly try the prednisone that I called in when I saw her the first time last week.  If she does this, make sure not taking the diclofenac pills or any other nsaids.  If she does not want this, I can call in norco and she can stop the tramadol

## 2020-10-05 NOTE — Patient Instructions (Addendum)
While you are on prednisone, it is especially important to limit carbohydrate.    To maintain stable blood sugars, it will be important to eat with regularity, starting with breakfast within the first hour of being up.   If you need to, set phone alarms to remind you to eat.  Sometimes, you may need to practice "preventive eating," which is eating at least a small amount even when you are not hungry IF you will not have a chance to eat until you become over-hungry.    Remember: One common symptom of low blood sugar is depression.    If you start checking fasting blood sugars, a good goal is 100 to 110.  If you get an especially high or low reading, then write down what you ate last, how much, and what time.    Once you are able to walk again, you will get the most benefit to your blood sugar levels by walking right after your biggest meal.    Goals:  1. Eat at least 3 REAL meals and 1-2 snacks per day.  Eat breakfast within one hour of getting up.  Aim for no more than 5 hours between eating.   A REAL meal includes at least some protein, some starch, and vegetables and/or fruit.  (OR: Would you serve this to a guest in your home, and call it a meal?)  2. Limit starchy foods to ONE carb portion per meal or snack. ONE portion of a starchy food is equal to the following:   - ONE slice of bread (or its equivalent, such as half of a hamburger bun).   - 1/2 cup of a "scoopable" starchy food such as potatoes or rice.   - 15 grams of Total Carbohydrate as shown on food label.   - Every 4 ounces of a sweet drink (including fruit juice). For purposes of managing your carb intake, we are defining a carb food as any of the starchy foods (bread, rice, pasta, potatoes, cereal, all baked goods, crackers, ....) and sugary foods (desserts, candies, sweet drinks, including juice).   At meals where you truly don't want a carb food, then at least be sure to include a piece of fruit.  (Caution with these  high-glycemic fruits [use small quantities]: bananas and other tropical fruit, grapes, oranges, watermelon).    2. Include at every lunch and dinner: a protein food, a carb food, and vegetables.   - An ideal lunch or dinner includes twice the volume of veg's as protein or carbohydrate foods.  - Fresh or frozen vegetables are best.   - Keep frozen vegetables on hand for a quick option.    - If having canned soup, first microwave frozen vegetables, then add the soup, and reheat.    - In encourage you to broaden your vegetable repertoire.  Just experiment!  You may want to make a list of vegetables you are willing to try, and explore ways to prepare them.    Follow-up: Thursday, December 16 at 1:30 PM.

## 2020-10-06 ENCOUNTER — Other Ambulatory Visit: Payer: Self-pay | Admitting: Physician Assistant

## 2020-10-06 ENCOUNTER — Other Ambulatory Visit: Payer: Self-pay

## 2020-10-06 ENCOUNTER — Telehealth: Payer: Self-pay | Admitting: Physician Assistant

## 2020-10-06 DIAGNOSIS — M545 Low back pain, unspecified: Secondary | ICD-10-CM

## 2020-10-06 MED ORDER — HYDROCODONE-ACETAMINOPHEN 5-325 MG PO TABS: 1.0000 | ORAL_TABLET | Freq: Three times a day (TID) | ORAL | 0 refills | Status: DC | PRN

## 2020-10-06 NOTE — Telephone Encounter (Signed)
Have her stop the tramadol.  I have called in norco to take in place.  Lets go ahead and get mri lumbar spine as well

## 2020-10-06 NOTE — Telephone Encounter (Signed)
Pt called wanting to know what she can take in between her prednisone doses as she still has some pain after taking the prednisone. Pt would like a CB to further discuss   [phone]

## 2020-10-06 NOTE — Telephone Encounter (Signed)
I called pt to advise of the message below and order for MRI is in chart.

## 2020-10-06 NOTE — Telephone Encounter (Signed)
There is a message sent to lindsey about this pt already. Will sign off on this message.

## 2020-10-06 NOTE — Telephone Encounter (Signed)
I called pt and she wants to have a "medication given to help bridge the pain between her night time dose of medication and morning dose of medication" she states that this is not for her knee pain but for her back pain. She has been given prednisone, tramadol, diclofenac and none of these medications the pt states are helping with the pain. I advised that I would send a message to you to see what the next option would be. She states that she is sleep deprived and unable to sleep at all because the pain is so significant.

## 2020-10-07 DIAGNOSIS — M545 Low back pain, unspecified: Secondary | ICD-10-CM | POA: Diagnosis not present

## 2020-10-11 ENCOUNTER — Ambulatory Visit (INDEPENDENT_AMBULATORY_CARE_PROVIDER_SITE_OTHER): Payer: Medicare HMO | Admitting: Orthopedic Surgery

## 2020-10-11 ENCOUNTER — Encounter: Payer: Self-pay | Admitting: Orthopedic Surgery

## 2020-10-11 ENCOUNTER — Other Ambulatory Visit: Payer: Self-pay

## 2020-10-11 VITALS — Ht 62.0 in | Wt 231.0 lb

## 2020-10-11 DIAGNOSIS — M545 Low back pain, unspecified: Secondary | ICD-10-CM | POA: Diagnosis not present
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
## 2020-10-19 ENCOUNTER — Other Ambulatory Visit: Payer: Self-pay | Admitting: Physician Assistant

## 2020-10-19 DIAGNOSIS — M9901 Segmental and somatic dysfunction of cervical region: Secondary | ICD-10-CM | POA: Diagnosis not present

## 2020-10-19 DIAGNOSIS — F410 Panic disorder [episodic paroxysmal anxiety] without agoraphobia: Secondary | ICD-10-CM | POA: Diagnosis not present

## 2020-10-19 DIAGNOSIS — F332 Major depressive disorder, recurrent severe without psychotic features: Secondary | ICD-10-CM | POA: Diagnosis not present

## 2020-10-19 DIAGNOSIS — F411 Generalized anxiety disorder: Secondary | ICD-10-CM | POA: Diagnosis not present

## 2020-10-19 DIAGNOSIS — M546 Pain in thoracic spine: Secondary | ICD-10-CM | POA: Diagnosis not present

## 2020-10-19 DIAGNOSIS — R42 Dizziness and giddiness: Secondary | ICD-10-CM | POA: Diagnosis not present

## 2020-10-19 DIAGNOSIS — M9903 Segmental and somatic dysfunction of lumbar region: Secondary | ICD-10-CM | POA: Diagnosis not present

## 2020-10-19 DIAGNOSIS — M9902 Segmental and somatic dysfunction of thoracic region: Secondary | ICD-10-CM | POA: Diagnosis not present

## 2020-10-19 DIAGNOSIS — M542 Cervicalgia: Secondary | ICD-10-CM | POA: Diagnosis not present

## 2020-10-19 DIAGNOSIS — M5451 Vertebrogenic low back pain: Secondary | ICD-10-CM | POA: Diagnosis not present

## 2020-10-19 DIAGNOSIS — R69 Illness, unspecified: Secondary | ICD-10-CM | POA: Diagnosis not present

## 2020-10-19 DIAGNOSIS — M62830 Muscle spasm of back: Secondary | ICD-10-CM | POA: Diagnosis not present

## 2020-10-20 ENCOUNTER — Telehealth: Payer: Self-pay | Admitting: Orthopaedic Surgery

## 2020-10-20 DIAGNOSIS — M9901 Segmental and somatic dysfunction of cervical region: Secondary | ICD-10-CM | POA: Diagnosis not present

## 2020-10-20 DIAGNOSIS — M9903 Segmental and somatic dysfunction of lumbar region: Secondary | ICD-10-CM | POA: Diagnosis not present

## 2020-10-20 DIAGNOSIS — M9902 Segmental and somatic dysfunction of thoracic region: Secondary | ICD-10-CM | POA: Diagnosis not present

## 2020-10-20 NOTE — Telephone Encounter (Signed)
Received vm from pt. She is requesting her back & knee xrays taken last month. Is hoping to be able to get today. Callback [phone]

## 2020-10-20 NOTE — Telephone Encounter (Signed)
Called patient and advised that CD was ready for pickup at front desk.

## 2020-10-21 ENCOUNTER — Telehealth: Payer: Self-pay

## 2020-10-21 DIAGNOSIS — M9902 Segmental and somatic dysfunction of thoracic region: Secondary | ICD-10-CM | POA: Diagnosis not present

## 2020-10-21 DIAGNOSIS — M9901 Segmental and somatic dysfunction of cervical region: Secondary | ICD-10-CM | POA: Diagnosis not present

## 2020-10-21 DIAGNOSIS — M9903 Segmental and somatic dysfunction of lumbar region: Secondary | ICD-10-CM | POA: Diagnosis not present

## 2020-10-21 NOTE — Telephone Encounter (Signed)
Patient called stating that she is having more left knee pain.  Would like an appointment to be seen before the weekend.  Cb# [phone].  Please advise.  Thank you.

## 2020-10-22 DIAGNOSIS — M9901 Segmental and somatic dysfunction of cervical region: Secondary | ICD-10-CM | POA: Diagnosis not present

## 2020-10-22 DIAGNOSIS — M9902 Segmental and somatic dysfunction of thoracic region: Secondary | ICD-10-CM | POA: Diagnosis not present

## 2020-10-22 DIAGNOSIS — M9903 Segmental and somatic dysfunction of lumbar region: Secondary | ICD-10-CM | POA: Diagnosis not present

## 2020-10-22 NOTE — Telephone Encounter (Signed)
See message below. Her appt is 10/26/2020. Requests to be seen sooner.

## 2020-10-22 NOTE — Telephone Encounter (Signed)
Ok to get her in to see someone else if needed before weekend

## 2020-10-22 NOTE — Telephone Encounter (Signed)
Can you please call her to let her know she can see someone else per her request-needs sooner appt.

## 2020-10-25 DIAGNOSIS — M9901 Segmental and somatic dysfunction of cervical region: Secondary | ICD-10-CM | POA: Diagnosis not present

## 2020-10-25 DIAGNOSIS — M9903 Segmental and somatic dysfunction of lumbar region: Secondary | ICD-10-CM | POA: Diagnosis not present

## 2020-10-25 DIAGNOSIS — M9902 Segmental and somatic dysfunction of thoracic region: Secondary | ICD-10-CM | POA: Diagnosis not present

## 2020-10-26 ENCOUNTER — Ambulatory Visit (INDEPENDENT_AMBULATORY_CARE_PROVIDER_SITE_OTHER): Payer: Medicare HMO | Admitting: Orthopaedic Surgery

## 2020-10-26 ENCOUNTER — Ambulatory Visit: Payer: Medicare HMO | Admitting: Orthopaedic Surgery

## 2020-10-26 ENCOUNTER — Other Ambulatory Visit: Payer: Self-pay

## 2020-10-26 ENCOUNTER — Encounter: Payer: Self-pay | Admitting: Orthopaedic Surgery

## 2020-10-26 ENCOUNTER — Other Ambulatory Visit: Payer: Self-pay | Admitting: Orthopaedic Surgery

## 2020-10-26 DIAGNOSIS — G8929 Other chronic pain: Secondary | ICD-10-CM | POA: Diagnosis not present

## 2020-10-26 DIAGNOSIS — M545 Low back pain, unspecified: Secondary | ICD-10-CM | POA: Diagnosis not present

## 2020-10-26 DIAGNOSIS — M25562 Pain in left knee: Secondary | ICD-10-CM | POA: Diagnosis not present

## 2020-10-26 DIAGNOSIS — M9903 Segmental and somatic dysfunction of lumbar region: Secondary | ICD-10-CM | POA: Diagnosis not present

## 2020-10-26 DIAGNOSIS — M9901 Segmental and somatic dysfunction of cervical region: Secondary | ICD-10-CM | POA: Diagnosis not present

## 2020-10-26 DIAGNOSIS — M9902 Segmental and somatic dysfunction of thoracic region: Secondary | ICD-10-CM | POA: Diagnosis not present
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
## 2020-10-27 DIAGNOSIS — M9901 Segmental and somatic dysfunction of cervical region: Secondary | ICD-10-CM | POA: Diagnosis not present

## 2020-10-27 DIAGNOSIS — M9902 Segmental and somatic dysfunction of thoracic region: Secondary | ICD-10-CM | POA: Diagnosis not present

## 2020-10-27 DIAGNOSIS — M9903 Segmental and somatic dysfunction of lumbar region: Secondary | ICD-10-CM | POA: Diagnosis not present

## 2020-10-28 DIAGNOSIS — M9902 Segmental and somatic dysfunction of thoracic region: Secondary | ICD-10-CM | POA: Diagnosis not present

## 2020-10-28 DIAGNOSIS — M9903 Segmental and somatic dysfunction of lumbar region: Secondary | ICD-10-CM | POA: Diagnosis not present

## 2020-10-28 DIAGNOSIS — M9901 Segmental and somatic dysfunction of cervical region: Secondary | ICD-10-CM | POA: Diagnosis not present

## 2020-10-29 DIAGNOSIS — M9903 Segmental and somatic dysfunction of lumbar region: Secondary | ICD-10-CM | POA: Diagnosis not present

## 2020-10-29 DIAGNOSIS — M9901 Segmental and somatic dysfunction of cervical region: Secondary | ICD-10-CM | POA: Diagnosis not present

## 2020-10-29 DIAGNOSIS — M9902 Segmental and somatic dysfunction of thoracic region: Secondary | ICD-10-CM | POA: Diagnosis not present

## 2020-10-31 ENCOUNTER — Ambulatory Visit
Admission: RE | Admit: 2020-10-31 | Discharge: 2020-10-31 | Disposition: A | Discharge status: Home / Self Care | Payer: Medicare HMO | Source: Ambulatory Visit | Attending: Orthopaedic Surgery | Admitting: Orthopaedic Surgery

## 2020-10-31 ENCOUNTER — Other Ambulatory Visit: Payer: Self-pay

## 2020-10-31 DIAGNOSIS — M545 Low back pain, unspecified: Secondary | ICD-10-CM

## 2020-10-31 DIAGNOSIS — M48061 Spinal stenosis, lumbar region without neurogenic claudication: Secondary | ICD-10-CM | POA: Diagnosis not present

## 2020-11-01 ENCOUNTER — Ambulatory Visit: Payer: Medicare HMO | Attending: Physician Assistant | Admitting: Physical Therapy

## 2020-11-01 DIAGNOSIS — M9902 Segmental and somatic dysfunction of thoracic region: Secondary | ICD-10-CM | POA: Diagnosis not present

## 2020-11-01 DIAGNOSIS — M9903 Segmental and somatic dysfunction of lumbar region: Secondary | ICD-10-CM | POA: Diagnosis not present

## 2020-11-01 DIAGNOSIS — M9901 Segmental and somatic dysfunction of cervical region: Secondary | ICD-10-CM | POA: Diagnosis not present

## 2020-11-02 DIAGNOSIS — M9901 Segmental and somatic dysfunction of cervical region: Secondary | ICD-10-CM | POA: Diagnosis not present

## 2020-11-02 DIAGNOSIS — M9903 Segmental and somatic dysfunction of lumbar region: Secondary | ICD-10-CM | POA: Diagnosis not present

## 2020-11-02 DIAGNOSIS — M9902 Segmental and somatic dysfunction of thoracic region: Secondary | ICD-10-CM | POA: Diagnosis not present

## 2020-11-04 ENCOUNTER — Ambulatory Visit: Payer: Medicare HMO | Admitting: Family Medicine
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
## 2020-11-09 ENCOUNTER — Other Ambulatory Visit: Payer: Self-pay

## 2020-11-09 ENCOUNTER — Ambulatory Visit: Payer: Medicare HMO | Admitting: Orthopaedic Surgery

## 2020-11-09 ENCOUNTER — Encounter: Payer: Self-pay | Admitting: Orthopaedic Surgery

## 2020-11-09 DIAGNOSIS — M48061 Spinal stenosis, lumbar region without neurogenic claudication: Secondary | ICD-10-CM

## 2020-11-09 NOTE — Progress Notes (Addendum)
Office Visit Note   Patient: Kayla Marshall           Date of Birth: [birth year]           MRN: [id number] Visit Date: 11/09/2020              Requested by: Harlan Stains, MD [address],  [place] [id number] PCP: Harlan Stains, MD   Assessment & Plan: Visit Diagnoses:  1. Spinal stenosis of lumbar region without neurogenic claudication     Plan: MRI of the lumbar spine shows a significant stenosis at L4-5.  These findings were reviewed with the patient and she agreed to a referral to Dr. Ernestina Patches for consideration of lumbar spine ESI.  I personally believe that the patient would benefit from an epidural steroid injection as she has tried multiple modalities to include steroid tapers, oral NSAIDs and physical therapy all without relief of symptoms.  Questions encouraged and answered.  Follow-up as needed.  Follow-Up Instructions: Return if symptoms worsen or fail to improve.   Orders:  No orders of the defined types were placed in this encounter.  No orders of the defined types were placed in this encounter.     Procedures: No procedures performed   Clinical Data: No additional findings.   Subjective: Chief Complaint  Patient presents with  . [name] returns today for MRI review of the lumbar spine.  She decided not to go through with the left knee MRI because the knee is feeling better and that she did not want to pay the co-pay.   Review of Systems  Constitutional: Negative.   HENT: Negative.   Eyes: Negative.   Respiratory: Negative.   Cardiovascular: Negative.   Endocrine: Negative.   Musculoskeletal: Negative.   Neurological: Negative.   Hematological: Negative.   Psychiatric/Behavioral: Negative.   All other systems reviewed and are negative.    Objective: Vital Signs: There were no vitals taken for this visit.  Physical Exam Vitals and nursing note reviewed.  Constitutional:      Appearance: She is  well-developed and well-nourished.  Pulmonary:     Effort: Pulmonary effort is normal.  Skin:    General: Skin is warm.     Capillary Refill: Capillary refill takes less than 2 seconds.  Neurological:     Mental Status: She is alert and oriented to person, place, and time.  Psychiatric:        Mood and Affect: Mood and affect normal.        Behavior: Behavior normal.        Thought Content: Thought content normal.        Judgment: Judgment normal.     Ortho Exam Exam is unchanged. Specialty Comments:  No specialty comments available.  Imaging: No results found.   PMFS History: Patient Active Problem List   Diagnosis Date Noted  . Diabetic peripheral neuropathy ([place]) 01/06/2020  . Pain of neck with recent traumatic injury 12/12/2019  . Deviated septum 10/01/2019  . Left hip pain 02/13/2019  . Chronic pain of left knee 07/17/2018  . Morbid obesity ([place]) 07/17/2018  . Body mass index 40.0-44.9, adult ([place]) 07/17/2018  . Lumbar spondylosis 04/24/2018  . Allergic rhinitis 04/24/2018  . Anxiety disorder 04/24/2018  . Digital mucous cyst of finger of left hand 10/08/2017  . Acute medial meniscus tear of left knee 07/30/2017  . OSA on CPAP 08/26/2015  . Essential (primary) hypertension 04/05/2015  .  Palpitation 08/21/2012  . Unspecified hypothyroidism 08/21/2012   Past Medical History:  Diagnosis Date  . Allergies   . Anemia   . Anxiety   . Arthritis    back  . B12 deficiency   . Back pain   . Chewing difficulty   . Cold intolerance   . Depression   . Diabetic peripheral neuropathy ([place]) 01/06/2020  . Dry mouth   . Fatigue   . Gallbladder disease   . GERD (gastroesophageal reflux disease)   . Glaucoma   . Hay fever   . Headache   . Hoarseness   . Hypertension   . Hypothyroidism   . Irritable bowel   . Joint pain   . Lactose intolerance   . Leg cramps   . Muscle stiffness   . Nervousness   . Obesity   . Palpitations   . PPH (postpartum hemorrhage)    . Prediabetes   . Sciatic pain   . Sciatica   . Shortness of breath dyspnea   . Sleep apnea   . Sleep apnea   . Stress   . Swelling of lower extremity   . Tinnitus   . Torn meniscus    left  . Vitamin D deficiency   . Weakness     Family History  Problem Relation Age of Onset  . COPD Mother   . Stroke Mother   . Hypertension Mother   . Anxiety disorder Mother   . COPD Father   . Heart disease Father   . Depression Father   . Cancer Sister   . Breast cancer Sister     Past Surgical History:  Procedure Laterality Date  . CHOLECYSTECTOMY  1990  . DILATATION & CURETTAGE/HYSTEROSCOPY WITH MYOSURE N/A 11/12/2015   Procedure: DILATATION & CURETTAGE/HYSTEROSCOPY WITH  MYOSURE;  Surgeon: Thurnell Lose, MD;  Location: [facility];  Service: Gynecology;  Laterality: N/A;   Social History   Occupational History  . Occupation: Presenter, broadcasting  Tobacco Use  . Smoking status: Never Smoker  . Smokeless tobacco: Never Used  Substance and Sexual Activity  . Alcohol use: Yes    Comment: occas  . Drug use: No  . Sexual activity: Yes    Birth control/protection: Post-menopausal

## 2020-11-10 ENCOUNTER — Other Ambulatory Visit: Payer: Self-pay

## 2020-11-10 ENCOUNTER — Ambulatory Visit: Payer: Medicare HMO | Admitting: Orthopaedic Surgery

## 2020-11-10 DIAGNOSIS — M48061 Spinal stenosis, lumbar region without neurogenic claudication: Secondary | ICD-10-CM

## 2020-11-10 DIAGNOSIS — G4733 Obstructive sleep apnea (adult) (pediatric): Secondary | ICD-10-CM | POA: Diagnosis not present

## 2020-11-11 ENCOUNTER — Other Ambulatory Visit: Payer: Self-pay

## 2020-11-11 ENCOUNTER — Ambulatory Visit (INDEPENDENT_AMBULATORY_CARE_PROVIDER_SITE_OTHER): Payer: Medicare HMO | Admitting: Family Medicine

## 2020-11-11 DIAGNOSIS — E1140 Type 2 diabetes mellitus with diabetic neuropathy, unspecified: Secondary | ICD-10-CM | POA: Diagnosis not present
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
## 2020-11-11 NOTE — Patient Instructions (Addendum)
-   If you get an abnormally high or low BG reading, write down what, how much and what time you last ate.    - Call Dr. Orest Dikes office to ask about the possibility of re-calibrating your glucometer.    - Although you cannot exercise yet, consider any type of movement you can do.  For example, you can do an IT consultant for "Chair Exercises," some of which may be possible for you.  Physical activity will be helpful to you in so many ways, even including managing anxiety.    - Increasing fruits and vegetables may be especially beneficial for you b/c of their anti-inflammatory properties as well as association with better blood sugar control.      Goals:  1. Complete the dinners portion of the Meal Planning Form provided today.       - Meals should be relatively quick and easy to prepare.       - Include some vegetables you may not be familiar with, but are willing to try.       - Use your meal plan as a basis for shopping, so you can make these meals any time.      - Email your meals list to [email or web address] prior to your next appointment. (If you have difficulty coming up with meal ideas, bring what you have, and we can work on this at your follow-up appointment.)   2. Obtain nutritional balance at each meal while limiting carbohydrate.  Make sure you include  a protein food and vegetables at both lunch and dinner.  3. Continue to check fasting BG only (after checking glucometer for accuracy).   Follow-up: In-office visit:  I will email you some appointment options.

## 2020-11-16 DIAGNOSIS — R69 Illness, unspecified: Secondary | ICD-10-CM | POA: Diagnosis not present

## 2020-11-17 DIAGNOSIS — F411 Generalized anxiety disorder: Secondary | ICD-10-CM | POA: Diagnosis not present

## 2020-11-17 DIAGNOSIS — F410 Panic disorder [episodic paroxysmal anxiety] without agoraphobia: Secondary | ICD-10-CM | POA: Diagnosis not present

## 2020-11-17 DIAGNOSIS — F332 Major depressive disorder, recurrent severe without psychotic features: Secondary | ICD-10-CM | POA: Diagnosis not present

## 2020-11-17 DIAGNOSIS — R69 Illness, unspecified: Secondary | ICD-10-CM | POA: Diagnosis not present

## 2020-11-23 ENCOUNTER — Telehealth: Payer: Self-pay

## 2020-11-23 NOTE — Telephone Encounter (Signed)
Received notification patients insurance denied ESI and suggested that we could submit additional clinical information which Shena, Dr Mauro Kaufmann assistant had already done as well as initiate a P2P.   I tried calling to initiate and spoke with Florene Glen who advised patients insurance will not accept P2P and at this point will need an appeal directly through the patients helathcare plan. She advised that this could be initiated by provider or by the patient. Dr Roda Shutters, please advise how you wish to proceed at this point? Do you want to appeal or have patient reach out to her insurance?

## 2020-11-23 NOTE — Telephone Encounter (Signed)
What do we need to do for an appeal?

## 2020-11-23 NOTE — Telephone Encounter (Signed)
If you could dictate an additional note we can submit to them for appeal

## 2020-11-23 NOTE — Telephone Encounter (Signed)
Is there anything specifically that I need to include in the dictation?

## 2020-11-24 NOTE — Telephone Encounter (Signed)
Nothing specifically other than why you feel patient would benefit from [facility]

## 2020-11-25 NOTE — Telephone Encounter (Signed)
That will be fine. 

## 2020-11-25 NOTE — Telephone Encounter (Signed)
Sure.  Can I make an addendum to the 12/21 note?

## 2020-11-25 NOTE — Telephone Encounter (Signed)
Ok sounds good.  Can you see if Kayla Marshall would mind doing it?  Thank you.

## 2020-11-25 NOTE — Telephone Encounter (Signed)
See below

## 2020-11-26 NOTE — Telephone Encounter (Signed)
Can you please submit addendum to insurance for ESI? Thanks.

## 2020-11-26 NOTE — Telephone Encounter (Signed)
Ok, done 

## 2020-11-30 NOTE — Telephone Encounter (Signed)
Extra Clinic Notes sent, Pt insurance still PENDING.

## 2020-12-03 NOTE — Telephone Encounter (Signed)
Had to resubmit Auth# PENDING.

## 2020-12-11 DIAGNOSIS — G4733 Obstructive sleep apnea (adult) (pediatric): Secondary | ICD-10-CM | POA: Diagnosis not present

## 2020-12-15 NOTE — Telephone Encounter (Signed)
Submit another requested if denied pt needs to come in for OV.

## 2020-12-16 NOTE — Telephone Encounter (Signed)
I have put in another request for pt insurance with all new notes that was sent before. If I cant get this this new request approve, than she needs an OV with [facility].

## 2020-12-22 DIAGNOSIS — I868 Varicose veins of other specified sites: Secondary | ICD-10-CM | POA: Diagnosis not present

## 2020-12-22 DIAGNOSIS — M47816 Spondylosis without myelopathy or radiculopathy, lumbar region: Secondary | ICD-10-CM | POA: Diagnosis not present

## 2020-12-22 DIAGNOSIS — R69 Illness, unspecified: Secondary | ICD-10-CM | POA: Diagnosis not present

## 2020-12-22 DIAGNOSIS — I10 Essential (primary) hypertension: Secondary | ICD-10-CM | POA: Diagnosis not present

## 2020-12-22 DIAGNOSIS — E1169 Type 2 diabetes mellitus with other specified complication: Secondary | ICD-10-CM | POA: Diagnosis not present

## 2020-12-24 ENCOUNTER — Telehealth: Payer: Self-pay | Admitting: Physical Medicine and Rehabilitation

## 2020-12-24 NOTE — Telephone Encounter (Signed)
Pt states she missed a call to set an appt but she will be near the phone the rest of the day if our office would try her again.   [phone]

## 2020-12-24 NOTE — Telephone Encounter (Signed)
Called pt and sch OV-2/16

## 2021-01-05 ENCOUNTER — Encounter: Payer: Self-pay | Admitting: Physical Medicine and Rehabilitation

## 2021-01-05 ENCOUNTER — Ambulatory Visit (INDEPENDENT_AMBULATORY_CARE_PROVIDER_SITE_OTHER): Payer: Medicare HMO | Admitting: Physical Medicine and Rehabilitation

## 2021-01-05 ENCOUNTER — Other Ambulatory Visit: Payer: Self-pay

## 2021-01-05 VITALS — BP 135/75 | HR 72

## 2021-01-05 DIAGNOSIS — G8929 Other chronic pain: Secondary | ICD-10-CM

## 2021-01-05 DIAGNOSIS — M5442 Lumbago with sciatica, left side: Secondary | ICD-10-CM | POA: Diagnosis not present

## 2021-01-05 DIAGNOSIS — M5416 Radiculopathy, lumbar region: Secondary | ICD-10-CM | POA: Diagnosis not present

## 2021-01-05 DIAGNOSIS — M48062 Spinal stenosis, lumbar region with neurogenic claudication: Secondary | ICD-10-CM

## 2021-01-05 DIAGNOSIS — M47816 Spondylosis without myelopathy or radiculopathy, lumbar region: Secondary | ICD-10-CM

## 2021-01-05 DIAGNOSIS — M5441 Lumbago with sciatica, right side: Secondary | ICD-10-CM

## 2021-01-05 NOTE — Progress Notes (Signed)
States pain in right and left piriformis (per patient) and pain goes around to front- groin area on both sides (patient states psoas). Tries to walk every day but this sometimes increases pain. Bending, cleaning increase pain. Going up and down stairs a lot also increases pain. Tightness in low back.  Numeric Pain Rating Scale and Functional Assessment Average Pain 6 Pain Right Now 5 My pain is constant, dull and aching Pain is worse with: walking, bending and some activites Pain improves with: heat/ice and medication   In the last MONTH (on 0-10 scale) has pain interfered with the following?  1. General activity like being  able to carry out your everyday physical activities such as walking, climbing stairs, carrying groceries, or moving a chair?  Rating(9)  2. Relation with others like being able to carry out your usual social activities and roles such as  activities at home, at work and in your community. Rating(9)  3. Enjoyment of life such that you have  been bothered by emotional problems such as feeling anxious, depressed or irritable?  Rating(9)

## 2021-01-05 NOTE — Progress Notes (Signed)
Kayla Marshall - 70 y.o. female MRN [id number]  Date of birth: [birth year]  Office Visit Note: Visit Date: 01/05/2021 PCP: Harlan Stains, MD Referred by: Harlan Stains, MD  Subjective: Chief Complaint  Patient presents with   Lower Back - Pain   HPI: Kayla Marshall is a 70 y.o. female who comes in today At the request of Dr. Eduard Roux for evaluation and management of continued low back pain and bilateral hip and thigh and leg pain.  The patient has been seen extensively by Dr. Georgeanna Lea for chiropractic care.  She reports that she has piriformis syndrome bilaterally with pain going from the posterior buttock area to the front.  She tells me that her psoas muscles are likely source of the groin pain.  She has been followed by Dr. Eduard Roux and his assistant Tawanna Cooler, PA-C quite extensively.  She has had chiropractic care and medication management including small dose of opioids without much relief overall.  She continues chiropractic care.  She reports 6 out of 10 pain with worsening with bending and cleaning and standing and bending.  She reports a constant pain overall.  She gets mild relief temporarily with heat ice and medication and chiropractic treatment.  Because she had failed conservative care with medications including opioid and prednisone as well as the chiropractic care MRI of the lumbar spine was obtained.  This is reviewed today.  This is reviewed with spine models and imaging and reviewed with her extensively.  Her case is complicated by history of obesity and diabetes with diabetic peripheral neuropathy diagnosis.  No history of lumbar injections.  No history of lumbar surgery.  No red flag complaints otherwise.  Review of Systems  Musculoskeletal:  Positive for back pain and joint pain.  Neurological:  Positive for tingling.  All other systems reviewed and are negative. Otherwise per HPI.  Assessment & Plan: Visit Diagnoses:    ICD-10-CM   1. Spinal stenosis of lumbar  region with neurogenic claudication  M48.062     2. Lumbar radiculopathy  M54.16     3. Spondylosis without myelopathy or radiculopathy, lumbar region  M47.816     4. Chronic bilateral low back pain with bilateral sciatica  M54.42    M54.41    G89.29        Plan: Findings:  Chronic worsening severe low back pain with bilateral hip and thigh pain consistent mainly in our view with lumbar stenosis which is moderate with lateral recess narrowing and neurogenic type claudication.  Some of her back pain is likely facet joint mediated back pain.  Underlying this is likely myofascial pain syndrome with some level of mechanical complaints including possibly piriformis syndrome that her chiropractor has been working with.  We discussed injections in terms of how we do those the safety of the injections itself and the goals.  Patient does want to review this before having any procedure.  She will continue with current conservative care.  Plan would be if injected would be Bilateral L4 transforaminal epidural steroid injection.   Meds & Orders: No orders of the defined types were placed in this encounter.  No orders of the defined types were placed in this encounter.   Follow-up: Return if symptoms worsen or fail to improve.   Procedures: No procedures performed      Clinical History: MRI LUMBAR SPINE WITHOUT CONTRAST   TECHNIQUE: Multiplanar, multisequence MR imaging of the lumbar spine was performed. No intravenous contrast was administered.  COMPARISON: MRI of the lumbar spine December 14, 2014.   FINDINGS: Segmentation: Standard.   Alignment: Small anterolisthesis of L4 over L5, new from prior MRI.   Vertebrae: No fracture, evidence of discitis, or bone lesion.   Conus medullaris and cauda equina: Conus extends to the T12 level. Conus and cauda equina appear normal.   Paraspinal and other soft tissues: Negative.   Disc levels:   T12-L1: Shallow disc bulge and mild facet  degenerative changes. No spinal or neural foraminal stenosis.   L1-2: Left central disc protrusion and mild facet degenerative changes without significant spinal canal or neural foraminal stenosis.   L2-3: Shallow disc bulge and mild facet degenerative changes without significant spinal canal or neural foraminal stenosis.   L3-4: Shallow disc bulge and moderate facet degenerative changes without significant spinal canal or neural foraminal stenosis.   L4-5: Disc bulge, advanced hypertrophic facet degenerative changes and ligamentum flavum redundancy resulting in moderate spinal canal stenosis with narrowing of the bilateral subarticular zones and mild bilateral neural foraminal narrowing. Spinal canal stenosis has progressed since prior MRI.   L5-S1: Facet degenerative changes. No spinal canal or neural foraminal stenosis.   Perineural cyst at S1 on the right.   IMPRESSION: Multilevel degenerative changes of the lumbar spine, worst at L4-L5 where there is moderate spinal canal stenosis, narrowing of the bilateral subarticular zones, and mild bilateral neural foraminal narrowing, progressed since prior MRI.     Electronically Signed By: Pedro Earls M.D. On: 10/31/2020 22:01 ----- Electrodiagnostic study IMPRESSION:   Nerve conduction studies done on both lower extremities show evidence of a sensorimotor peripheral neuropathy of moderate severity, possibly related to the borderline diabetes.  EMG evaluation of the right lower extremity shows mild distal chronic neuropathic denervation consistent with the diagnosis of peripheral neuropathy, no clear evidence of an overlying lumbosacral radiculopathy was seen.    Jill Alexanders MD 01/06/2020 3:49 PM   She reports that she has never smoked. She has never used smokeless tobacco. No results for input(s): HGBA1C, LABURIC in the last 8760 hours.  Objective:  VS:  HT:     WT:    BMI:      BP:135/75   HR:72bpm    TEMP: ( )   RESP:  Physical Exam Vitals and nursing note reviewed.  Constitutional:      General: She is not in acute distress.    Appearance: Normal appearance. She is obese. She is not ill-appearing.  HENT:     Head: Normocephalic and atraumatic.     Right Ear: External ear normal.     Left Ear: External ear normal.  Eyes:     Extraocular Movements: Extraocular movements intact.  Cardiovascular:     Rate and Rhythm: Normal rate.     Pulses: Normal pulses.  Pulmonary:     Effort: Pulmonary effort is normal. No respiratory distress.  Abdominal:     General: There is no distension.     Palpations: Abdomen is soft.  Musculoskeletal:        General: Tenderness present.     Cervical back: Neck supple.     Right lower leg: No edema.     Left lower leg: No edema.     Comments: Patient has good distal strength with no pain over the greater trochanters.  No clonus or focal weakness. Patient somewhat slow to rise from a seated position to full extension.  There is concordant low back pain with facet loading and lumbar spine  extension rotation.  There are no definitive trigger points but the patient is somewhat tender across the lower back and PSIS.  There is no pain with hip rotation.   Skin:    Findings: No erythema, lesion or rash.  Neurological:     General: No focal deficit present.     Mental Status: She is alert and oriented to person, place, and time.     Sensory: No sensory deficit.     Motor: No weakness or abnormal muscle tone.     Coordination: Coordination normal.  Psychiatric:        Mood and Affect: Mood normal.        Behavior: Behavior normal.    Ortho Exam  Imaging: No results found.  Past Medical/Family/Surgical/Social History: Medications & Allergies reviewed per EMR, new medications updated. Patient Active Problem List   Diagnosis Date Noted   Diabetic peripheral neuropathy ([place]) 01/06/2020   Pain of neck with recent traumatic injury 12/12/2019   Deviated  septum 10/01/2019   Left hip pain 02/13/2019   Chronic pain of left knee 07/17/2018   Morbid obesity ([place]) 07/17/2018   Body mass index 40.0-44.9, adult ([place]) 07/17/2018   Lumbar spondylosis 04/24/2018   Allergic rhinitis 04/24/2018   Anxiety disorder 04/24/2018   Digital mucous cyst of finger of left hand 10/08/2017   Acute medial meniscus tear of left knee 07/30/2017   OSA on CPAP 08/26/2015   Essential (primary) hypertension 04/05/2015   Palpitation 08/21/2012   Unspecified hypothyroidism 08/21/2012   Past Medical History:  Diagnosis Date   Allergies    Anemia    Anxiety    Arthritis    back   B12 deficiency    Back pain    Chewing difficulty    Cold intolerance    Depression    Diabetic peripheral neuropathy ([facility]) 01/06/2020   Dry mouth    Fatigue    Gallbladder disease    GERD (gastroesophageal reflux disease)    Glaucoma    Hay fever    Headache    Hoarseness    Hypertension    Hypothyroidism    Irritable bowel    Joint pain    Lactose intolerance    Leg cramps    Muscle stiffness    Nervousness    Obesity    Palpitations    PPH (postpartum hemorrhage)    Prediabetes    Sciatic pain    Sciatica    Shortness of breath dyspnea    Sleep apnea    Sleep apnea    Stress    Swelling of lower extremity    Tinnitus    Torn meniscus    left   Vitamin D deficiency    Weakness    Family History  Problem Relation Age of Onset   COPD Mother    Stroke Mother    Hypertension Mother    Anxiety disorder Mother    COPD Father    Heart disease Father    Depression Father    Cancer Sister    Breast cancer Sister    Past Surgical History:  Procedure Laterality Date   CHOLECYSTECTOMY  1990   DILATATION & CURETTAGE/HYSTEROSCOPY WITH MYOSURE N/A 11/12/2015   Procedure: DILATATION & CURETTAGE/HYSTEROSCOPY WITH  MYOSURE;  Surgeon: Thurnell Lose, MD;  Location: [facility];  Service: Gynecology;  Laterality: N/A;   Social History   Occupational History    Occupation: Presenter, broadcasting  Tobacco Use   Smoking status: Never   Smokeless  tobacco: Never  Substance and Sexual Activity   Alcohol use: Yes    Comment: occas   Drug use: No   Sexual activity: Yes    Birth control/protection: Post-menopausal

## 2021-01-11 DIAGNOSIS — G4733 Obstructive sleep apnea (adult) (pediatric): Secondary | ICD-10-CM | POA: Diagnosis not present

## 2021-01-12 DIAGNOSIS — I10 Essential (primary) hypertension: Secondary | ICD-10-CM | POA: Diagnosis not present

## 2021-01-12 DIAGNOSIS — R5383 Other fatigue: Secondary | ICD-10-CM | POA: Diagnosis not present

## 2021-01-19 ENCOUNTER — Ambulatory Visit (INDEPENDENT_AMBULATORY_CARE_PROVIDER_SITE_OTHER): Payer: Medicare Other | Admitting: Family Medicine

## 2021-01-19 DIAGNOSIS — E1140 Type 2 diabetes mellitus with diabetic neuropathy, unspecified: Secondary | ICD-10-CM
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
## 2021-01-19 NOTE — Patient Instructions (Signed)
Continue to use Optivia foods you have on hand, but with the following modifications to your eating plan:   1. Aim for consistent meal times (+/- 30 minutes), beginning with breakfast within one hour of getting up (8 or 8:30 AM), and last meal of the day ~7:30 PM.  Go no more than 4 hours between eating times.    2. Increase breakfast, to include a protein food, some source of starch, and possibly some (low-glycemic) fruit such as berries or apple.    3. Continue with a cooked dinner of protein food and vegetables (and limited starch, if desired).  Also plan on a salad meal for lunch that includes a protein food such as chicken, fish, or beans.    Complete the Meal Planning form provided today, email to [facility] for review and feedback.    Follow-up in-office appt on Monday, April 25 at 11 AM.

## 2021-03-08 ENCOUNTER — Telehealth: Payer: Self-pay | Admitting: Physical Medicine and Rehabilitation

## 2021-03-08 NOTE — Telephone Encounter (Signed)
Patient called needing a call back concerning the denial for ESI. The number to contact patient is [phone]

## 2021-03-09 ENCOUNTER — Telehealth: Payer: Self-pay | Admitting: Physical Medicine and Rehabilitation

## 2021-03-09 NOTE — Telephone Encounter (Signed)
Called pt and LVM #1 

## 2021-03-09 NOTE — Telephone Encounter (Signed)
Called pt and spoken with pt

## 2021-03-09 NOTE — Telephone Encounter (Signed)
Patient returned call to [facility]. Please call patient at [phone].

## 2021-03-11 DIAGNOSIS — G4733 Obstructive sleep apnea (adult) (pediatric): Secondary | ICD-10-CM | POA: Diagnosis not present

## 2021-03-14 ENCOUNTER — Ambulatory Visit: Payer: Medicare HMO | Admitting: Family Medicine

## 2021-03-14 DIAGNOSIS — H2513 Age-related nuclear cataract, bilateral: Secondary | ICD-10-CM | POA: Diagnosis not present

## 2021-03-14 DIAGNOSIS — H402231 Chronic angle-closure glaucoma, bilateral, mild stage: Secondary | ICD-10-CM | POA: Diagnosis not present

## 2021-03-14 DIAGNOSIS — H524 Presbyopia: Secondary | ICD-10-CM | POA: Diagnosis not present

## 2021-03-14 DIAGNOSIS — H25013 Cortical age-related cataract, bilateral: Secondary | ICD-10-CM | POA: Diagnosis not present

## 2021-04-05 DIAGNOSIS — R7309 Other abnormal glucose: Secondary | ICD-10-CM | POA: Diagnosis not present

## 2021-04-05 DIAGNOSIS — H2511 Age-related nuclear cataract, right eye: Secondary | ICD-10-CM | POA: Diagnosis not present

## 2021-04-05 DIAGNOSIS — H402231 Chronic angle-closure glaucoma, bilateral, mild stage: Secondary | ICD-10-CM | POA: Diagnosis not present

## 2021-04-05 DIAGNOSIS — H2513 Age-related nuclear cataract, bilateral: Secondary | ICD-10-CM | POA: Diagnosis not present

## 2021-04-05 DIAGNOSIS — H25013 Cortical age-related cataract, bilateral: Secondary | ICD-10-CM | POA: Diagnosis not present

## 2021-04-10 DIAGNOSIS — G4733 Obstructive sleep apnea (adult) (pediatric): Secondary | ICD-10-CM | POA: Diagnosis not present
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
## 2021-04-12 ENCOUNTER — Other Ambulatory Visit: Payer: Self-pay

## 2021-04-12 ENCOUNTER — Ambulatory Visit (INDEPENDENT_AMBULATORY_CARE_PROVIDER_SITE_OTHER): Payer: Medicare Other | Admitting: Family Medicine

## 2021-04-12 DIAGNOSIS — E1140 Type 2 diabetes mellitus with diabetic neuropathy, unspecified: Secondary | ICD-10-CM | POA: Diagnosis not present

## 2021-04-12 NOTE — Patient Instructions (Addendum)
GOALS:  1. Aim for consistent meal times (+/- 30 minutes), beginning with breakfast within one hour of getting up (8 or 8:30 AM), and last meal of the day ~7:30 PM.  Go no more than 4 hours between eating times.    2. Include twice the volume of vegetables as protein or carbohydrate foods for BOTH lunch and dinner as often as you can.  Carb will be optional at these meals, but be honest with yourself: If you want a starchy food, have it in an appropriate amount.    3. Limit starchy (carb) foods to ONE per meal and ONE per snack. ONE portion of a starchy food is equal to the following:   - ONE slice of bread (or its equivalent, such as half of a hamburger bun).   - 1/2 cup of a "scoopable" starchy food such as potatoes or rice.   - 15 grams of Total Carbohydrate as shown on food label.   - Complete the Meal Planning form provided previously, email to [facility] for review and feedback.    Meal and snack ideas:  Add any fresh or frozen veg's to canned soup.   If using dried beans, peas, or lentils, consider a serving size to be at least 1 full cup.   Keep some sources of protein foods on hand, such as chicken, canned tuna, beans, and lentils.   Fruits: The best (low-glycemic) fruits include any berries, cherries, plums, peaches, apples (honey crisp is higher-glycemic than most), cantaloupe, honey dew melon.  High-glycemic fruits include watermelon, grapes, bananas, and oranges.   Peanut butter:  Buy the oil-on-top kind, then pour off some of the oil before using (then stir). Consider 2 tbsp to be a serving.    When to know if Fasting Blood Glucose (FBG) is too high:  This depends in part on your baseline (usual) FBG.  (Ideal is ~100.)  If above 120, immediately determine what you ate last, how much, and what time.  Does that explain the high level?    Follow-up in-office appt on Tuesday, July 26 at 4 PM.

## 2021-05-11 DIAGNOSIS — G4733 Obstructive sleep apnea (adult) (pediatric): Secondary | ICD-10-CM | POA: Diagnosis not present

## 2021-06-10 DIAGNOSIS — G4733 Obstructive sleep apnea (adult) (pediatric): Secondary | ICD-10-CM | POA: Diagnosis not present

## 2021-06-14 ENCOUNTER — Ambulatory Visit: Payer: Medicare Other | Admitting: Family Medicine

## 2021-07-04 DIAGNOSIS — G4733 Obstructive sleep apnea (adult) (pediatric): Secondary | ICD-10-CM | POA: Diagnosis not present

## 2021-07-11 DIAGNOSIS — G4733 Obstructive sleep apnea (adult) (pediatric): Secondary | ICD-10-CM | POA: Diagnosis not present

## 2021-08-07 ENCOUNTER — Encounter: Payer: Self-pay | Admitting: Physical Medicine and Rehabilitation

## 2021-08-11 DIAGNOSIS — G4733 Obstructive sleep apnea (adult) (pediatric): Secondary | ICD-10-CM | POA: Diagnosis not present

## 2021-08-23 ENCOUNTER — Encounter: Payer: Self-pay | Admitting: Orthopaedic Surgery

## 2021-08-23 ENCOUNTER — Ambulatory Visit: Payer: Medicare Other | Admitting: Orthopaedic Surgery

## 2021-08-23 ENCOUNTER — Ambulatory Visit: Payer: Self-pay

## 2021-08-23 ENCOUNTER — Other Ambulatory Visit: Payer: Self-pay

## 2021-08-23 DIAGNOSIS — M25561 Pain in right knee: Secondary | ICD-10-CM | POA: Diagnosis not present

## 2021-08-23 NOTE — Progress Notes (Signed)
Office Visit Note   Patient: Kayla Marshall           Date of Birth: [birth year]           MRN: [id number] Visit Date: 08/23/2021              Requested by: Harlan Stains, MD [place],  [place] [id number] PCP: Harlan Stains, MD   Assessment & Plan: Visit Diagnoses:  1. Right knee pain, unspecified chronicity     Plan: Impression is right knee degenerative medial meniscus tear.  Today, we discussed intra-articular cortisone injection versus MRI.  She would like to try injection first.  If her symptoms have not improved over the next several weeks she will call us and we will order an MRI to assess for meniscal pathology.  Otherwise, follow-up with [place] as needed.  Follow-Up Instructions: Return if symptoms worsen or fail to improve.   Orders:  Orders Placed This Encounter  Procedures   Large Joint Inj: R knee   XR KNEE 3 VIEW RIGHT   No orders of the defined types were placed in this encounter.     Procedures: Large Joint Inj: R knee on 08/23/2021 3:52 PM Indications: pain Details: 22 G needle, anterolateral approach Medications: 2 mL lidocaine 1 %; 2 mL bupivacaine 0.25 %; 40 mg methylPREDNISolone acetate 40 MG/ML     Clinical Data: No additional findings.   Subjective: Chief Complaint  Patient presents with   Right Knee - Follow-up    HPI patient is a pleasant 70 year old female who comes in today following an injury to the right knee.  Approximately 2 weeks ago, she lost her footing going down a set of stairs twisting her right knee.  Her pain started to improve but this past Saturday she twisted her knee again.  She has had significant pain since.  Majority of her pain is to the medial aspect and is worse with pivoting, squatting and going up and down stairs.  She has been using CBD oil and taking Mobic without significant relief.  She has previously undergone right knee cortisone injection about 3 years ago which did help until this recent  injury.  Review of Systems as detailed in HPI.  All others reviewed and are negative.   Objective: Vital Signs: There were no vitals taken for this visit.  Physical Exam well-developed well-nourished female no acute distress.  Alert and oriented x3.  Ortho Exam right knee exam shows a small effusion.  Range of motion 0 to 100 degrees.  Medial greater than lateral joint line tenderness.  Mild patellofemoral crepitus.  Ligaments are stable.  She is neurovascular intact distally.  Specialty Comments:  No specialty comments available.  Imaging: XR KNEE 3 VIEW RIGHT  Result Date: 08/23/2021 X-rays demonstrate mild to moderate tricompartmental degenerative changes    PMFS History: Patient Active Problem List   Diagnosis Date Noted   Diabetic peripheral neuropathy ([place]) 01/06/2020   Pain of neck with recent traumatic injury 12/12/2019   Deviated septum 10/01/2019   Left hip pain 02/13/2019   Chronic pain of left knee 07/17/2018   Morbid obesity ([place]) 07/17/2018   Body mass index 40.0-44.9, adult ([place]) 07/17/2018   Lumbar spondylosis 04/24/2018   Allergic rhinitis 04/24/2018   Anxiety disorder 04/24/2018   Digital mucous cyst of finger of left hand 10/08/2017   Acute medial meniscus tear of left knee 07/30/2017   OSA on CPAP 08/26/2015   Essential (primary) hypertension 04/05/2015  Palpitation 08/21/2012   Unspecified hypothyroidism 08/21/2012   Past Medical History:  Diagnosis Date   Allergies    Anemia    Anxiety    Arthritis    back   B12 deficiency    Back pain    Chewing difficulty    Cold intolerance    Depression    Diabetic peripheral neuropathy (H[facility]) 01/06/2020   Dry mouth    Fatigue    Gallbladder disease    GERD (gastroesophageal reflux disease)    Glaucoma    Hay fever    Headache    Hoarseness    Hypertension    Hypothyroidism    Irritable bowel    Joint pain    Lactose intolerance    Leg cramps    Muscle stiffness    Nervousness    Obesity     Palpitations    PPH (postpartum hemorrhage)    Prediabetes    Sciatic pain    Sciatica    Shortness of breath dyspnea    Sleep apnea    Sleep apnea    Stress    Swelling of lower extremity    Tinnitus    Torn meniscus    left   Vitamin D deficiency    Weakness     Family History  Problem Relation Age of Onset   COPD Mother    Stroke Mother    Hypertension Mother    Anxiety disorder Mother    COPD Father    Heart disease Father    Depression Father    Cancer Sister    Breast cancer Sister     Past Surgical History:  Procedure Laterality Date   CHOLECYSTECTOMY  1990   DILATATION & CURETTAGE/HYSTEROSCOPY WITH MYOSURE N/A 11/12/2015   Procedure: DILATATION & CURETTAGE/HYSTEROSCOPY WITH  MYOSURE;  Surgeon: Thurnell Lose, MD;  Location: [facility];  Service: Gynecology;  Laterality: N/A;   Social History   Occupational History   Occupation: Presenter, broadcasting  Tobacco Use   Smoking status: Never   Smokeless tobacco: Never  Substance and Sexual Activity   Alcohol use: Yes    Comment: occas   Drug use: No   Sexual activity: Yes    Birth control/protection: Post-menopausal

## 2021-08-24 MED ORDER — LIDOCAINE HCL 1 % IJ SOLN
2.0000 mL | INTRAMUSCULAR | Status: AC | PRN
  Administered 2021-08-23: 2 mL

## 2021-08-24 MED ORDER — BUPIVACAINE HCL 0.25 % IJ SOLN
2.0000 mL | INTRAMUSCULAR | Status: AC | PRN
  Administered 2021-08-23: 2 mL via INTRA_ARTICULAR

## 2021-08-24 MED ORDER — METHYLPREDNISOLONE ACETATE 40 MG/ML IJ SUSP
40.0000 mg | INTRAMUSCULAR | Status: AC | PRN
  Administered 2021-08-23: 40 mg via INTRA_ARTICULAR

## 2021-08-30 DIAGNOSIS — R7309 Other abnormal glucose: Secondary | ICD-10-CM | POA: Diagnosis not present

## 2021-08-30 DIAGNOSIS — H524 Presbyopia: Secondary | ICD-10-CM | POA: Diagnosis not present

## 2021-08-30 DIAGNOSIS — H2513 Age-related nuclear cataract, bilateral: Secondary | ICD-10-CM | POA: Diagnosis not present

## 2021-08-30 DIAGNOSIS — H402231 Chronic angle-closure glaucoma, bilateral, mild stage: Secondary | ICD-10-CM | POA: Diagnosis not present

## 2021-08-30 DIAGNOSIS — H25013 Cortical age-related cataract, bilateral: Secondary | ICD-10-CM | POA: Diagnosis not present

## 2021-09-08 DIAGNOSIS — D219 Benign neoplasm of connective and other soft tissue, unspecified: Secondary | ICD-10-CM | POA: Diagnosis not present

## 2021-09-08 DIAGNOSIS — R9389 Abnormal findings on diagnostic imaging of other specified body structures: Secondary | ICD-10-CM | POA: Diagnosis not present

## 2021-09-08 DIAGNOSIS — R300 Dysuria: Secondary | ICD-10-CM | POA: Diagnosis not present

## 2021-09-10 DIAGNOSIS — G4733 Obstructive sleep apnea (adult) (pediatric): Secondary | ICD-10-CM | POA: Diagnosis not present

## 2021-09-19 DIAGNOSIS — M47816 Spondylosis without myelopathy or radiculopathy, lumbar region: Secondary | ICD-10-CM | POA: Diagnosis not present

## 2021-09-19 DIAGNOSIS — E1169 Type 2 diabetes mellitus with other specified complication: Secondary | ICD-10-CM | POA: Diagnosis not present

## 2021-09-19 DIAGNOSIS — Z23 Encounter for immunization: Secondary | ICD-10-CM | POA: Diagnosis not present

## 2021-09-19 DIAGNOSIS — I10 Essential (primary) hypertension: Secondary | ICD-10-CM | POA: Diagnosis not present

## 2021-09-20 DIAGNOSIS — H402231 Chronic angle-closure glaucoma, bilateral, mild stage: Secondary | ICD-10-CM | POA: Diagnosis not present

## 2021-09-20 DIAGNOSIS — R7309 Other abnormal glucose: Secondary | ICD-10-CM | POA: Diagnosis not present

## 2021-09-20 DIAGNOSIS — H524 Presbyopia: Secondary | ICD-10-CM | POA: Diagnosis not present

## 2021-09-20 DIAGNOSIS — H25013 Cortical age-related cataract, bilateral: Secondary | ICD-10-CM | POA: Diagnosis not present

## 2021-09-20 DIAGNOSIS — H2511 Age-related nuclear cataract, right eye: Secondary | ICD-10-CM | POA: Diagnosis not present

## 2021-09-20 DIAGNOSIS — H2513 Age-related nuclear cataract, bilateral: Secondary | ICD-10-CM | POA: Diagnosis not present

## 2021-09-27 ENCOUNTER — Other Ambulatory Visit: Payer: Self-pay

## 2021-09-27 ENCOUNTER — Ambulatory Visit (INDEPENDENT_AMBULATORY_CARE_PROVIDER_SITE_OTHER): Payer: Medicare Other | Admitting: Orthopaedic Surgery

## 2021-09-27 ENCOUNTER — Encounter: Payer: Self-pay | Admitting: Orthopaedic Surgery

## 2021-09-27 DIAGNOSIS — M25561 Pain in right knee: Secondary | ICD-10-CM | POA: Diagnosis not present

## 2021-09-27 DIAGNOSIS — G8929 Other chronic pain: Secondary | ICD-10-CM

## 2021-09-27 NOTE — Progress Notes (Signed)
Office Visit Note   Patient: Kayla Marshall           Date of Birth: August 15, 1951           MRN: [id number] Visit Date: 09/27/2021              Requested by: Harlan Stains, MD [place],  [place] [id number] PCP: Harlan Stains, MD   Assessment & Plan: Visit Diagnoses:  1. Chronic pain of right knee     Plan: Impression is right knee degenerative medial meniscus tear.  At this point, believe the patient's symptoms are coming more from meniscal pathology rather than arthritis as her symptoms are intermittent and primarily occur with twisting motions.  She has not had long-lasting relief from exercises or cortisone injection so we have discussed proceeding with MRI to assess her meniscus.  She will follow-up with [place] once this is been completed.  Call with concerns or questions in the meantime.  Follow-Up Instructions: Return for after MRI.   Orders:  No orders of the defined types were placed in this encounter.  No orders of the defined types were placed in this encounter.     Procedures: No procedures performed   Clinical Data: No additional findings.   Subjective: Chief Complaint  Patient presents with   Right Knee - Pain    HPI patient is a pleasant 70 year old female who comes in today with continued right knee pain.  This began a few months ago after twisting her knee.  She has had pain to the medial joint line since.  This primarily occurs with twisting motions.  She was seen in our office about a month ago where cortisone injection was performed.  This gave her good but temporary relief in symptoms.  She has been working on exercises over the past few months as well without additional relief in symptoms.  Review of Systems as detailed in HPI.  All others reviewed and are negative.   Objective: Vital Signs: There were no vitals taken for this visit.  Physical Exam well-developed well-nourished female in no acute distress.  Alert and oriented  x3.  Ortho Exam right knee exam shows no effusion.  Range of motion 0 to 95 degrees.  Marked tenderness medial joint line.  She is stable valgus varus stress.  She is neurovascular intact distally.  Specialty Comments:  No specialty comments available.  Imaging: No new imaging   PMFS History: Patient Active Problem List   Diagnosis Date Noted   Diabetic peripheral neuropathy ([place]) 01/06/2020   Pain of neck with recent traumatic injury 12/12/2019   Deviated septum 10/01/2019   Left hip pain 02/13/2019   Chronic pain of left knee 07/17/2018   Morbid obesity ([place]) 07/17/2018   Body mass index 40.0-44.9, adult ([place]) 07/17/2018   Lumbar spondylosis 04/24/2018   Allergic rhinitis 04/24/2018   Anxiety disorder 04/24/2018   Digital mucous cyst of finger of left hand 10/08/2017   Acute medial meniscus tear of left knee 07/30/2017   OSA on CPAP 08/26/2015   Essential (primary) hypertension 04/05/2015   Palpitation 08/21/2012   Unspecified hypothyroidism 08/21/2012   Past Medical History:  Diagnosis Date   Allergies    Anemia    Anxiety    Arthritis    back   B12 deficiency    Back pain    Chewing difficulty    Cold intolerance    Depression    Diabetic peripheral neuropathy ([place]) 01/06/2020  Dry mouth    Fatigue    Gallbladder disease    GERD (gastroesophageal reflux disease)    Glaucoma    Hay fever    Headache    Hoarseness    Hypertension    Hypothyroidism    Irritable bowel    Joint pain    Lactose intolerance    Leg cramps    Muscle stiffness    Nervousness    Obesity    Palpitations    PPH (postpartum hemorrhage)    Prediabetes    Sciatic pain    Sciatica    Shortness of breath dyspnea    Sleep apnea    Sleep apnea    Stress    Swelling of lower extremity    Tinnitus    Torn meniscus    left   Vitamin D deficiency    Weakness     Family History  Problem Relation Age of Onset   COPD Mother    Stroke Mother    Hypertension Mother    Anxiety  disorder Mother    COPD Father    Heart disease Father    Depression Father    Cancer Sister    Breast cancer Sister     Past Surgical History:  Procedure Laterality Date   CHOLECYSTECTOMY  1990   DILATATION & CURETTAGE/HYSTEROSCOPY WITH MYOSURE N/A 11/12/2015   Procedure: DILATATION & CURETTAGE/HYSTEROSCOPY WITH  MYOSURE;  Surgeon: Thurnell Lose, MD;  Location: [facility];  Service: Gynecology;  Laterality: N/A;   Social History   Occupational History   Occupation: Presenter, broadcasting  Tobacco Use   Smoking status: Never   Smokeless tobacco: Never  Substance and Sexual Activity   Alcohol use: Yes    Comment: occas   Drug use: No   Sexual activity: Yes    Birth control/protection: Post-menopausal

## 2021-09-28 ENCOUNTER — Telehealth: Payer: Self-pay | Admitting: Orthopaedic Surgery

## 2021-09-28 NOTE — Telephone Encounter (Signed)
Called and advised patient CD was ready for pickup  

## 2021-09-28 NOTE — Telephone Encounter (Signed)
Patient wants to get knee xrays on CD. Please call when ready. [phone]

## 2021-09-29 ENCOUNTER — Other Ambulatory Visit: Payer: Self-pay

## 2021-09-29 DIAGNOSIS — R9389 Abnormal findings on diagnostic imaging of other specified body structures: Secondary | ICD-10-CM | POA: Diagnosis not present

## 2021-10-20 ENCOUNTER — Other Ambulatory Visit: Payer: Self-pay

## 2021-10-20 ENCOUNTER — Ambulatory Visit
Admission: RE | Admit: 2021-10-20 | Discharge: 2021-10-20 | Disposition: A | Discharge status: Home / Self Care | Payer: Medicare Other | Source: Ambulatory Visit | Attending: Orthopaedic Surgery | Admitting: Orthopaedic Surgery

## 2021-10-20 DIAGNOSIS — G8929 Other chronic pain: Secondary | ICD-10-CM

## 2021-10-20 DIAGNOSIS — M7121 Synovial cyst of popliteal space [Baker], right knee: Secondary | ICD-10-CM | POA: Diagnosis not present

## 2021-10-20 DIAGNOSIS — S83241A Other tear of medial meniscus, current injury, right knee, initial encounter: Secondary | ICD-10-CM | POA: Diagnosis not present

## 2021-10-20 DIAGNOSIS — M25462 Effusion, left knee: Secondary | ICD-10-CM | POA: Diagnosis not present

## 2021-10-20 DIAGNOSIS — M25461 Effusion, right knee: Secondary | ICD-10-CM | POA: Diagnosis not present

## 2021-10-20 DIAGNOSIS — M25562 Pain in left knee: Secondary | ICD-10-CM

## 2021-10-20 DIAGNOSIS — S83242A Other tear of medial meniscus, current injury, left knee, initial encounter: Secondary | ICD-10-CM | POA: Diagnosis not present

## 2021-10-20 DIAGNOSIS — S83411A Sprain of medial collateral ligament of right knee, initial encounter: Secondary | ICD-10-CM | POA: Diagnosis not present

## 2021-10-25 ENCOUNTER — Ambulatory Visit: Payer: Medicare Other | Admitting: Orthopaedic Surgery

## 2021-10-25 ENCOUNTER — Other Ambulatory Visit: Payer: Self-pay

## 2021-10-25 VITALS — Ht 62.5 in | Wt 234.2 lb

## 2021-10-25 DIAGNOSIS — Z6841 Body Mass Index (BMI) 40.0 and over, adult: Secondary | ICD-10-CM

## 2021-10-25 DIAGNOSIS — M25561 Pain in right knee: Secondary | ICD-10-CM

## 2021-10-25 DIAGNOSIS — G8929 Other chronic pain: Secondary | ICD-10-CM

## 2021-10-25 DIAGNOSIS — M25562 Pain in left knee: Secondary | ICD-10-CM | POA: Diagnosis not present

## 2021-10-25 NOTE — Progress Notes (Signed)
Office Visit Note   Patient: Kayla Marshall           Date of Birth: 06/17/1951           MRN: [id number] Visit Date: 10/25/2021              Requested by: Harlan Stains, MD [place],   [id number] PCP: Harlan Stains, MD   Assessment & Plan: Visit Diagnoses:  1. Chronic pain of right knee   2. Chronic pain of left knee   3. Body mass index 40.0-44.9, adult ([place])   4. Morbid obesity ([place])     Plan: In regards to the right knee she has a radial tear near the medial meniscal root with mild extrusion.  She has tricompartmental chondromalacia worse in the patellofemoral compartment.  In regards to the left knee she also has a radial tear of the medial meniscal root with mild extrusion.  This appears to be worse compared to the prior exam.  Again she has tricompartmental chondromalacia worse in the patellofemoral compartment.  These findings were reviewed with Kayla Marshall in detail and treatment options were discussed to include continued conservative management with weight loss, physical therapy, periodic cortisone injections versus arthroscopic partial medial meniscectomy and chondroplasty.  Her current BMI of 42 precludes her from having a joint replacement at this time.  We talked about her options in detail and at length today.  She will think about her options and let us know what she decides to do.  She also mentioned that her A1c is likely elevated due to being under a lot of stress recently.  The patient meets the AMA guidelines for Morbid (severe) obesity with a BMI > 40.0 and I have recommended weight loss.  Total face to face encounter time was greater than 25 minutes and over half of this time was spent in counseling and/or coordination of care.  Follow-Up Instructions: No follow-ups on file.   Orders:  No orders of the defined types were placed in this encounter.  No orders of the defined types were placed in this encounter.     Procedures: No  procedures performed   Clinical Data: No additional findings.   Subjective: Chief Complaint  Patient presents with   Right Knee - Pain   Left Knee - Pain    Daaiyah returns today to discuss bilateral knee MRI.     Review of Systems   Objective: Vital Signs: Ht 5' 2.5" (1.588 m)   Wt 234 lb 3.2 oz (106.2 kg)   BMI 42.15 kg/m   Physical Exam  Ortho Exam  Bilateral knee exams are unchanged.  Specialty Comments:  No specialty comments available.  Imaging: No results found.   PMFS History: Patient Active Problem List   Diagnosis Date Noted   Chronic pain of right knee 10/25/2021   Diabetic peripheral neuropathy ([place]) 01/06/2020   Pain of neck with recent traumatic injury 12/12/2019   Deviated septum 10/01/2019   Left hip pain 02/13/2019   Chronic pain of left knee 07/17/2018   Morbid obesity ([place]) 07/17/2018   Body mass index 40.0-44.9, adult ([place]) 07/17/2018   Lumbar spondylosis 04/24/2018   Allergic rhinitis 04/24/2018   Anxiety disorder 04/24/2018   Digital mucous cyst of finger of left hand 10/08/2017   Acute medial meniscus tear of left knee 07/30/2017   OSA on CPAP 08/26/2015   Essential (primary) hypertension 04/05/2015   Palpitation 08/21/2012   Unspecified hypothyroidism 08/21/2012  Past Medical History:  Diagnosis Date   Allergies    Anemia    Anxiety    Arthritis    back   B12 deficiency    Back pain    Chewing difficulty    Cold intolerance    Depression    Diabetic peripheral neuropathy (HCC) 01/06/2020   Dry mouth    Fatigue    Gallbladder disease    GERD (gastroesophageal reflux disease)    Glaucoma    Hay fever    Headache    Hoarseness    Hypertension    Hypothyroidism    Irritable bowel    Joint pain    Lactose intolerance    Leg cramps    Muscle stiffness    Nervousness    Obesity    Palpitations    PPH (postpartum hemorrhage)    Prediabetes    Sciatic pain    Sciatica    Shortness of breath dyspnea    Sleep  apnea    Sleep apnea    Stress    Swelling of lower extremity    Tinnitus    Torn meniscus    left   Vitamin D deficiency    Weakness     Family History  Problem Relation Age of Onset   COPD Mother    Stroke Mother    Hypertension Mother    Anxiety disorder Mother    COPD Father    Heart disease Father    Depression Father    Cancer Sister    Breast cancer Sister     Past Surgical History:  Procedure Laterality Date   CHOLECYSTECTOMY  1990   DILATATION & CURETTAGE/HYSTEROSCOPY WITH MYOSURE N/A 11/12/2015   Procedure: DILATATION & CURETTAGE/HYSTEROSCOPY WITH  MYOSURE;  Surgeon: Thurnell Lose, MD;  Location: [facility];  Service: Gynecology;  Laterality: N/A;   Social History   Occupational History   Occupation: Presenter, broadcasting  Tobacco Use   Smoking status: Never   Smokeless tobacco: Never  Substance and Sexual Activity   Alcohol use: Yes    Comment: occas   Drug use: No   Sexual activity: Yes    Birth control/protection: Post-menopausal

## 2021-10-27 ENCOUNTER — Other Ambulatory Visit: Payer: Self-pay

## 2021-10-27 ENCOUNTER — Encounter: Payer: Self-pay | Admitting: Podiatry

## 2021-10-27 ENCOUNTER — Ambulatory Visit: Payer: Medicare Other | Admitting: Podiatry

## 2021-10-27 DIAGNOSIS — L851 Acquired keratosis [keratoderma] palmaris et plantaris: Secondary | ICD-10-CM

## 2021-10-28 NOTE — Progress Notes (Signed)
Subjective:   Patient ID: Kayla Marshall, female   DOB: 70 y.o.   MRN: [id number]   HPI Patient presents with lesion formation plantar left and also does not discomfort in the right joint that was concerning    ROS      Objective:  Physical Exam  Neurovascular status intact with painful keratotic lesion left and also some pain in the right inner phalangeal joint hallux with no swelling associated with it     Assessment:  Keratotic lesion chronic left with pain along with possible inflammation of the right inner phalangeal joint hallux     Plan:  H&P reviewed condition and at this point debrided the lesion left do not recommend treatment right even though I did offer her a cortisone injection and she does not want it currently.  Reappoint for [place] to recheck

## 2022-03-07 DIAGNOSIS — H25813 Combined forms of age-related cataract, bilateral: Secondary | ICD-10-CM | POA: Diagnosis not present

## 2022-03-07 DIAGNOSIS — R7309 Other abnormal glucose: Secondary | ICD-10-CM | POA: Diagnosis not present

## 2022-03-07 DIAGNOSIS — H402231 Chronic angle-closure glaucoma, bilateral, mild stage: Secondary | ICD-10-CM | POA: Diagnosis not present

## 2022-03-22 DIAGNOSIS — H268 Other specified cataract: Secondary | ICD-10-CM | POA: Diagnosis not present

## 2022-03-22 DIAGNOSIS — H25811 Combined forms of age-related cataract, right eye: Secondary | ICD-10-CM | POA: Diagnosis not present

## 2022-03-28 DIAGNOSIS — E1169 Type 2 diabetes mellitus with other specified complication: Secondary | ICD-10-CM | POA: Diagnosis not present

## 2022-03-28 DIAGNOSIS — G629 Polyneuropathy, unspecified: Secondary | ICD-10-CM | POA: Diagnosis not present

## 2022-03-28 DIAGNOSIS — R69 Illness, unspecified: Secondary | ICD-10-CM | POA: Diagnosis not present

## 2022-03-28 DIAGNOSIS — I10 Essential (primary) hypertension: Secondary | ICD-10-CM | POA: Diagnosis not present

## 2022-03-28 DIAGNOSIS — G4733 Obstructive sleep apnea (adult) (pediatric): Secondary | ICD-10-CM | POA: Diagnosis not present

## 2022-05-02 DIAGNOSIS — H25812 Combined forms of age-related cataract, left eye: Secondary | ICD-10-CM | POA: Diagnosis not present

## 2022-06-08 ENCOUNTER — Encounter: Payer: Self-pay | Admitting: Orthopaedic Surgery

## 2022-06-08 ENCOUNTER — Ambulatory Visit (INDEPENDENT_AMBULATORY_CARE_PROVIDER_SITE_OTHER): Payer: Medicare HMO

## 2022-06-08 ENCOUNTER — Ambulatory Visit: Payer: Medicare HMO | Admitting: Orthopaedic Surgery

## 2022-06-08 DIAGNOSIS — M25511 Pain in right shoulder: Secondary | ICD-10-CM

## 2022-06-08 DIAGNOSIS — G8929 Other chronic pain: Secondary | ICD-10-CM

## 2022-06-08 MED ORDER — METHYLPREDNISOLONE ACETATE 40 MG/ML IJ SUSP
40.0000 mg | INTRAMUSCULAR | Status: AC | PRN
  Administered 2022-06-08: 40 mg via INTRA_ARTICULAR

## 2022-06-08 MED ORDER — BUPIVACAINE HCL 0.5 % IJ SOLN
3.0000 mL | INTRAMUSCULAR | Status: AC | PRN
  Administered 2022-06-08: 3 mL via INTRA_ARTICULAR

## 2022-06-08 MED ORDER — LIDOCAINE HCL 1 % IJ SOLN
3.0000 mL | INTRAMUSCULAR | Status: AC | PRN
  Administered 2022-06-08: 3 mL

## 2022-06-08 NOTE — Progress Notes (Signed)
Office Visit Note   Patient: Kayla Marshall           Date of Birth: 11-Sep-1951           MRN: [id number] Visit Date: 06/08/2022              Requested by: Harlan Stains, MD [address],  [place] [address] PCP: Harlan Stains, MD   Assessment & Plan: Visit Diagnoses:  1. Chronic right shoulder pain     Plan: Impression is right shoulder impingement may also have a component of rotator cuff tendinitis.  Based on treatment options she agreed to undergo subacromial injection with relative rest for the next 2 to 4 weeks.    Follow-Up Instructions: No follow-ups on file.   Orders:  Orders Placed This Encounter  Procedures   Large Joint Inj: R subacromial bursa   XR Shoulder Right   No orders of the defined types were placed in this encounter.     Procedures: Large Joint Inj: R subacromial bursa on 06/08/2022 10:08 AM Indications: pain Details: 22 G needle  Arthrogram: No  Medications: 3 mL lidocaine 1 %; 3 mL bupivacaine 0.5 %; 40 mg methylPREDNISolone acetate 40 MG/ML Outcome: tolerated well, no immediate complications Consent was given by the patient. Patient was prepped and draped in the usual sterile fashion.       Clinical Data: No additional findings.   Subjective: Chief Complaint  Patient presents with   Right Shoulder - Pain    HPI Sharne is here for evaluation of right shoulder pain since Christmas.  Feels that it is worse with increased activity.  Feels pain to the superior anterior aspect of the shoulder girdle.  Wakes her up at night.  Denies any particular injuries or trauma.  Meloxicam does not help.  Denies radicular symptoms. Review of Systems  Constitutional: Negative.   HENT: Negative.    Eyes: Negative.   Respiratory: Negative.    Cardiovascular: Negative.   Endocrine: Negative.   Musculoskeletal: Negative.   Neurological: Negative.   Hematological: Negative.   Psychiatric/Behavioral: Negative.    All other  systems reviewed and are negative.    Objective: Vital Signs: There were no vitals taken for this visit.  Physical Exam Vitals and nursing note reviewed.  Constitutional:      Appearance: She is well-developed.  HENT:     Head: Normocephalic and atraumatic.  Pulmonary:     Effort: Pulmonary effort is normal.  Abdominal:     Palpations: Abdomen is soft.  Musculoskeletal:     Cervical back: Neck supple.  Skin:    General: Skin is warm.     Capillary Refill: Capillary refill takes less than 2 seconds.  Neurological:     Mental Status: She is alert and oriented to person, place, and time.  Psychiatric:        Behavior: Behavior normal.        Thought Content: Thought content normal.        Judgment: Judgment normal.     Ortho Exam Examination of the shoulder girdle shows no lesions masses or swelling.  Passive range of motion is preserved with some discomfort towards the extremes.  Manual muscle testing of the rotator cuff is normal.  Pain with Neer sign. Specialty Comments:  No specialty comments available.  Imaging: XR Shoulder Right  Result Date: 06/08/2022 Spurring of the undersurface of the acromion.  No acute abnormalities.    PMFS History: Patient Active Problem  List   Diagnosis Date Noted   Chronic pain of right knee 10/25/2021   Diabetic peripheral neuropathy ([place]) 01/06/2020   Pain of neck with recent traumatic injury 12/12/2019   Deviated septum 10/01/2019   Left hip pain 02/13/2019   Chronic pain of left knee 07/17/2018   Morbid obesity ([place]) 07/17/2018   Body mass index 40.0-44.9, adult ([place]) 07/17/2018   Lumbar spondylosis 04/24/2018   Allergic rhinitis 04/24/2018   Anxiety disorder 04/24/2018   Digital mucous cyst of finger of left hand 10/08/2017   Acute medial meniscus tear of left knee 07/30/2017   OSA on CPAP 08/26/2015   Essential (primary) hypertension 04/05/2015   Palpitation 08/21/2012   Unspecified hypothyroidism 08/21/2012   Past  Medical History:  Diagnosis Date   Allergies    Anemia    Anxiety    Arthritis    back   B12 deficiency    Back pain    Chewing difficulty    Cold intolerance    Depression    Diabetic peripheral neuropathy ([facility]) 01/06/2020   Dry mouth    Fatigue    Gallbladder disease    GERD (gastroesophageal reflux disease)    Glaucoma    Hay fever    Headache    Hoarseness    Hypertension    Hypothyroidism    Irritable bowel    Joint pain    Lactose intolerance    Leg cramps    Muscle stiffness    Nervousness    Obesity    Palpitations    PPH (postpartum hemorrhage)    Prediabetes    Sciatic pain    Sciatica    Shortness of breath dyspnea    Sleep apnea    Sleep apnea    Stress    Swelling of lower extremity    Tinnitus    Torn meniscus    left   Vitamin D deficiency    Weakness     Family History  Problem Relation Age of Onset   COPD Mother    Stroke Mother    Hypertension Mother    Anxiety disorder Mother    COPD Father    Heart disease Father    Depression Father    Cancer Sister    Breast cancer Sister     Past Surgical History:  Procedure Laterality Date   CHOLECYSTECTOMY  1990   DILATATION & CURETTAGE/HYSTEROSCOPY WITH MYOSURE N/A 11/12/2015   Procedure: DILATATION & CURETTAGE/HYSTEROSCOPY WITH  MYOSURE;  Surgeon: Thurnell Lose, MD;  Location: [facility];  Service: Gynecology;  Laterality: N/A;   Social History   Occupational History   Occupation: Presenter, broadcasting  Tobacco Use   Smoking status: Never   Smokeless tobacco: Never  Substance and Sexual Activity   Alcohol use: Yes    Comment: occas   Drug use: No   Sexual activity: Yes    Birth control/protection: Post-menopausal

## 2022-07-03 ENCOUNTER — Other Ambulatory Visit: Payer: Self-pay

## 2022-07-03 ENCOUNTER — Telehealth: Payer: Self-pay | Admitting: Orthopaedic Surgery

## 2022-07-03 NOTE — Telephone Encounter (Signed)
Called and spoke with patient. She said that it was just a verbal recommendation from Dr.Xu. She does not want a formal referral put in, just the name of whom Dr.Xu recommends for PCP. She was thinking it was "[place]"...but not sure.

## 2022-07-03 NOTE — Telephone Encounter (Signed)
Doesn't ring a bell to be honest.  Sorry.

## 2022-07-03 NOTE — Telephone Encounter (Signed)
Yes please. Thanks. 

## 2022-07-03 NOTE — Telephone Encounter (Signed)
Patient called. Would like to know the name of the doctor he referred her to for PCP. Her call back number is [phone]

## 2022-07-04 NOTE — Telephone Encounter (Signed)
Called and let patient know

## 2022-07-20 DIAGNOSIS — H6122 Impacted cerumen, left ear: Secondary | ICD-10-CM | POA: Diagnosis not present

## 2022-08-09 DIAGNOSIS — H25812 Combined forms of age-related cataract, left eye: Secondary | ICD-10-CM | POA: Diagnosis not present

## 2022-08-09 DIAGNOSIS — H268 Other specified cataract: Secondary | ICD-10-CM | POA: Diagnosis not present

## 2022-09-11 DIAGNOSIS — G4733 Obstructive sleep apnea (adult) (pediatric): Secondary | ICD-10-CM | POA: Diagnosis not present

## 2022-09-11 DIAGNOSIS — Z0000 Encounter for general adult medical examination without abnormal findings: Secondary | ICD-10-CM | POA: Diagnosis not present

## 2022-09-11 DIAGNOSIS — G629 Polyneuropathy, unspecified: Secondary | ICD-10-CM | POA: Diagnosis not present

## 2022-09-11 DIAGNOSIS — Z1159 Encounter for screening for other viral diseases: Secondary | ICD-10-CM | POA: Diagnosis not present

## 2022-09-11 DIAGNOSIS — Z0100 Encounter for examination of eyes and vision without abnormal findings: Secondary | ICD-10-CM | POA: Diagnosis not present

## 2022-09-11 DIAGNOSIS — E1169 Type 2 diabetes mellitus with other specified complication: Secondary | ICD-10-CM | POA: Diagnosis not present

## 2022-09-11 DIAGNOSIS — Z1211 Encounter for screening for malignant neoplasm of colon: Secondary | ICD-10-CM | POA: Diagnosis not present

## 2022-09-11 DIAGNOSIS — I10 Essential (primary) hypertension: Secondary | ICD-10-CM | POA: Diagnosis not present

## 2022-09-11 DIAGNOSIS — R69 Illness, unspecified: Secondary | ICD-10-CM | POA: Diagnosis not present

## 2022-10-04 DIAGNOSIS — Z1211 Encounter for screening for malignant neoplasm of colon: Secondary | ICD-10-CM | POA: Diagnosis not present

## 2022-12-14 ENCOUNTER — Ambulatory Visit: Payer: Medicare HMO | Admitting: Orthopaedic Surgery

## 2022-12-25 ENCOUNTER — Ambulatory Visit (INDEPENDENT_AMBULATORY_CARE_PROVIDER_SITE_OTHER): Payer: Medicare HMO

## 2022-12-25 ENCOUNTER — Ambulatory Visit: Payer: Medicare HMO | Admitting: Physician Assistant

## 2022-12-25 ENCOUNTER — Encounter: Payer: Self-pay | Admitting: Physician Assistant

## 2022-12-25 DIAGNOSIS — M25522 Pain in left elbow: Secondary | ICD-10-CM | POA: Diagnosis not present

## 2022-12-25 DIAGNOSIS — Z8739 Personal history of other diseases of the musculoskeletal system and connective tissue: Secondary | ICD-10-CM | POA: Diagnosis not present

## 2022-12-25 MED ORDER — DICLOFENAC SODIUM 75 MG PO TBEC: 75.0000 mg | DELAYED_RELEASE_TABLET | Freq: Two times a day (BID) | ORAL | 2 refills | Status: DC | PRN

## 2022-12-25 NOTE — Progress Notes (Signed)
Office Visit Note   Patient: Kayla Marshall           Date of Birth: 12/20/1950           MRN: [id number] Visit Date: 12/25/2022              Requested by: Harlan Stains, MD [address],  [place] [address] PCP: Harlan Stains, MD   Assessment & Plan: Visit Diagnoses:  1. History of left tennis elbow     Plan: Impression is left tennis elbow.  Today, we discussed various treatment options to include tennis elbow strap and exercises in addition to NSAIDs versus injection.  She would like to first try the strap, exercises and NSAIDs.  If her symptoms do not prove she will follow-up for injection.  Call with concerns or questions.  Follow-Up Instructions: Return if symptoms worsen or fail to improve.   Orders:  Orders Placed This Encounter  Procedures   XR Elbow 2 Views Left   No orders of the defined types were placed in this encounter.     Procedures: No procedures performed   Clinical Data: No additional findings.   Subjective: Chief Complaint  Patient presents with   Left Elbow - Pain    HPI patient is a pleasant 72 year old female who comes in today with left elbow pain for the past several months.  She lives on the second floor and notes that she holds a stair reeling with her right hand and has to pull her grocery cart up with the left arm.  She thinks she may have hyperextended her elbow at some point.  Symptoms are worse when she is picking up a heavy book.  She has been taking Tylenol arthritis without significant relief.  Review of Systems as detailed in HPI.  All others reviewed and are negative.   Objective: Vital Signs: There were no vitals taken for this visit.  Physical Exam well-developed and well-nourished female no acute distress.  Alert and oriented x 3.  Ortho Exam left elbow exam shows moderate tenderness to the lateral epicondyle.  No tenderness to radial tunnel.  She has increased pain with resisted long finger  extension as well as when she shakes my hand.  She is neurovascular intact distally.  Specialty Comments:  No specialty comments available.  Imaging: XR Elbow 2 Views Left  Result Date: 12/25/2022 No acute or structural abnormalities    PMFS History: Patient Active Problem List   Diagnosis Date Noted   Chronic pain of right knee 10/25/2021   Diabetic peripheral neuropathy ([place]) 01/06/2020   Pain of neck with recent traumatic injury 12/12/2019   Deviated septum 10/01/2019   Left hip pain 02/13/2019   Chronic pain of left knee 07/17/2018   Morbid obesity ([place]) 07/17/2018   Body mass index 40.0-44.9, adult ([place]) 07/17/2018   Lumbar spondylosis 04/24/2018   Allergic rhinitis 04/24/2018   Anxiety disorder 04/24/2018   Digital mucous cyst of finger of left hand 10/08/2017   Acute medial meniscus tear of left knee 07/30/2017   OSA on CPAP 08/26/2015   Essential (primary) hypertension 04/05/2015   Palpitation 08/21/2012   Unspecified hypothyroidism 08/21/2012   Past Medical History:  Diagnosis Date   Allergies    Anemia    Anxiety    Arthritis    back   B12 deficiency    Back pain    Chewing difficulty    Cold intolerance    Depression    Diabetic  peripheral neuropathy (HCC) 01/06/2020   Dry mouth    Fatigue    Gallbladder disease    GERD (gastroesophageal reflux disease)    Glaucoma    Hay fever    Headache    Hoarseness    Hypertension    Hypothyroidism    Irritable bowel    Joint pain    Lactose intolerance    Leg cramps    Muscle stiffness    Nervousness    Obesity    Palpitations    PPH (postpartum hemorrhage)    Prediabetes    Sciatic pain    Sciatica    Shortness of breath dyspnea    Sleep apnea    Sleep apnea    Stress    Swelling of lower extremity    Tinnitus    Torn meniscus    left   Vitamin D deficiency    Weakness     Family History  Problem Relation Age of Onset   COPD Mother    Stroke Mother    Hypertension Mother    Anxiety  disorder Mother    COPD Father    Heart disease Father    Depression Father    Cancer Sister    Breast cancer Sister     Past Surgical History:  Procedure Laterality Date   CHOLECYSTECTOMY  1990   DILATATION & CURETTAGE/HYSTEROSCOPY WITH MYOSURE N/A 11/12/2015   Procedure: DILATATION & CURETTAGE/HYSTEROSCOPY WITH  MYOSURE;  Surgeon: Thurnell Lose, MD;  Location: [facility];  Service: Gynecology;  Laterality: N/A;   Social History   Occupational History   Occupation: Presenter, broadcasting  Tobacco Use   Smoking status: Never   Smokeless tobacco: Never  Substance and Sexual Activity   Alcohol use: Yes    Comment: occas   Drug use: No   Sexual activity: Yes    Birth control/protection: Post-menopausal

## 2023-01-05 DIAGNOSIS — H938X1 Other specified disorders of right ear: Secondary | ICD-10-CM | POA: Diagnosis not present

## 2023-01-09 DIAGNOSIS — H6991 Unspecified Eustachian tube disorder, right ear: Secondary | ICD-10-CM | POA: Diagnosis not present

## 2023-01-17 DIAGNOSIS — H65193 Other acute nonsuppurative otitis media, bilateral: Secondary | ICD-10-CM | POA: Diagnosis not present

## 2023-01-30 DIAGNOSIS — G514 Facial myokymia: Secondary | ICD-10-CM | POA: Diagnosis not present

## 2023-01-30 DIAGNOSIS — H00022 Hordeolum internum right lower eyelid: Secondary | ICD-10-CM | POA: Diagnosis not present

## 2023-02-06 DIAGNOSIS — H402231 Chronic angle-closure glaucoma, bilateral, mild stage: Secondary | ICD-10-CM | POA: Diagnosis not present

## 2023-02-06 DIAGNOSIS — H01022 Squamous blepharitis right lower eyelid: Secondary | ICD-10-CM | POA: Diagnosis not present

## 2023-02-06 DIAGNOSIS — H00022 Hordeolum internum right lower eyelid: Secondary | ICD-10-CM | POA: Diagnosis not present

## 2023-02-06 DIAGNOSIS — H04123 Dry eye syndrome of bilateral lacrimal glands: Secondary | ICD-10-CM | POA: Diagnosis not present

## 2023-02-08 DIAGNOSIS — R0981 Nasal congestion: Secondary | ICD-10-CM | POA: Diagnosis not present

## 2023-02-08 DIAGNOSIS — H903 Sensorineural hearing loss, bilateral: Secondary | ICD-10-CM | POA: Diagnosis not present

## 2023-02-21 DIAGNOSIS — R0609 Other forms of dyspnea: Secondary | ICD-10-CM | POA: Diagnosis not present

## 2023-02-21 DIAGNOSIS — Z6841 Body Mass Index (BMI) 40.0 and over, adult: Secondary | ICD-10-CM | POA: Diagnosis not present

## 2023-02-21 DIAGNOSIS — R5383 Other fatigue: Secondary | ICD-10-CM | POA: Diagnosis not present

## 2023-03-15 DIAGNOSIS — F5101 Primary insomnia: Secondary | ICD-10-CM | POA: Diagnosis not present

## 2023-03-15 DIAGNOSIS — E1142 Type 2 diabetes mellitus with diabetic polyneuropathy: Secondary | ICD-10-CM | POA: Diagnosis not present

## 2023-03-15 DIAGNOSIS — F419 Anxiety disorder, unspecified: Secondary | ICD-10-CM | POA: Diagnosis not present

## 2023-03-15 DIAGNOSIS — R0609 Other forms of dyspnea: Secondary | ICD-10-CM | POA: Diagnosis not present

## 2023-03-15 DIAGNOSIS — F4310 Post-traumatic stress disorder, unspecified: Secondary | ICD-10-CM | POA: Diagnosis not present

## 2023-03-15 DIAGNOSIS — Z6841 Body Mass Index (BMI) 40.0 and over, adult: Secondary | ICD-10-CM | POA: Diagnosis not present

## 2023-03-15 DIAGNOSIS — E1169 Type 2 diabetes mellitus with other specified complication: Secondary | ICD-10-CM | POA: Diagnosis not present

## 2023-03-15 DIAGNOSIS — I10 Essential (primary) hypertension: Secondary | ICD-10-CM | POA: Diagnosis not present

## 2023-03-15 DIAGNOSIS — I6522 Occlusion and stenosis of left carotid artery: Secondary | ICD-10-CM | POA: Diagnosis not present

## 2023-04-19 DIAGNOSIS — Z01419 Encounter for gynecological examination (general) (routine) without abnormal findings: Secondary | ICD-10-CM | POA: Diagnosis not present

## 2023-05-02 ENCOUNTER — Ambulatory Visit: Payer: Medicare HMO | Admitting: Cardiology

## 2023-05-11 ENCOUNTER — Emergency Department (HOSPITAL_COMMUNITY)
Admission: EM | Admit: 2023-05-11 | Discharge: 2023-05-11 | Disposition: A | Discharge status: Home / Self Care | Payer: Medicare HMO | Attending: Emergency Medicine | Admitting: Emergency Medicine

## 2023-05-11 ENCOUNTER — Other Ambulatory Visit: Payer: Self-pay

## 2023-05-11 ENCOUNTER — Emergency Department (HOSPITAL_COMMUNITY): Payer: Medicare HMO

## 2023-05-11 ENCOUNTER — Ambulatory Visit (HOSPITAL_COMMUNITY)
Admission: EM | Admit: 2023-05-11 | Discharge: 2023-05-11 | Disposition: A | Discharge status: Home / Self Care | Payer: Medicare HMO | Attending: Emergency Medicine | Admitting: Emergency Medicine

## 2023-05-11 ENCOUNTER — Encounter (HOSPITAL_COMMUNITY): Payer: Self-pay | Admitting: Emergency Medicine

## 2023-05-11 ENCOUNTER — Encounter (HOSPITAL_COMMUNITY): Payer: Self-pay

## 2023-05-11 DIAGNOSIS — I10 Essential (primary) hypertension: Secondary | ICD-10-CM | POA: Diagnosis not present

## 2023-05-11 DIAGNOSIS — Z79899 Other long term (current) drug therapy: Secondary | ICD-10-CM | POA: Diagnosis not present

## 2023-05-11 DIAGNOSIS — Z7984 Long term (current) use of oral hypoglycemic drugs: Secondary | ICD-10-CM | POA: Diagnosis not present

## 2023-05-11 DIAGNOSIS — E119 Type 2 diabetes mellitus without complications: Secondary | ICD-10-CM | POA: Insufficient documentation

## 2023-05-11 DIAGNOSIS — R0789 Other chest pain: Secondary | ICD-10-CM | POA: Diagnosis not present

## 2023-05-11 DIAGNOSIS — R079 Chest pain, unspecified: Secondary | ICD-10-CM | POA: Diagnosis not present

## 2023-05-11 LAB — BASIC METABOLIC PANEL
Anion gap: 13 (ref 5–15)
BUN: 14 mg/dL (ref 8–23)
CO2: 23 mmol/L (ref 22–32)
Calcium: 9.4 mg/dL (ref 8.9–10.3)
Chloride: 104 mmol/L (ref 98–111)
Creatinine, Ser: 0.81 mg/dL (ref 0.44–1.00)
GFR, Estimated: >60 mL/min (ref >60)
Glucose, Bld: 104 mg/dL — ABNORMAL HIGH (ref 70–99)
Potassium: 4 mmol/L (ref 3.5–5.1)
Sodium: 140 mmol/L (ref 135–145)

## 2023-05-11 LAB — CBC
HCT: 42 % (ref 36.0–46.0)
Hemoglobin: 13.8 g/dL (ref 12.0–15.0)
MCH: 30.7 pg (ref 26.0–34.0)
MCHC: 32.9 g/dL (ref 30.0–36.0)
MCV: 93.3 fL (ref 80.0–100.0)
Platelets: 270 10*3/uL (ref 150–400)
RBC: 4.5 MIL/uL (ref 3.87–5.11)
RDW: 13 % (ref 11.5–15.5)
WBC: 8 10*3/uL (ref 4.0–10.5)
nRBC: 0 % (ref 0.0–0.2)

## 2023-05-11 LAB — TROPONIN I (HIGH SENSITIVITY)
Troponin I (High Sensitivity): 6 ng/L (ref <18)
Troponin I (High Sensitivity): 4 ng/L (ref <18)

## 2023-05-11 NOTE — ED Notes (Signed)
Patient is being discharged from the Urgent Care and sent to the Emergency Department via POV with family . Per Rinaldo Ratel, [place], patient is in need of higher level of care due to sudden CP with changes on EKG. Patient is aware and verbalizes understanding of plan of care.  Vitals:   05/11/23 1421  BP: 139/84  Pulse: 66  Resp: 18  Temp: 98.4 F (36.9 C)  SpO2: 98%

## 2023-05-11 NOTE — ED Notes (Signed)
The pt reports that she has been having spasms   confortable now  she is hungry and she wants something to eat

## 2023-05-11 NOTE — Discharge Instructions (Addendum)
Follow-up with your cardiologist Dr. Jacinto Halim  next week for recheck.

## 2023-05-11 NOTE — ED Provider Notes (Signed)
Patient presents to clinic for central chest pains.  Around noon she felt multiple spasms in her ribs, she did take half of a 0.25 mg Xanax, without much relief.  Around 130 she noticed she had another spasm.  She is unable to describe her chest pain/spasms any further.  Reports she is short of breath at baseline.   EKG showed normal sinus rhythm at 66 bpm with a age undetermined septal infarct.  No obvious ST elevation or ST depression.  Lungs vesicular posteriorly, heart with regular rate and rhythm on auscultation.  Discussed that due to patient's age, comorbidities and complaint, that she would benefit from further evaluation in the emergency department, where they can do labs such as troponin.  Patient will to the nearest emergency department via personal vehicle.   Kate Larock, [place] N, [place] 05/11/23 1438

## 2023-05-11 NOTE — ED Triage Notes (Signed)
Pt c/o Mid cp that started one hour ago. Pt states she has Hx of anxiety, but this time feels different. Pt states she tool a xanax prior to arrival with no relief.

## 2023-05-11 NOTE — ED Triage Notes (Signed)
Pt arrived POV from UC. Pt reports central chest spasms, no radiation. Pt reports she took 12.5mg  xanax d/t thinking the spasm was anxiety. Pt had another spasm and took another 12.5 mg xanax and hasn't had anymore spasms since. Onset 1200.

## 2023-05-11 NOTE — ED Provider Notes (Signed)
[facility] EMERGENCY DEPARTMENT AT [facility] Provider Note   CSN: [id number] Arrival date & time: 05/11/23  1454     History {Add pertinent medical, surgical, social history, OB history to HPI:1} Chief Complaint  Patient presents with   Chest Spasm    Kayla Marshall is a 72 y.o. female.  Patient has had some chest discomfort today lasting just a few seconds to times.  She has a history of hypertension and obesity   Chest Pain      Home Medications Prior to Admission medications   Medication Sig Start Date End Date Taking? Authorizing Provider  ALPRAZolam (XANAX) 0.25 MG tablet Take 0.25 mg by mouth 2 (two) times daily as needed for anxiety or sleep.  08/02/15   [provider]  Ascorbic Acid (VITAMIN C) 1000 MG tablet Take 2,000 mg by mouth daily.    [provider]  Calcium 500-100 MG-UNIT CHEW Chew by mouth.    [provider]  cholecalciferol (VITAMIN D3) 25 MCG (1000 UNIT) tablet Take 5,000 Units by mouth daily.    [provider]  co-enzyme Q-10 30 MG capsule Take 30 mg by mouth 3 (three) times daily.    [provider]  diclofenac (VOLTAREN) 75 MG EC tablet Take 1 tablet (75 mg total) by mouth 2 (two) times daily as needed. Do not take while on sterapred taper 09/29/20   Cristie Hem, PA-C  diclofenac (VOLTAREN) 75 MG EC tablet Take 1 tablet (75 mg total) by mouth 2 (two) times daily as needed. 12/25/22   Cristie Hem, PA-C  folic acid (FOLVITE) 1 MG tablet Take 1 mg by mouth daily.    [provider]  HYDROcodone-acetaminophen (NORCO) 5-325 MG tablet Take 1 tablet by mouth 3 (three) times daily as needed. 10/06/20   Cristie Hem, PA-C  latanoprost (XALATAN) 0.005 % ophthalmic solution Place 0.005 drops into both eyes at bedtime. 08/02/15   [provider]  lisinopril (PRINIVIL,ZESTRIL) 20 MG tablet  02/21/18   [provider]  lisinopril-hydrochlorothiazide (PRINZIDE,ZESTORETIC)  20-12.5 MG tablet  01/10/19   [provider]  magnesium 30 MG tablet Take 30 mg by mouth 2 (two) times daily.    [provider]  meloxicam (MOBIC) 7.5 MG tablet TAKE 1 TABLET TWICE DAILY AS NEEDED FOR PAIN 05/20/20   Cristie Hem, PA-C  meloxicam (MOBIC) 7.5 MG tablet Take 1 tablet (7.5 mg total) by mouth 2 (two) times daily as needed for pain. 04/07/20   Tarry Kos, MD  metFORMIN (GLUCOPHAGE) 500 MG tablet Take 1 tablet (500 mg total) by mouth daily with breakfast. 03/05/19   Quillian Quince D, MD  methocarbamol (ROBAXIN) 500 MG tablet Take 1 tablet (500 mg total) by mouth 2 (two) times daily as needed. 09/29/20   Cristie Hem, PA-C  predniSONE (STERAPRED UNI-PAK 21 TAB) 5 MG (21) TBPK tablet Take as directed.  Do not take any other nsaids while on this medication Patient taking differently: Take as directed.  Do not take any other nsaids while on this medication 09/29/20   Cristie Hem, PA-C  traMADol (ULTRAM) 50 MG tablet Take 1 tablet (50 mg total) by mouth 3 (three) times daily as needed. 10/01/20   Cristie Hem, PA-C  Zinc 25 MG TABS Take by mouth.    [provider]      Allergies    Erythromycin, Avelox [moxifloxacin hcl in nacl], Hydrocodone-acetaminophen, Meloxicam, Moxifloxacin, and Quinolones  Review of Systems   Review of Systems  Cardiovascular:  Positive for chest pain.    Physical Exam Updated Vital Signs BP (!) 151/84 (BP Location: Right Arm)   Pulse 77   Temp 98.5 F (36.9 C) (Oral)   Resp 18   Ht 5' 2.5" (1.588 m)   Wt 107.5 kg   SpO2 100%   BMI 42.66 kg/m  Physical Exam  ED Results / Procedures / Treatments   Labs (all labs ordered are listed, but only abnormal results are displayed) Labs Reviewed  BASIC METABOLIC PANEL - Abnormal; Notable for the following components:      Result Value   Glucose, Bld 104 (*)    All other components within normal limits  CBC  TROPONIN I (HIGH SENSITIVITY)  TROPONIN I (HIGH  SENSITIVITY)    EKG EKG Interpretation  Date/Time:  Friday May 11 2023 15:01:15 EDT Ventricular Rate:  69 PR Interval:  158 QRS Duration: 80 QT Interval:  644 QTC Calculation: 690 R Axis:   43 Text Interpretation: *** Critical Test Result: Long QTc Normal sinus rhythm Septal infarct , age undetermined Abnormal ECG When compared with ECG of 11-May-2023 14:15, PREVIOUS ECG IS PRESENT Confirmed by Bethann Berkshire [phone]) on 05/11/2023 6:00:36 PM  Radiology DG Chest 2 View  Result Date: 05/11/2023 CLINICAL DATA:  Chest Pain EXAM: CHEST - 2 VIEW COMPARISON:  Chest x-ray April 30, 2008. FINDINGS: The heart size and mediastinal contours are within normal limits. Both lungs are clear. No visible pleural effusions or pneumothorax. No acute osseous abnormality. IMPRESSION: No active cardiopulmonary disease. Electronically Signed   By: Feliberto Harts M.D.   On: 05/11/2023 15:50    Procedures Procedures  {Document cardiac monitor, telemetry assessment procedure when appropriate:1}  Medications Ordered in ED Medications - No data to display  ED Course/ Medical Decision Making/ A&P   {   Click here for ABCD2, HEART and other calculatorsREFRESH Note before signing :1}                          Medical Decision Making Amount and/or Complexity of Data Reviewed Labs: ordered. Radiology: ordered.   Patient with chest pain and normal troponins doubt cardiac related.  She is to follow-up with her cardiologist  {Document critical care time when appropriate:1} {Document review of labs and clinical decision tools ie heart score, Chads2Vasc2 etc:1}  {Document your independent review of radiology images, and any outside records:1} {Document your discussion with family members, caretakers, and with consultants:1} {Document social determinants of health affecting pt's care:1} {Document your decision making why or why not admission, treatments were needed:1} Final Clinical Impression(s) / ED  Diagnoses Final diagnoses:  Atypical chest pain    Rx / DC Orders ED Discharge Orders     None

## 2023-05-30 ENCOUNTER — Encounter: Payer: Self-pay | Admitting: Cardiology

## 2023-05-30 ENCOUNTER — Ambulatory Visit: Payer: Medicare HMO | Admitting: Cardiology

## 2023-05-30 VITALS — BP 138/76 | HR 76 | Ht 62.5 in | Wt 236.0 lb

## 2023-05-30 DIAGNOSIS — R002 Palpitations: Secondary | ICD-10-CM | POA: Diagnosis not present

## 2023-05-30 DIAGNOSIS — R0609 Other forms of dyspnea: Secondary | ICD-10-CM

## 2023-05-30 NOTE — Progress Notes (Signed)
Patient referred by Laurann Montana, MD for exertional dyspnea  Subjective:   Kayla Marshall, female    DOB: Dec 04, 1950, 72 y.o.   MRN: [id number]   Chief Complaint  Patient presents with   Shortness of Breath   New Patient (Initial Visit)     HPI  72 y.o. Caucasian female with hypertension, prediabetes, PTSD, morbid obesity, exertional dyspnea  Patient has had progressively worse dyspnea on exertion since 2021. This has also correlated with nearly 100 lb weight gain. Patient has tried diet and exercise without benefit. She has 23 steps to her apartment and she has to stop several times due to dyspnea. She denies any chest pain.     Past Medical History:  Diagnosis Date   Allergies    Anemia    Anxiety    Arthritis    back   B12 deficiency    Back pain    Chewing difficulty    Cold intolerance    Depression    Diabetic peripheral neuropathy (HCC) 01/06/2020   Dry mouth    Fatigue    Gallbladder disease    GERD (gastroesophageal reflux disease)    Glaucoma    Hay fever    Headache    Hoarseness    Hypertension    Hypothyroidism    Irritable bowel    Joint pain    Lactose intolerance    Leg cramps    Muscle stiffness    Nervousness    Obesity    Palpitations    PPH (postpartum hemorrhage)    Prediabetes    Sciatic pain    Sciatica    Shortness of breath dyspnea    Sleep apnea    Sleep apnea    Stress    Swelling of lower extremity    Tinnitus    Torn meniscus    left   Vitamin D deficiency    Weakness      Past Surgical History:  Procedure Laterality Date   CHOLECYSTECTOMY  1990   DILATATION & CURETTAGE/HYSTEROSCOPY WITH MYOSURE N/A 11/12/2015   Procedure: DILATATION & CURETTAGE/HYSTEROSCOPY WITH  MYOSURE;  Surgeon: Geryl Rankins, MD;  Location: [facility];  Service: Gynecology;  Laterality: N/A;     Social History   Tobacco Use  Smoking Status Never  Smokeless Tobacco Never    Social History   Substance and Sexual Activity  Alcohol  Use Yes   Comment: occas     Family History  Problem Relation Age of Onset   COPD Mother    Stroke Mother    Hypertension Mother    Anxiety disorder Mother    COPD Father    Heart disease Father    Depression Father    Cancer Sister    Breast cancer Sister       Current Outpatient Medications:    ALPRAZolam (XANAX) 0.25 MG tablet, Take 0.25 mg by mouth 2 (two) times daily as needed for anxiety or sleep. , Disp: , Rfl: 0   ALPRAZolam (XANAX) 1 MG tablet, Take 1 mg by mouth as needed for sleep., Disp: , Rfl:    Ascorbic Acid (VITAMIN C) 1000 MG tablet, Take 2,000 mg by mouth daily., Disp: , Rfl:    cholecalciferol (VITAMIN D3) 25 MCG (1000 UNIT) tablet, Take 5,000 Units by mouth daily., Disp: , Rfl:    latanoprost (XALATAN) 0.005 % ophthalmic solution, Place 0.005 drops into both eyes at bedtime., Disp: , Rfl: 4   lisinopril (ZESTRIL) 10 MG tablet, Take  10 mg by mouth daily., Disp: , Rfl:    magnesium 30 MG tablet, Take 30 mg by mouth 2 (two) times daily., Disp: , Rfl:    Zinc 25 MG TABS, Take by mouth., Disp: , Rfl:    Cardiovascular and other pertinent studies:  Reviewed external labs and tests, independently interpreted  EKG 05/30/2023: Sinus rhythm 75 bpm  Cannot exclude old anteroseptal infarct   Recent labs: Oct 2023-June 2024: Glucose 104, BUN/Cr 14/0.8. EGFR 88. K 4.0 Hb 13.8 HbA1C 6.2% Chol 176, TG 196, HDL 61, LDL 83 TSH 1.9normal BNP 13 normal    Review of Systems  Cardiovascular:  Positive for dyspnea on exertion. Negative for chest pain, leg swelling, palpitations and syncope.         Vitals:   05/30/23 0956  BP: 138/76  Pulse: 76  SpO2: 96%     Body mass index is 42.48 kg/m. Filed Weights   05/30/23 0956  Weight: 236 lb (107 kg)     Objective:   Physical Exam Vitals and nursing note reviewed.  Constitutional:      General: She is not in acute distress.    Appearance: She is obese.  Neck:     Vascular: No JVD.   Cardiovascular:     Rate and Rhythm: Normal rate and regular rhythm.     Heart sounds: Normal heart sounds. No murmur heard. Pulmonary:     Effort: Pulmonary effort is normal.     Breath sounds: Normal breath sounds. No wheezing or rales.  Musculoskeletal:     Right lower leg: No edema.     Left lower leg: No edema.            Visit diagnoses:   ICD-10-CM   1. Dyspnea on exertion  R06.09 EKG 12-Lead    CT CARDIAC SCORING (SELF PAY ONLY)    PCV ECHOCARDIOGRAM COMPLETE    2. Morbid obesity (HCC)  E66.01        Orders Placed This Encounter  Procedures   EKG 12-Lead     Medication changes this visit: Medications Discontinued During This Encounter  Medication Reason   Cholecalciferol 125 MCG (5000 UT) capsule Duplicate    No orders of the defined types were placed in this encounter.    Assessment & Recommendations:   72 y.o. Caucasian female with hypertension, prediabetes, PTSD, morbid obesity, exertional dyspnea  Exertional dyspnea: Most likely due to morbid obesity.  No clear anginal symptoms.  Will obtain echocardiogram, and CT cardiac scoring for CAD screening.  She is contemplating, but not quite ready to consider weight loss medications here.  We talked about intermittent fasting and calorie control.  Further recommendations after above testing.  Thank you for referring the patient to [place]. Please feel free to contact with any questions.   Elder Negus, MD Pager: [phone] Office: [phone]

## 2023-06-11 ENCOUNTER — Ambulatory Visit (HOSPITAL_COMMUNITY): Payer: Medicare HMO

## 2023-06-20 ENCOUNTER — Ambulatory Visit: Payer: Medicare HMO

## 2023-06-20 DIAGNOSIS — R0609 Other forms of dyspnea: Secondary | ICD-10-CM

## 2023-06-26 ENCOUNTER — Ambulatory Visit (HOSPITAL_COMMUNITY)
Admission: RE | Admit: 2023-06-26 | Discharge: 2023-06-26 | Disposition: A | Discharge status: Home / Self Care | Payer: Medicare HMO | Source: Ambulatory Visit | Attending: Cardiology | Admitting: Cardiology

## 2023-06-26 DIAGNOSIS — R0609 Other forms of dyspnea: Secondary | ICD-10-CM | POA: Insufficient documentation

## 2023-07-11 ENCOUNTER — Encounter: Payer: Self-pay | Admitting: Cardiology

## 2023-07-11 ENCOUNTER — Ambulatory Visit: Payer: Medicare HMO | Admitting: Cardiology

## 2023-07-11 VITALS — BP 136/81 | HR 74 | Resp 16 | Ht 62.0 in | Wt 234.0 lb

## 2023-07-11 DIAGNOSIS — R0609 Other forms of dyspnea: Secondary | ICD-10-CM | POA: Diagnosis not present

## 2023-07-11 DIAGNOSIS — I10 Essential (primary) hypertension: Secondary | ICD-10-CM

## 2023-07-11 NOTE — Progress Notes (Signed)
Patient referred by Laurann Montana, MD for exertional dyspnea  Subjective:   Kayla Marshall, female    DOB: Jul 04, 1951, 72 y.o.   MRN: [id number]   Chief Complaint  Patient presents with   Dyspnea on exertion   Follow-up    6-8 weeks     HPI  72 y.o. Caucasian female with hypertension, prediabetes, PTSD, morbid obesity, exertional dyspnea  Reviewed recent test results with the patient, details below. Patient is making changes todiet and trying to increase physical activity.    Initial consultation visit 05/2023: Patient has had progressively worse dyspnea on exertion since 2021. This has also correlated with nearly 100 lb weight gain. Patient has tried diet and exercise without benefit. She has 23 steps to her apartment and she has to stop several times due to dyspnea. She denies any chest pain.    Current Outpatient Medications:    ALPRAZolam (XANAX) 0.25 MG tablet, Take 0.25 mg by mouth 2 (two) times daily as needed for anxiety or sleep. , Disp: , Rfl: 0   ALPRAZolam (XANAX) 1 MG tablet, Take 1 mg by mouth as needed for sleep., Disp: , Rfl:    Ascorbic Acid (VITAMIN C) 1000 MG tablet, Take 2,000 mg by mouth daily., Disp: , Rfl:    cholecalciferol (VITAMIN D3) 25 MCG (1000 UNIT) tablet, Take 5,000 Units by mouth daily., Disp: , Rfl:    latanoprost (XALATAN) 0.005 % ophthalmic solution, Place 0.005 drops into both eyes at bedtime., Disp: , Rfl: 4   lisinopril (ZESTRIL) 10 MG tablet, Take 10 mg by mouth daily., Disp: , Rfl:    magnesium 30 MG tablet, Take 30 mg by mouth 2 (two) times daily., Disp: , Rfl:    Zinc 25 MG TABS, Take by mouth., Disp: , Rfl:    Cardiovascular and other pertinent studies:  Reviewed external labs and tests, independently interpreted  EKG 05/30/2023: Sinus rhythm 75 bpm  Cannot exclude old anteroseptal infarct  CT cardiac scoring 07/01/2023: Calcium score 0 Small hiatal hernia.  Echocardiogram 06/20/2023: Left ventricle cavity is normal in  size. Mild concentric hypertrophy of the left ventricle. Normal global wall motion. Normal LV systolic function with EF 66%. Doppler evidence of grade I (impaired) diastolic dysfunction, normal LAP. Left atrial cavity is mildly dilated. Trileaflet aortic valve with aortic sclerosis No significant stenosis or regurgitation. No evidence of pulmonary hypertension. Previous study in 2018 reported severe LA dilatation, mild to moderate MR.  Recent labs: Oct 2023-June 2024: Glucose 104, BUN/Cr 14/0.8. EGFR 88. K 4.0 Hb 13.8 HbA1C 6.2% Chol 176, TG 196, HDL 61, LDL 83 TSH 1.9normal BNP 13 normal    Review of Systems  Cardiovascular:  Positive for dyspnea on exertion. Negative for chest pain, leg swelling, palpitations and syncope.         Vitals:   07/11/23 1038  BP: 136/81  Pulse: 74  Resp: 16  SpO2: 96%      Body mass index is 42.8 kg/m. Filed Weights   07/11/23 1038  Weight: 234 lb (106.1 kg)      Objective:   Physical Exam Vitals and nursing note reviewed.  Constitutional:      General: She is not in acute distress.    Appearance: She is obese.  Neck:     Vascular: No JVD.  Cardiovascular:     Rate and Rhythm: Normal rate and regular rhythm.     Heart sounds: Normal heart sounds. No murmur heard. Pulmonary:     Effort:  Pulmonary effort is normal.     Breath sounds: Normal breath sounds. No wheezing or rales.  Musculoskeletal:     Right lower leg: No edema.     Left lower leg: No edema.            Visit diagnoses:   ICD-10-CM   1. Dyspnea on exertion  R06.09     2. Morbid obesity (HCC)  E66.01     3. Essential (primary) hypertension  I10         Assessment & Recommendations:   72 y.o. Caucasian female with hypertension, prediabetes, PTSD, morbid obesity, exertional dyspnea  Exertional dyspnea: Echocardiogram showed normal EF, grade 1 diastolic dysfunction, mild left atrial dilatation-also likely due to obesity.  Calcium score was 0.   Overall, no obvious findings to explain her severe exertional dyspnea.  I suspect her dyspnea is due to deconditioning and obesity.  I will see her on as needed basis.      Elder Negus, MD Pager: [phone] Office: [phone]

## 2023-08-09 ENCOUNTER — Ambulatory Visit: Payer: Medicare HMO | Admitting: Physician Assistant

## 2023-08-09 ENCOUNTER — Ambulatory Visit: Payer: Medicare HMO | Admitting: Podiatry

## 2023-08-09 ENCOUNTER — Encounter: Payer: Self-pay | Admitting: Podiatry

## 2023-08-09 ENCOUNTER — Encounter: Payer: Self-pay | Admitting: Physician Assistant

## 2023-08-09 ENCOUNTER — Ambulatory Visit (INDEPENDENT_AMBULATORY_CARE_PROVIDER_SITE_OTHER): Payer: Medicare HMO

## 2023-08-09 VITALS — BP 138/72 | HR 67

## 2023-08-09 DIAGNOSIS — G8929 Other chronic pain: Secondary | ICD-10-CM | POA: Diagnosis not present

## 2023-08-09 DIAGNOSIS — Z6841 Body Mass Index (BMI) 40.0 and over, adult: Secondary | ICD-10-CM | POA: Diagnosis not present

## 2023-08-09 DIAGNOSIS — M170 Bilateral primary osteoarthritis of knee: Secondary | ICD-10-CM | POA: Diagnosis not present

## 2023-08-09 DIAGNOSIS — M25562 Pain in left knee: Secondary | ICD-10-CM

## 2023-08-09 DIAGNOSIS — G629 Polyneuropathy, unspecified: Secondary | ICD-10-CM

## 2023-08-09 DIAGNOSIS — G500 Trigeminal neuralgia: Secondary | ICD-10-CM | POA: Diagnosis not present

## 2023-08-09 DIAGNOSIS — M5412 Radiculopathy, cervical region: Secondary | ICD-10-CM | POA: Diagnosis not present

## 2023-08-09 MED ORDER — METHYLPREDNISOLONE ACETATE 40 MG/ML IJ SUSP
13.3300 mg | INTRAMUSCULAR | Status: AC | PRN
Start: 2023-08-09 — End: 2023-08-09
  Administered 2023-08-09: 13.33 mg via INTRA_ARTICULAR

## 2023-08-09 MED ORDER — BUPIVACAINE HCL 0.25 % IJ SOLN
0.6600 mL | INTRAMUSCULAR | Status: AC | PRN
Start: 2023-08-09 — End: 2023-08-09
  Administered 2023-08-09: .66 mL via INTRA_ARTICULAR

## 2023-08-09 MED ORDER — LIDOCAINE HCL 1 % IJ SOLN
3.0000 mL | INTRAMUSCULAR | Status: AC | PRN
Start: 2023-08-09 — End: 2023-08-09
  Administered 2023-08-09: 3 mL

## 2023-08-09 NOTE — Progress Notes (Signed)
Office Visit Note   Patient: Kayla Marshall           Date of Birth: 07-17-1951           MRN: [id number] Visit Date: 08/09/2023              Requested by: Laurann Montana, MD [id number] [place],  [place] [address] PCP: Laurann Montana, MD   Assessment & Plan: Visit Diagnoses:  1. Bilateral primary osteoarthritis of knee     Plan: Impression is bilateral knee osteoarthritis with degenerative medial meniscus tears.  We have discussed repeat cortisone injection today versus viscosupplementation injection versus total knee arthroplasty.  She would like to proceed with cortisone injections today and get approval for gel injections if her symptoms do not improve.  She will call and let us know.  Follow-Up Instructions: Return if symptoms worsen or fail to improve.   Orders:  Orders Placed This Encounter  Procedures   Large Joint Inj: bilateral knee   XR KNEE 3 VIEW LEFT   No orders of the defined types were placed in this encounter.     Procedures: Large Joint Inj: bilateral knee on 08/09/2023 10:29 AM Indications: pain Details: 22 G needle, anterolateral approach Medications (Right): 0.66 mL bupivacaine 0.25 %; 3 mL lidocaine 1 %; 13.33 mg methylPREDNISolone acetate 40 MG/ML Medications (Left): 0.66 mL bupivacaine 0.25 %; 3 mL lidocaine 1 %; 13.33 mg methylPREDNISolone acetate 40 MG/ML      Clinical Data: No additional findings.   Subjective: Chief Complaint  Patient presents with   Left Knee - Pain    HPI patient is a pleasant 72 year old female who comes in today with bilateral knee pain left greater than right.  History of osteoarthritis as well as degenerative meniscal tears previously seen on MRI.  She has undergone cortisone injections with decent relief in the past.  No previous viscosupplementation injection that she can remember.  She has started having increased pain in the left knee after she is taken up ballroom dancing.  The pain is to the  medial aspect and is worse with descending steps as well as when she is dancing.  She has been taking Tylenol without significant relief.  Review of Systems as detailed in HPI.  All others reviewed and are negative.   Objective: Vital Signs: There were no vitals taken for this visit.  Physical Exam well-developed well-nourished female no acute distress.  Alert and oriented x 3.  Ortho Exam bilateral knee exam: Range of motion 0 to 95 degrees.  Moderate patellofemoral crepitus.  Medial joint line tenderness.  She is neurovascular intact distally.  Specialty Comments:  No specialty comments available.  Imaging: XR KNEE 3 VIEW LEFT  Result Date: 08/09/2023 Moderate degenerative changes to the medial and patellofemoral compartments    PMFS History: Patient Active Problem List   Diagnosis Date Noted   Dyspnea on exertion 05/30/2023   Chronic pain of right knee 10/25/2021   Diabetic peripheral neuropathy (HCC) 01/06/2020   Pain of neck with recent traumatic injury 12/12/2019   Deviated septum 10/01/2019   Left hip pain 02/13/2019   Chronic pain of left knee 07/17/2018   Morbid obesity (HCC) 07/17/2018   Body mass index 40.0-44.9, adult (HCC) 07/17/2018   Lumbar spondylosis 04/24/2018   Allergic rhinitis 04/24/2018   Anxiety disorder 04/24/2018   Digital mucous cyst of finger of left hand 10/08/2017   Acute medial meniscus tear of left knee 07/30/2017   OSA on  CPAP 08/26/2015   Essential (primary) hypertension 04/05/2015   Palpitation 08/21/2012   Unspecified hypothyroidism 08/21/2012   Past Medical History:  Diagnosis Date   Allergies    Anemia    Anxiety    Arthritis    back   B12 deficiency    Back pain    Chewing difficulty    Cold intolerance    Depression    Diabetic peripheral neuropathy (HCC) 01/06/2020   Dry mouth    Fatigue    Gallbladder disease    GERD (gastroesophageal reflux disease)    Glaucoma    Hay fever    Headache    Hoarseness     Hypertension    Hypothyroidism    Irritable bowel    Joint pain    Lactose intolerance    Leg cramps    Muscle stiffness    Nervousness    Obesity    Palpitations    PPH (postpartum hemorrhage)    Prediabetes    Sciatic pain    Sciatica    Shortness of breath dyspnea    Sleep apnea    Sleep apnea    Stress    Swelling of lower extremity    Tinnitus    Torn meniscus    left   Vitamin D deficiency    Weakness     Family History  Problem Relation Age of Onset   COPD Mother    Stroke Mother    Hypertension Mother    Anxiety disorder Mother    Heart attack Father    COPD Father    Heart disease Father    Depression Father    Heart disease Sister    Cancer Sister    Breast cancer Sister    Hypertension Brother     Past Surgical History:  Procedure Laterality Date   CHOLECYSTECTOMY  1990   DILATATION & CURETTAGE/HYSTEROSCOPY WITH MYOSURE N/A 11/12/2015   Procedure: DILATATION & CURETTAGE/HYSTEROSCOPY WITH  MYOSURE;  Surgeon: Geryl Rankins, MD;  Location: [facility];  Service: Gynecology;  Laterality: N/A;   Social History   Occupational History   Occupation: Research scientist (life sciences)  Tobacco Use   Smoking status: Never   Smokeless tobacco: Never  Vaping Use   Vaping status: Never Used  Substance and Sexual Activity   Alcohol use: Yes   Drug use: No   Sexual activity: Yes    Birth control/protection: Post-menopausal

## 2023-08-10 ENCOUNTER — Other Ambulatory Visit: Payer: Self-pay | Admitting: Physician Assistant

## 2023-08-10 DIAGNOSIS — M5412 Radiculopathy, cervical region: Secondary | ICD-10-CM

## 2023-08-10 NOTE — Progress Notes (Signed)
Subjective:   Patient ID: Kayla Marshall, female   DOB: 72 y.o.   MRN: [id number]   HPI Patient states that she still has a lot of shooting burning pain in both her feet mostly around the big toes and that this has been going on for a relative extensive period of time.  She has tried gabapentin and other medicines and that has not been something she has been able to tolerate well   ROS      Objective:  Physical Exam  Vascular status intact neurologically appears to be moderate diminishment range of motion good no swelling of the toes slight thickness of the hallux nails possibility for incurvation of the beds with good digital perfusion well-oriented     Assessment:  Appears to be more of a idiopathic neuropathy versus anything with identification to it with possibility of other unknown condition     Plan:  H&P reviewed and at this point I did encourage her to think about taking low-dose gabapentin at night to just see if she can tolerate and it does not cause dizziness and if she can she may be able to increase which would be of benefit.  Do not see any other way I can help her currently

## 2023-08-14 DIAGNOSIS — R7303 Prediabetes: Secondary | ICD-10-CM | POA: Diagnosis not present

## 2023-08-14 DIAGNOSIS — H40023 Open angle with borderline findings, high risk, bilateral: Secondary | ICD-10-CM | POA: Diagnosis not present

## 2023-08-14 DIAGNOSIS — H43813 Vitreous degeneration, bilateral: Secondary | ICD-10-CM | POA: Diagnosis not present

## 2023-08-14 DIAGNOSIS — H04123 Dry eye syndrome of bilateral lacrimal glands: Secondary | ICD-10-CM | POA: Diagnosis not present

## 2023-08-14 DIAGNOSIS — H35371 Puckering of macula, right eye: Secondary | ICD-10-CM | POA: Diagnosis not present

## 2023-08-16 ENCOUNTER — Ambulatory Visit: Payer: Medicare HMO | Admitting: Podiatry

## 2023-08-28 ENCOUNTER — Other Ambulatory Visit: Payer: Self-pay | Admitting: Family Medicine

## 2023-08-28 DIAGNOSIS — Z1231 Encounter for screening mammogram for malignant neoplasm of breast: Secondary | ICD-10-CM

## 2023-09-10 DIAGNOSIS — M545 Low back pain, unspecified: Secondary | ICD-10-CM | POA: Diagnosis not present

## 2023-09-10 DIAGNOSIS — R2689 Other abnormalities of gait and mobility: Secondary | ICD-10-CM | POA: Diagnosis not present

## 2023-09-10 DIAGNOSIS — R2681 Unsteadiness on feet: Secondary | ICD-10-CM | POA: Diagnosis not present

## 2023-09-14 ENCOUNTER — Other Ambulatory Visit: Payer: Medicare HMO

## 2023-09-18 DIAGNOSIS — R2681 Unsteadiness on feet: Secondary | ICD-10-CM | POA: Diagnosis not present

## 2023-09-18 DIAGNOSIS — R2689 Other abnormalities of gait and mobility: Secondary | ICD-10-CM | POA: Diagnosis not present

## 2023-09-18 DIAGNOSIS — M545 Low back pain, unspecified: Secondary | ICD-10-CM | POA: Diagnosis not present

## 2023-09-21 ENCOUNTER — Other Ambulatory Visit: Payer: Self-pay | Admitting: Family Medicine

## 2023-09-21 DIAGNOSIS — E2839 Other primary ovarian failure: Secondary | ICD-10-CM

## 2023-09-24 ENCOUNTER — Ambulatory Visit: Payer: Medicare HMO

## 2023-09-25 DIAGNOSIS — R2681 Unsteadiness on feet: Secondary | ICD-10-CM | POA: Diagnosis not present

## 2023-09-25 DIAGNOSIS — R2689 Other abnormalities of gait and mobility: Secondary | ICD-10-CM | POA: Diagnosis not present

## 2023-09-25 DIAGNOSIS — M545 Low back pain, unspecified: Secondary | ICD-10-CM | POA: Diagnosis not present

## 2023-10-04 ENCOUNTER — Ambulatory Visit
Admission: RE | Admit: 2023-10-04 | Discharge: 2023-10-04 | Disposition: A | Discharge status: Home / Self Care | Payer: Medicare HMO | Source: Ambulatory Visit | Attending: Family Medicine | Admitting: Family Medicine

## 2023-10-04 DIAGNOSIS — Z1231 Encounter for screening mammogram for malignant neoplasm of breast: Secondary | ICD-10-CM | POA: Diagnosis not present

## 2023-10-08 DIAGNOSIS — R2689 Other abnormalities of gait and mobility: Secondary | ICD-10-CM | POA: Diagnosis not present

## 2023-10-08 DIAGNOSIS — R2681 Unsteadiness on feet: Secondary | ICD-10-CM | POA: Diagnosis not present

## 2023-10-08 DIAGNOSIS — M545 Low back pain, unspecified: Secondary | ICD-10-CM | POA: Diagnosis not present

## 2023-10-22 DIAGNOSIS — M545 Low back pain, unspecified: Secondary | ICD-10-CM | POA: Diagnosis not present

## 2023-10-22 DIAGNOSIS — R2689 Other abnormalities of gait and mobility: Secondary | ICD-10-CM | POA: Diagnosis not present

## 2023-10-22 DIAGNOSIS — R2681 Unsteadiness on feet: Secondary | ICD-10-CM | POA: Diagnosis not present

## 2023-10-29 DIAGNOSIS — R2689 Other abnormalities of gait and mobility: Secondary | ICD-10-CM | POA: Diagnosis not present

## 2023-10-29 DIAGNOSIS — M545 Low back pain, unspecified: Secondary | ICD-10-CM | POA: Diagnosis not present

## 2023-10-29 DIAGNOSIS — R2681 Unsteadiness on feet: Secondary | ICD-10-CM | POA: Diagnosis not present

## 2023-12-01 DIAGNOSIS — B9789 Other viral agents as the cause of diseases classified elsewhere: Secondary | ICD-10-CM | POA: Diagnosis not present

## 2023-12-01 DIAGNOSIS — R059 Cough, unspecified: Secondary | ICD-10-CM | POA: Diagnosis not present

## 2023-12-01 DIAGNOSIS — R0981 Nasal congestion: Secondary | ICD-10-CM | POA: Diagnosis not present

## 2023-12-01 DIAGNOSIS — J019 Acute sinusitis, unspecified: Secondary | ICD-10-CM | POA: Diagnosis not present

## 2023-12-25 DIAGNOSIS — G4733 Obstructive sleep apnea (adult) (pediatric): Secondary | ICD-10-CM | POA: Diagnosis not present

## 2023-12-25 DIAGNOSIS — F4310 Post-traumatic stress disorder, unspecified: Secondary | ICD-10-CM | POA: Diagnosis not present

## 2023-12-25 DIAGNOSIS — J309 Allergic rhinitis, unspecified: Secondary | ICD-10-CM | POA: Diagnosis not present

## 2023-12-25 DIAGNOSIS — F419 Anxiety disorder, unspecified: Secondary | ICD-10-CM | POA: Diagnosis not present

## 2023-12-25 DIAGNOSIS — F5101 Primary insomnia: Secondary | ICD-10-CM | POA: Diagnosis not present

## 2023-12-25 DIAGNOSIS — I10 Essential (primary) hypertension: Secondary | ICD-10-CM | POA: Diagnosis not present

## 2024-01-01 DIAGNOSIS — G4733 Obstructive sleep apnea (adult) (pediatric): Secondary | ICD-10-CM | POA: Diagnosis not present

## 2024-01-01 DIAGNOSIS — Z6841 Body Mass Index (BMI) 40.0 and over, adult: Secondary | ICD-10-CM | POA: Diagnosis not present

## 2024-01-01 DIAGNOSIS — I10 Essential (primary) hypertension: Secondary | ICD-10-CM | POA: Diagnosis not present

## 2024-01-01 DIAGNOSIS — E1169 Type 2 diabetes mellitus with other specified complication: Secondary | ICD-10-CM | POA: Diagnosis not present

## 2024-01-01 DIAGNOSIS — Z1331 Encounter for screening for depression: Secondary | ICD-10-CM | POA: Diagnosis not present

## 2024-01-01 DIAGNOSIS — I5189 Other ill-defined heart diseases: Secondary | ICD-10-CM | POA: Diagnosis not present

## 2024-01-01 DIAGNOSIS — G8929 Other chronic pain: Secondary | ICD-10-CM | POA: Diagnosis not present

## 2024-01-01 DIAGNOSIS — E66813 Obesity, class 3: Secondary | ICD-10-CM | POA: Diagnosis not present

## 2024-01-01 DIAGNOSIS — R051 Acute cough: Secondary | ICD-10-CM | POA: Diagnosis not present

## 2024-01-01 DIAGNOSIS — F4310 Post-traumatic stress disorder, unspecified: Secondary | ICD-10-CM | POA: Diagnosis not present

## 2024-01-14 DIAGNOSIS — E65 Localized adiposity: Secondary | ICD-10-CM | POA: Diagnosis not present

## 2024-01-14 DIAGNOSIS — G4733 Obstructive sleep apnea (adult) (pediatric): Secondary | ICD-10-CM | POA: Diagnosis not present

## 2024-01-14 DIAGNOSIS — E1169 Type 2 diabetes mellitus with other specified complication: Secondary | ICD-10-CM | POA: Diagnosis not present

## 2024-01-14 DIAGNOSIS — Z6841 Body Mass Index (BMI) 40.0 and over, adult: Secondary | ICD-10-CM | POA: Diagnosis not present

## 2024-01-14 DIAGNOSIS — F4310 Post-traumatic stress disorder, unspecified: Secondary | ICD-10-CM | POA: Diagnosis not present

## 2024-01-14 DIAGNOSIS — I10 Essential (primary) hypertension: Secondary | ICD-10-CM | POA: Diagnosis not present

## 2024-01-14 DIAGNOSIS — E66813 Obesity, class 3: Secondary | ICD-10-CM | POA: Diagnosis not present

## 2024-01-28 DIAGNOSIS — E1169 Type 2 diabetes mellitus with other specified complication: Secondary | ICD-10-CM | POA: Diagnosis not present

## 2024-01-28 DIAGNOSIS — I10 Essential (primary) hypertension: Secondary | ICD-10-CM | POA: Diagnosis not present

## 2024-01-28 DIAGNOSIS — E65 Localized adiposity: Secondary | ICD-10-CM | POA: Diagnosis not present

## 2024-01-28 DIAGNOSIS — Z6841 Body Mass Index (BMI) 40.0 and over, adult: Secondary | ICD-10-CM | POA: Diagnosis not present

## 2024-01-28 DIAGNOSIS — E66813 Obesity, class 3: Secondary | ICD-10-CM | POA: Diagnosis not present

## 2024-02-06 ENCOUNTER — Ambulatory Visit: Admitting: Orthopaedic Surgery

## 2024-02-06 ENCOUNTER — Encounter: Payer: Self-pay | Admitting: Orthopaedic Surgery

## 2024-02-06 ENCOUNTER — Other Ambulatory Visit: Payer: Self-pay

## 2024-02-06 DIAGNOSIS — G8929 Other chronic pain: Secondary | ICD-10-CM

## 2024-02-06 DIAGNOSIS — M545 Low back pain, unspecified: Secondary | ICD-10-CM

## 2024-02-06 DIAGNOSIS — G4733 Obstructive sleep apnea (adult) (pediatric): Secondary | ICD-10-CM | POA: Diagnosis not present

## 2024-02-06 MED ORDER — TRAMADOL HCL 50 MG PO TABS: 50.0000 mg | ORAL_TABLET | Freq: Every day | ORAL | 0 refills | Status: AC | PRN

## 2024-02-06 MED ORDER — PREDNISONE 10 MG (21) PO TBPK: ORAL_TABLET | ORAL | 3 refills | Status: AC

## 2024-02-06 MED ORDER — METHOCARBAMOL 500 MG PO TABS: 500.0000 mg | ORAL_TABLET | Freq: Four times a day (QID) | ORAL | 2 refills | Status: AC | PRN

## 2024-02-06 NOTE — Progress Notes (Signed)
 Office Visit Note   Patient: Kayla Marshall           Date of Birth: 04/10/1951           MRN: [id number] Visit Date: 02/06/2024              Requested by: Laurann Montana, MD [phone] [address] [place],  [place] [address] PCP: Laurann Montana, MD   Assessment & Plan: Visit Diagnoses:  1. Chronic bilateral low back pain without sciatica     Plan: Kayla Marshall is a 73 year old female with low back pain from facet disease and likely a muscular component as well.  Will send in prescription for prednisone and Robaxin and tramadol and physical therapy.  Follow-up as needed.  Follow-Up Instructions: No follow-ups on file.   Orders:  Orders Placed This Encounter  Procedures   Ambulatory referral to Physical Therapy   Meds ordered this encounter  Medications   predniSONE (STERAPRED UNI-PAK 21 TAB) 10 MG (21) TBPK tablet    Sig: Take as directed    Dispense:  21 tablet    Refill:  3   methocarbamol (ROBAXIN) 500 MG tablet    Sig: Take 1 tablet (500 mg total) by mouth every 6 (six) hours as needed for muscle spasms.    Dispense:  30 tablet    Refill:  2   traMADol (ULTRAM) 50 MG tablet    Sig: Take 1-2 tablets (50-100 mg total) by mouth daily as needed.    Dispense:  20 tablet    Refill:  0      Procedures: No procedures performed   Clinical Data: No additional findings.   Subjective: Chief Complaint  Patient presents with   Lower Back - Pain    HPI Kayla Marshall comes in today for evaluation of low back pain since January.  Feels tightness and radiation into the body.  Denies any injuries.  Feels like she may have lifted something heavy and recently she has been doing a lot of ballroom dancing.  Tylenol is not working.  Meloxicam hurts her stomach.  Denies any radicular symptoms. Review of Systems  Constitutional: Negative.   HENT: Negative.    Eyes: Negative.   Respiratory: Negative.    Cardiovascular: Negative.   Endocrine: Negative.   Musculoskeletal: Negative.    Neurological: Negative.   Hematological: Negative.   Psychiatric/Behavioral: Negative.    All other systems reviewed and are negative.    Objective: Vital Signs: There were no vitals taken for this visit.  Physical Exam Vitals and nursing note reviewed.  Constitutional:      Appearance: She is well-developed.  HENT:     Head: Atraumatic.     Nose: Nose normal.  Eyes:     Extraocular Movements: Extraocular movements intact.  Cardiovascular:     Pulses: Normal pulses.  Pulmonary:     Effort: Pulmonary effort is normal.  Abdominal:     Palpations: Abdomen is soft.  Musculoskeletal:     Cervical back: Neck supple.  Skin:    General: Skin is warm.     Capillary Refill: Capillary refill takes less than 2 seconds.  Neurological:     Mental Status: She is alert. Mental status is at baseline.  Psychiatric:        Behavior: Behavior normal.        Thought Content: Thought content normal.        Judgment: Judgment normal.     Ortho Exam Examination of the lumbar spine  shows pain across the lower portion and into the buttock.  No sciatic tension signs.  No groin pain. Specialty Comments:  No specialty comments available.  Imaging: No results found.   PMFS History: Patient Active Problem List   Diagnosis Date Noted   Dyspnea on exertion 05/30/2023   Chronic pain of right knee 10/25/2021   Diabetic peripheral neuropathy (HCC) 01/06/2020   Pain of neck with recent traumatic injury 12/12/2019   Deviated septum 10/01/2019   Left hip pain 02/13/2019   Chronic pain of left knee 07/17/2018   Morbid obesity (HCC) 07/17/2018   Body mass index 40.0-44.9, adult (HCC) 07/17/2018   Lumbar spondylosis 04/24/2018   Allergic rhinitis 04/24/2018   Anxiety disorder 04/24/2018   Digital mucous cyst of finger of left hand 10/08/2017   Acute medial meniscus tear of left knee 07/30/2017   OSA on CPAP 08/26/2015   Essential (primary) hypertension 04/05/2015   Palpitation 08/21/2012    Hypothyroidism 08/21/2012   Past Medical History:  Diagnosis Date   Allergies    Anemia    Anxiety    Arthritis    back   B12 deficiency    Back pain    Chewing difficulty    Cold intolerance    Depression    Diabetic peripheral neuropathy (HCC) 01/06/2020   Dry mouth    Fatigue    Gallbladder disease    GERD (gastroesophageal reflux disease)    Glaucoma    Hay fever    Headache    Hoarseness    Hypertension    Hypothyroidism    Irritable bowel    Joint pain    Lactose intolerance    Leg cramps    Muscle stiffness    Nervousness    Obesity    Palpitations    PPH (postpartum hemorrhage)    Prediabetes    Sciatic pain    Sciatica    Shortness of breath dyspnea    Sleep apnea    Sleep apnea    Stress    Swelling of lower extremity    Tinnitus    Torn meniscus    left   Vitamin D deficiency    Weakness     Family History  Problem Relation Age of Onset   COPD Mother    Stroke Mother    Hypertension Mother    Anxiety disorder Mother    Heart attack Father    COPD Father    Heart disease Father    Depression Father    Heart disease Sister    Cancer Sister    Breast cancer Sister 35   Hypertension Brother     Past Surgical History:  Procedure Laterality Date   CHOLECYSTECTOMY  1990   DILATATION & CURETTAGE/HYSTEROSCOPY WITH MYOSURE N/A 11/12/2015   Procedure: DILATATION & CURETTAGE/HYSTEROSCOPY WITH  MYOSURE;  Surgeon: Geryl Rankins, MD;  Location: [facility];  Service: Gynecology;  Laterality: N/A;   Social History   Occupational History   Occupation: Research scientist (life sciences)  Tobacco Use   Smoking status: Never   Smokeless tobacco: Never  Vaping Use   Vaping status: Never Used  Substance and Sexual Activity   Alcohol use: Not Currently   Drug use: No   Sexual activity: Yes    Birth control/protection: Post-menopausal

## 2024-02-11 DIAGNOSIS — Z5986 Financial insecurity: Secondary | ICD-10-CM | POA: Diagnosis not present

## 2024-02-11 DIAGNOSIS — I10 Essential (primary) hypertension: Secondary | ICD-10-CM | POA: Diagnosis not present

## 2024-02-11 DIAGNOSIS — Z6841 Body Mass Index (BMI) 40.0 and over, adult: Secondary | ICD-10-CM | POA: Diagnosis not present

## 2024-02-11 DIAGNOSIS — E65 Localized adiposity: Secondary | ICD-10-CM | POA: Diagnosis not present

## 2024-02-11 DIAGNOSIS — E1169 Type 2 diabetes mellitus with other specified complication: Secondary | ICD-10-CM | POA: Diagnosis not present

## 2024-02-11 DIAGNOSIS — E66813 Obesity, class 3: Secondary | ICD-10-CM | POA: Diagnosis not present

## 2024-02-20 DIAGNOSIS — M47816 Spondylosis without myelopathy or radiculopathy, lumbar region: Secondary | ICD-10-CM | POA: Diagnosis not present

## 2024-02-20 DIAGNOSIS — E1169 Type 2 diabetes mellitus with other specified complication: Secondary | ICD-10-CM | POA: Diagnosis not present

## 2024-02-20 DIAGNOSIS — J309 Allergic rhinitis, unspecified: Secondary | ICD-10-CM | POA: Diagnosis not present

## 2024-02-20 DIAGNOSIS — F419 Anxiety disorder, unspecified: Secondary | ICD-10-CM | POA: Diagnosis not present

## 2024-02-20 DIAGNOSIS — F4310 Post-traumatic stress disorder, unspecified: Secondary | ICD-10-CM | POA: Diagnosis not present

## 2024-02-20 DIAGNOSIS — I5189 Other ill-defined heart diseases: Secondary | ICD-10-CM | POA: Diagnosis not present

## 2024-02-20 DIAGNOSIS — G4733 Obstructive sleep apnea (adult) (pediatric): Secondary | ICD-10-CM | POA: Diagnosis not present

## 2024-02-20 DIAGNOSIS — Z0000 Encounter for general adult medical examination without abnormal findings: Secondary | ICD-10-CM | POA: Diagnosis not present

## 2024-02-20 DIAGNOSIS — R0981 Nasal congestion: Secondary | ICD-10-CM | POA: Diagnosis not present

## 2024-02-20 DIAGNOSIS — I10 Essential (primary) hypertension: Secondary | ICD-10-CM | POA: Diagnosis not present

## 2024-02-20 DIAGNOSIS — G629 Polyneuropathy, unspecified: Secondary | ICD-10-CM | POA: Diagnosis not present

## 2024-02-20 DIAGNOSIS — F5101 Primary insomnia: Secondary | ICD-10-CM | POA: Diagnosis not present

## 2024-02-21 DIAGNOSIS — H04123 Dry eye syndrome of bilateral lacrimal glands: Secondary | ICD-10-CM | POA: Diagnosis not present

## 2024-02-21 DIAGNOSIS — H402233 Chronic angle-closure glaucoma, bilateral, severe stage: Secondary | ICD-10-CM | POA: Diagnosis not present

## 2024-02-25 ENCOUNTER — Ambulatory Visit: Admitting: Physical Therapy

## 2024-03-03 DIAGNOSIS — E65 Localized adiposity: Secondary | ICD-10-CM | POA: Diagnosis not present

## 2024-03-03 DIAGNOSIS — Z5986 Financial insecurity: Secondary | ICD-10-CM | POA: Diagnosis not present

## 2024-03-03 DIAGNOSIS — E66813 Obesity, class 3: Secondary | ICD-10-CM | POA: Diagnosis not present

## 2024-03-03 DIAGNOSIS — E1169 Type 2 diabetes mellitus with other specified complication: Secondary | ICD-10-CM | POA: Diagnosis not present

## 2024-03-03 DIAGNOSIS — I10 Essential (primary) hypertension: Secondary | ICD-10-CM | POA: Diagnosis not present

## 2024-03-03 DIAGNOSIS — Z6841 Body Mass Index (BMI) 40.0 and over, adult: Secondary | ICD-10-CM | POA: Diagnosis not present

## 2024-03-17 ENCOUNTER — Ambulatory Visit: Admitting: Physical Therapy

## 2024-03-17 ENCOUNTER — Encounter: Payer: Self-pay | Admitting: Physical Therapy

## 2024-03-17 DIAGNOSIS — R262 Difficulty in walking, not elsewhere classified: Secondary | ICD-10-CM

## 2024-03-17 DIAGNOSIS — M6281 Muscle weakness (generalized): Secondary | ICD-10-CM | POA: Diagnosis not present

## 2024-03-17 DIAGNOSIS — M5459 Other low back pain: Secondary | ICD-10-CM | POA: Diagnosis not present

## 2024-03-17 NOTE — Therapy (Signed)
 OUTPATIENT PHYSICAL THERAPY THORACOLUMBAR EVALUATION   Patient Name: Kayla Marshall MRN: [id number] DOB:1951-09-14, 73 y.o., female Today's Date: 03/17/2024  END OF SESSION:  PT End of Session - 03/17/24 1610     Visit Number 1    Number of Visits 12    Date for PT Re-Evaluation 05/26/24    Authorization Type [employer] MCR    PT Start Time 1430    PT Stop Time 1515    PT Time Calculation (min) 45 min    Activity Tolerance Patient tolerated treatment well    Behavior During Therapy [facility] for tasks assessed/performed             Past Medical History:  Diagnosis Date   Allergies    Anemia    Anxiety    Arthritis    back   B12 deficiency    Back pain    Chewing difficulty    Cold intolerance    Depression    Diabetic peripheral neuropathy (HCC) 01/06/2020   Dry mouth    Fatigue    Gallbladder disease    GERD (gastroesophageal reflux disease)    Glaucoma    Hay fever    Headache    Hoarseness    Hypertension    Hypothyroidism    Irritable bowel    Joint pain    Lactose intolerance    Leg cramps    Muscle stiffness    Nervousness    Obesity    Palpitations    PPH (postpartum hemorrhage)    Prediabetes    Sciatic pain    Sciatica    Shortness of breath dyspnea    Sleep apnea    Sleep apnea    Stress    Swelling of lower extremity    Tinnitus    Torn meniscus    left   Vitamin D  deficiency    Weakness    Past Surgical History:  Procedure Laterality Date   CHOLECYSTECTOMY  1990   DILATATION & CURETTAGE/HYSTEROSCOPY WITH MYOSURE N/A 11/12/2015   Procedure: DILATATION & CURETTAGE/HYSTEROSCOPY WITH  MYOSURE;  Surgeon: Johnn Najjar, MD;  Location: [facility];  Service: Gynecology;  Laterality: N/A;   Patient Active Problem List   Diagnosis Date Noted   Dyspnea on exertion 05/30/2023   Chronic pain of right knee 10/25/2021   Diabetic peripheral neuropathy (HCC) 01/06/2020   Pain of neck with recent traumatic injury 12/12/2019   Deviated septum 10/01/2019    Left hip pain 02/13/2019   Chronic pain of left knee 07/17/2018   Morbid obesity (HCC) 07/17/2018   Body mass index 40.0-44.9, adult (HCC) 07/17/2018   Lumbar spondylosis 04/24/2018   Allergic rhinitis 04/24/2018   Anxiety disorder 04/24/2018   Digital mucous cyst of finger of left hand 10/08/2017   Acute medial meniscus tear of left knee 07/30/2017   OSA on CPAP 08/26/2015   Essential (primary) hypertension 04/05/2015   Palpitation 08/21/2012   Hypothyroidism 08/21/2012    PCP: Victorio Grave, MD   REFERRING PROVIDER: Wes Hamman, MD   REFERRING DIAG: M54.50,G89.29 (ICD-10-CM) - Chronic bilateral low back pain without sciatica   Rationale for Evaluation and Treatment: Rehabilitation  THERAPY DIAG:  Other low back pain  Muscle weakness (generalized)  Difficulty in walking, not elsewhere classified  ONSET DATE: Chronic back pain, worse since January 2025  SUBJECTIVE:  SUBJECTIVE STATEMENT: She relays chronic back pain but worse since January 2025. She had bad sinus infection and was not able to move much then and this started bothering her. She used to be able walk 7,000 steps per day but now can only walk about 4,000 steps per day. Stress makes the pain worse.   PERTINENT HISTORY: 2 torn meniscus, neruopathy, disc bluges and stenosis   PAIN:  NPRS scale: 8/10 Pain location: Center of lower back, left hip Pain description: tight, burning, Aggravating factors: bending over, sitting too much, cleaning, lifting Relieving factors: lidocane, tyelonol, sitting just not too long  PRECAUTIONS: None  WEIGHT BEARING RESTRICTIONS: No  FALLS:  Has patient fallen in last 6 months? No  OCCUPATION: retired, does do Nurse, mental health work  PLOF: Independent  PATIENT GOALS: She wants to be  able to dance without the pain  Next MD Visit: nothing scheduled at eval   OBJECTIVE:   DIAGNOSTIC FINDINGS:  No recent images of spine, MRI ordered for neck, Previous MRI 2021 IMPRESSION: Multilevel degenerative changes of the lumbar spine, worst at L4-L5 where there is moderate spinal canal stenosis, narrowing of the bilateral subarticular zones, and mild bilateral neural foraminal narrowing, progressed since prior MRI. Disc bulges noted  PATIENT SURVEYS:  Patient-Specific Activity Scoring Scheme  "0" represents "unable to perform." "10" represents "able to perform at prior level. 0 1 2 3 4 5 6 7 8 9  10 (Date and Score)   Activity Eval  02/25/24    1. Bending/lifting 3     2. dancing 6     3. stairs 5   4.getting up from floor 1   5.walking up to 7,000 steps per day 1   Score 3.2    Total score = sum of the activity scores/number of activities Minimum detectable change (90%CI) for average score = 2 points Minimum detectable change (90%CI) for single activity score = 3 points  SCREENING FOR RED FLAGS: Bowel or bladder incontinence: No Cauda equina syndrome: No  COGNITION: Overall cognitive status: WFL normal      LUMBAR ROM:   Directional Preference Assessment: Centralization:none with repeated flexion or extension Peripheralization: some with repeated extension, even more with repeated flexion  AROM Eval 02/25/24  Flexion 80%  Extension 25%  Right lateral flexion 50%  Left lateral flexion 50%  Right rotation 50%  Left rotation 50%   (Blank rows = not tested)  LOWER EXTREMITY Strength MMT:       Right Eval 02/25/24 Left Eval 02/25/24  Hip flexion 4 4  Hip extension    Hip abduction 4 4  Hip adduction    Hip internal rotation    Hip external rotation    Knee flexion 5 5  Knee extension 4 4  Ankle dorsiflexion    Ankle plantarflexion    Ankle inversion    Ankle eversion     (Blank rows = not tested)   LUMBAR SPECIAL TESTS:  Straight leg raise  test: Positive and Slump test: Positive  TODAY'S TREATMENT:                                                                                                         DATE:  02/25/24  Therex:    HEP instruction/performance c cues for techniques, handout provided.  Trial set performed of each for comprehension and symptom assessment.  See below for exercise list  IFC electrical stimulation X 12 min to low back and hips with cold pack during sitting  PATIENT EDUCATION:  Education details: HEP, POC Person educated: Patient Education method: Explanation, Demonstration, Verbal cues, and Handouts Education comprehension: verbalized understanding, returned demonstration, and verbal cues required  HOME EXERCISE PROGRAM: Access Code: [email or web address] URL: [email or web address] Date: 03/17/2024 Prepared by: Jamee Mazzoni  Exercises - Standing Hip Flexor Stretch  - 2 x daily - 6 x weekly - 1 sets - 3 reps - 15-20 sec hold - Standing Lumbar Extension at Wall - Forearms  - 2 x daily - 6 x weekly - 1 sets - 10 reps - 3 sec hold - Standing Hip Abduction with Resistance at Ankles and Counter Support  - 2 x daily - 6 x weekly - 1-2 sets - 10 reps - Standing Hip Extension with Resistance at Ankles and Unilateral Counter Support  - 2 x daily - 6 x weekly - 1-2 sets - 10 reps - Slump Stretch  - 2 x daily - 6 x weekly - 1-2 sets - 10 reps - 3 hold  ASSESSMENT:  CLINICAL IMPRESSION: Patient is a 73 y.o. who comes to clinic with complaints of low back pain with mobility, strength and movement coordination deficits that impair their ability to perform usual daily and recreational functional activities without increase difficulty/symptoms at this time.  Patient to benefit from skilled PT services to  address impairments and limitations to improve to previous level of function without restriction secondary to condition.   OBJECTIVE IMPAIRMENTS: Abnormal gait, decreased balance, decreased coordination, decreased endurance, decreased mobility, difficulty walking, decreased ROM, decreased strength, impaired flexibility, obesity, and pain.   ACTIVITY LIMITATIONS: carrying, lifting, bending, sitting, standing, squatting, sleeping, stairs, transfers, and bed mobility  PARTICIPATION LIMITATIONS: meal prep, cleaning, laundry, community activity, and occupation  PERSONAL FACTORS: Age, Fitness, Past/current experiences, and 3+ comorbidities:    are also affecting patient's functional outcome.   REHAB POTENTIAL: Good  CLINICAL DECISION MAKING: Evolving/moderate complexity  EVALUATION COMPLEXITY: Moderate   GOALS: Goals reviewed with patient? Yes  SHORT TERM GOALS: (target date for Short term goals are 3 weeks 04/07/2024  )  1. Patient will demonstrate independent use of home exercise program to maintain progress from in clinic treatments.  Goal status: New  LONG TERM GOALS: (target dates for all long term goals are 10 weeks  05/26/2024  )   1. Patient will demonstrate/report pain at worst less than or equal to 4/10 to facilitate minimal limitation in daily activity secondary to pain symptoms.  Goal status: New   2. Patient will demonstrate independent use of home exercise program to facilitate ability to maintain/progress functional gains from skilled physical  therapy services.  Goal status: New   3. Patient will demonstrate Patient specific functional scale avg > or = 6/10 to indicate reduced disability due to condition.   Goal status: New   4. Patient will demonstrate lumbar ROM at least  75% for [facility] ROM  to facilitate upright standing, walking posture at PLOF s limitation.  Goal status: New   5. Patient will report she is able to dance longer with less overall pain from when  she started pain.  PLAN:  PT FREQUENCY: 1-2x/week  PT DURATION: 10 weeks  PLANNED INTERVENTIONS: Can include 29528- PT Re-evaluation, 97110-Therapeutic exercises, 97530- Therapeutic activity, 97112- Neuromuscular re-education, 97535- Self Care, 97140- Manual therapy, [phone]- Gait training, [phone]- Orthotic Fit/training, [phone]- Canalith repositioning, [id number]- Aquatic Therapy, [phone]- Electrical stimulation (unattended), 97750 Physical performance testing, [id number]- Electrical stimulation (manual), 97016- Vasopneumatic device, [id number]- Ultrasound, [id number]- Traction (mechanical), [id number]- Ionotophoresis 4mg /ml Dexamethasone , Patient/Family education, Balance training, Stair training, Taping, Dry Needling, Joint mobilization, Joint manipulation, Spinal manipulation, Spinal mobilization, Scar mobilization, Vestibular training, Visual/preceptual remediation/compensation, DME instructions, Cryotherapy, and Moist heat.  All performed as medically necessary.  All included unless contraindicated  PLAN FOR NEXT SESSION: Review HEP knowledge/results. Core strengthening, mobilizations and manual as needed. She is interested in DN but declined this first day.     Mick Alamin, PT, DPT 03/17/2024, 4:11 PM

## 2024-03-18 NOTE — Therapy (Signed)
 OUTPATIENT PHYSICAL THERAPY TREATMENT   Patient Name: Kayla Marshall MRN: [id number] DOB:[birth year], 73 y.o., female Today's Date: 03/19/2024  END OF SESSION:  PT End of Session - 03/19/24 1107     Visit Number 2    Number of Visits 12    Date for PT Re-Evaluation 05/26/24    Authorization Type [employer] MCR    PT Start Time 1107    PT Stop Time 1149    PT Time Calculation (min) 42 min    Activity Tolerance Patient limited by pain    Behavior During Therapy [facility] for tasks assessed/performed              Past Medical History:  Diagnosis Date   Allergies    Anemia    Anxiety    Arthritis    back   B12 deficiency    Back pain    Chewing difficulty    Cold intolerance    Depression    Diabetic peripheral neuropathy (HCC) 01/06/2020   Dry mouth    Fatigue    Gallbladder disease    GERD (gastroesophageal reflux disease)    Glaucoma    Hay fever    Headache    Hoarseness    Hypertension    Hypothyroidism    Irritable bowel    Joint pain    Lactose intolerance    Leg cramps    Muscle stiffness    Nervousness    Obesity    Palpitations    PPH (postpartum hemorrhage)    Prediabetes    Sciatic pain    Sciatica    Shortness of breath dyspnea    Sleep apnea    Sleep apnea    Stress    Swelling of lower extremity    Tinnitus    Torn meniscus    left   Vitamin D  deficiency    Weakness    Past Surgical History:  Procedure Laterality Date   CHOLECYSTECTOMY  1990   DILATATION & CURETTAGE/HYSTEROSCOPY WITH MYOSURE N/A 11/12/2015   Procedure: DILATATION & CURETTAGE/HYSTEROSCOPY WITH  MYOSURE;  Surgeon: Johnn Najjar, MD;  Location: [facility];  Service: Gynecology;  Laterality: N/A;   Patient Active Problem List   Diagnosis Date Noted   Dyspnea on exertion 05/30/2023   Chronic pain of right knee 10/25/2021   Diabetic peripheral neuropathy (HCC) 01/06/2020   Pain of neck with recent traumatic injury 12/12/2019   Deviated septum 10/01/2019   Left hip pain  02/13/2019   Chronic pain of left knee 07/17/2018   Morbid obesity (HCC) 07/17/2018   Body mass index 40.0-44.9, adult (HCC) 07/17/2018   Lumbar spondylosis 04/24/2018   Allergic rhinitis 04/24/2018   Anxiety disorder 04/24/2018   Digital mucous cyst of finger of left hand 10/08/2017   Acute medial meniscus tear of left knee 07/30/2017   OSA on CPAP 08/26/2015   Essential (primary) hypertension 04/05/2015   Palpitation 08/21/2012   Hypothyroidism 08/21/2012    PCP: Victorio Grave, MD   REFERRING PROVIDER: Wes Hamman, MD   REFERRING DIAG: M54.50,G89.29 (ICD-10-CM) - Chronic bilateral low back pain without sciatica   Rationale for Evaluation and Treatment: Rehabilitation  THERAPY DIAG:  Other low back pain  Muscle weakness (generalized)  Difficulty in walking, not elsewhere classified  ONSET DATE: Chronic back pain, worse since January 2025  SUBJECTIVE:  SUBJECTIVE STATEMENT: Pt indicated tightness in back.  Pt indicated that was about the same as before.  Pt indicated worse during the day usually.   PERTINENT HISTORY: 2 torn meniscus, neruopathy, disc bluges and stenosis   PAIN:  NPRS scale: upon arrival 6/10 Pain location: Center of lower back, left hip Pain description: tight, burning, Aggravating factors: bending over, sitting too much, cleaning, lifting Relieving factors: lidocane, tyelonol, sitting just not too long  PRECAUTIONS: None  WEIGHT BEARING RESTRICTIONS: No  FALLS:  Has patient fallen in last 6 months? No  OCCUPATION: retired, does do Nurse, mental health work  PLOF: Independent  PATIENT GOALS: She wants to be able to dance without the pain  Next MD Visit: nothing scheduled at eval   OBJECTIVE:   DIAGNOSTIC FINDINGS:  No recent images of spine, MRI  ordered for neck, Previous MRI 2021 IMPRESSION: Multilevel degenerative changes of the lumbar spine, worst at L4-L5 where there is moderate spinal canal stenosis, narrowing of the bilateral subarticular zones, and mild bilateral neural foraminal narrowing, progressed since prior MRI. Disc bulges noted  PATIENT SURVEYS:  Patient-Specific Activity Scoring Scheme  "0" represents "unable to perform." "10" represents "able to perform at prior level. 0 1 2 3 4 5 6 7 8 9  10 (Date and Score)   Activity Eval  02/25/24    1. Bending/lifting 3     2. dancing 6     3. stairs 5   4.getting up from floor 1   5.walking up to 7,000 steps per day 1   Score 3.2    Total score = sum of the activity scores/number of activities Minimum detectable change (90%CI) for average score = 2 points Minimum detectable change (90%CI) for single activity score = 3 points  SCREENING FOR RED FLAGS: Bowel or bladder incontinence: No Cauda equina syndrome: No  COGNITION: Overall cognitive status: WFL normal      LUMBAR ROM:   Directional Preference Assessment: Centralization:none with repeated flexion or extension Peripheralization: some with repeated extension, even more with repeated flexion  AROM Eval 02/25/24  Flexion 80%  Extension 25%  Right lateral flexion 50%  Left lateral flexion 50%  Right rotation 50%  Left rotation 50%   (Blank rows = not tested)  LOWER EXTREMITY Strength MMT:       Right Eval 02/25/24 Left Eval 02/25/24  Hip flexion 4 4  Hip extension    Hip abduction 4 4  Hip adduction    Hip internal rotation    Hip external rotation    Knee flexion 5 5  Knee extension 4 4  Ankle dorsiflexion    Ankle plantarflexion    Ankle inversion    Ankle eversion     (Blank rows = not tested)   LUMBAR SPECIAL TESTS:  02/25/24 Straight leg raise test: Positive and Slump test: Positive  TODAY'S TREATMENT:                                                                                                         DATE:  03/18/2024  Therex: Nustep lvl 5 UE/LE 8 mins for mobility and aerobic exercise intervention.   Performed education on aerobic exercise intervention and benefits.  Standing lumbar extension AROM review with performance x 5  Standing hip abduction green band x 10 bilateral (provided red at home due to level of challenge with green) Standing hip extension green band x 10 bilateral (provided red at home due to level of challenge with green) Supine lumbar trunk rotation 15 sec x 3 bilateral Supine figure 4 pull towards 15 sec x 3 bilateral. Supine bridge x 10   Additional time spent in review of exercise techniques and positioning with plenty of Q and A time with patient.      TODAY'S TREATMENT:                                                                                                         DATE:  02/25/24  Therex:    HEP instruction/performance c cues for techniques, handout provided.  Trial set performed of each for comprehension and symptom assessment.  See below for exercise list  IFC electrical stimulation X 12 min to low back and hips with cold pack during sitting  PATIENT EDUCATION:  03/19/2024 Education details: HEP update Person educated: Patient Education method: Programmer, multimedia, Demonstration, Verbal cues, and Handouts Education comprehension: verbalized understanding, returned demonstration, and verbal cues required  HOME EXERCISE PROGRAM: Access Code: [email or web address] URL: [email or web address] Date: 03/19/2024 Prepared by: Bonna Bustard  Exercises - Standing Lumbar Extension with Counter  - 3-5 x daily - 7 x weekly - 1 sets - 5-10 reps - Supine Lower Trunk Rotation  - 2-3 x daily - 7 x weekly - 1 sets - 3-5 reps - 15 hold -  Supine Piriformis Stretch with Foot on Ground  - 2 x daily - 7 x weekly - 1 sets - 3-5 reps - 15 hold - Standing Hip Flexor Stretch  - 2 x daily - 6 x weekly - 1 sets - 3 reps - 15-20 sec hold - Standing Hip Abduction with Resistance at Ankles and Counter Support  - 2 x daily - 6 x weekly - 1-2 sets - 10 reps - Standing Hip Extension with Resistance at Ankles and Unilateral Counter Support  - 2 x daily - 6 x weekly - 1-2 sets - 10 reps  ASSESSMENT:  CLINICAL IMPRESSION: Spent time in review and adjustment of exercises based of patient preference with  goals for mobility gains and hip strengthening as noted in evaluation.   Pt deferred dry needling due to symptoms reported.   Pt may continue to benefit from skilled PT services at this time to continue to address pain complaints.     OBJECTIVE IMPAIRMENTS: Abnormal gait, decreased balance, decreased coordination, decreased endurance, decreased mobility, difficulty walking, decreased ROM, decreased strength, impaired flexibility, obesity, and pain.   ACTIVITY LIMITATIONS: carrying, lifting, bending, sitting, standing, squatting, sleeping, stairs, transfers, and bed mobility  PARTICIPATION LIMITATIONS: meal prep, cleaning, laundry, community activity, and occupation  PERSONAL FACTORS: Age, Fitness, Past/current experiences, and 3+ comorbidities:    are also affecting patient's functional outcome.   REHAB POTENTIAL: Good  CLINICAL DECISION MAKING: Evolving/moderate complexity  EVALUATION COMPLEXITY: Moderate   GOALS: Goals reviewed with patient? Yes  SHORT TERM GOALS: (target date for Short term goals are 3 weeks 04/07/2024)  1. Patient will demonstrate independent use of home exercise program to maintain progress from in clinic treatments.  Goal status: on going 03/19/2024  LONG TERM GOALS: (target dates for all long term goals are 10 weeks  05/26/2024)   1. Patient will demonstrate/report pain at worst less than or equal to 4/10 to  facilitate minimal limitation in daily activity secondary to pain symptoms.  Goal status: New   2. Patient will demonstrate independent use of home exercise program to facilitate ability to maintain/progress functional gains from skilled physical therapy services.  Goal status: New   3. Patient will demonstrate Patient specific functional scale avg > or = 6/10 to indicate reduced disability due to condition.   Goal status: New   4. Patient will demonstrate lumbar ROM at least  75% for [facility] ROM  to facilitate upright standing, walking posture at PLOF s limitation.  Goal status: New   5. Patient will report she is able to dance longer with less overall pain from when she started pain.  PLAN:  PT FREQUENCY: 1-2x/week  PT DURATION: 10 weeks  PLANNED INTERVENTIONS: Can include 54098- PT Re-evaluation, 97110-Therapeutic exercises, 97530- Therapeutic activity, 97112- Neuromuscular re-education, 97535- Self Care, 97140- Manual therapy, [phone]- Gait training, [phone]- Orthotic Fit/training, [phone]- Canalith repositioning, [id number]- Aquatic Therapy, [phone]- Electrical stimulation (unattended), 97750 Physical performance testing, [id number]- Electrical stimulation (manual), 97016- Vasopneumatic device, [id number]- Ultrasound, [id number]- Traction (mechanical), [id number]- Ionotophoresis 4mg /ml Dexamethasone , Patient/Family education, Balance training, Stair training, Taping, Dry Needling, Joint mobilization, Joint manipulation, Spinal manipulation, Spinal mobilization, Scar mobilization, Vestibular training, Visual/preceptual remediation/compensation, DME instructions, Cryotherapy, and Moist heat.  All performed as medically necessary.  All included unless contraindicated  PLAN FOR NEXT SESSION: Progress lumbar mobility and hip strengthening.    Bonna Bustard, PT, DPT, OCS, ATC 03/19/24  11:59 AM

## 2024-03-19 ENCOUNTER — Ambulatory Visit: Admitting: Rehabilitative and Restorative Service Providers"

## 2024-03-19 ENCOUNTER — Encounter: Payer: Self-pay | Admitting: Rehabilitative and Restorative Service Providers"

## 2024-03-19 DIAGNOSIS — M6281 Muscle weakness (generalized): Secondary | ICD-10-CM

## 2024-03-19 DIAGNOSIS — M5459 Other low back pain: Secondary | ICD-10-CM | POA: Diagnosis not present

## 2024-03-19 DIAGNOSIS — R262 Difficulty in walking, not elsewhere classified: Secondary | ICD-10-CM

## 2024-03-24 ENCOUNTER — Encounter: Admitting: Physical Therapy

## 2024-03-25 DIAGNOSIS — H01005 Unspecified blepharitis left lower eyelid: Secondary | ICD-10-CM | POA: Diagnosis not present

## 2024-03-25 DIAGNOSIS — H00015 Hordeolum externum left lower eyelid: Secondary | ICD-10-CM | POA: Diagnosis not present

## 2024-03-28 ENCOUNTER — Ambulatory Visit: Admitting: Physical Therapy

## 2024-03-28 DIAGNOSIS — R262 Difficulty in walking, not elsewhere classified: Secondary | ICD-10-CM

## 2024-03-28 DIAGNOSIS — M6281 Muscle weakness (generalized): Secondary | ICD-10-CM

## 2024-03-28 DIAGNOSIS — M5459 Other low back pain: Secondary | ICD-10-CM

## 2024-03-28 NOTE — Therapy (Signed)
 OUTPATIENT PHYSICAL THERAPY TREATMENT   Patient Name: Kayla Marshall MRN: [id number] DOB:[birth year], 73 y.o., female Today's Date: 03/28/2024  END OF SESSION:  PT End of Session - 03/28/24 1306     Visit Number 3    Number of Visits 12    Date for PT Re-Evaluation 05/26/24    Authorization Type [employer] MCR    PT Start Time 1306   late sign in   PT Stop Time 1345    PT Time Calculation (min) 39 min    Activity Tolerance Patient limited by pain    Behavior During Therapy [facility] for tasks assessed/performed              Past Medical History:  Diagnosis Date   Allergies    Anemia    Anxiety    Arthritis    back   B12 deficiency    Back pain    Chewing difficulty    Cold intolerance    Depression    Diabetic peripheral neuropathy (HCC) 01/06/2020   Dry mouth    Fatigue    Gallbladder disease    GERD (gastroesophageal reflux disease)    Glaucoma    Hay fever    Headache    Hoarseness    Hypertension    Hypothyroidism    Irritable bowel    Joint pain    Lactose intolerance    Leg cramps    Muscle stiffness    Nervousness    Obesity    Palpitations    PPH (postpartum hemorrhage)    Prediabetes    Sciatic pain    Sciatica    Shortness of breath dyspnea    Sleep apnea    Sleep apnea    Stress    Swelling of lower extremity    Tinnitus    Torn meniscus    left   Vitamin D  deficiency    Weakness    Past Surgical History:  Procedure Laterality Date   CHOLECYSTECTOMY  1990   DILATATION & CURETTAGE/HYSTEROSCOPY WITH MYOSURE N/A 11/12/2015   Procedure: DILATATION & CURETTAGE/HYSTEROSCOPY WITH  MYOSURE;  Surgeon: Johnn Najjar, MD;  Location: [facility];  Service: Gynecology;  Laterality: N/A;   Patient Active Problem List   Diagnosis Date Noted   Dyspnea on exertion 05/30/2023   Chronic pain of right knee 10/25/2021   Diabetic peripheral neuropathy (HCC) 01/06/2020   Pain of neck with recent traumatic injury 12/12/2019   Deviated septum 10/01/2019   Left hip  pain 02/13/2019   Chronic pain of left knee 07/17/2018   Morbid obesity (HCC) 07/17/2018   Body mass index 40.0-44.9, adult (HCC) 07/17/2018   Lumbar spondylosis 04/24/2018   Allergic rhinitis 04/24/2018   Anxiety disorder 04/24/2018   Digital mucous cyst of finger of left hand 10/08/2017   Acute medial meniscus tear of left knee 07/30/2017   OSA on CPAP 08/26/2015   Essential (primary) hypertension 04/05/2015   Palpitation 08/21/2012   Hypothyroidism 08/21/2012    PCP: Victorio Grave, MD   REFERRING PROVIDER: Victorio Grave, MD   REFERRING DIAG: M54.50,G89.29 (ICD-10-CM) - Chronic bilateral low back pain without sciatica   Rationale for Evaluation and Treatment: Rehabilitation  THERAPY DIAG:  Other low back pain  Muscle weakness (generalized)  Difficulty in walking, not elsewhere classified  ONSET DATE: Chronic back pain, worse since January 2025  SUBJECTIVE:  SUBJECTIVE STATEMENT: Pt states she has not been doing the exercises regularly. Reports that the band exercises hurt and she has not been able to do the piriformis stretch since it twisted her knee. Pt reports not feeling so well due to taking some new medication.  PERTINENT HISTORY: 2 torn meniscus, neruopathy, disc bluges and stenosis   PAIN:  NPRS scale: upon arrival 6/10 Pain location: Center of lower back, left hip Pain description: tight, burning, Aggravating factors: bending over, sitting too much, cleaning, lifting Relieving factors: lidocane, tyelonol, sitting just not too long  PRECAUTIONS: None  WEIGHT BEARING RESTRICTIONS: No  FALLS:  Has patient fallen in last 6 months? No  OCCUPATION: retired, does do Nurse, mental health work  PLOF: Independent  PATIENT GOALS: She wants to be able to dance without the  pain  Next MD Visit: nothing scheduled at eval   OBJECTIVE:   DIAGNOSTIC FINDINGS:  No recent images of spine, MRI ordered for neck, Previous MRI 2021 IMPRESSION: Multilevel degenerative changes of the lumbar spine, worst at L4-L5 where there is moderate spinal canal stenosis, narrowing of the bilateral subarticular zones, and mild bilateral neural foraminal narrowing, progressed since prior MRI. Disc bulges noted  PATIENT SURVEYS:  Patient-Specific Activity Scoring Scheme  "0" represents "unable to perform." "10" represents "able to perform at prior level. 0 1 2 3 4 5 6 7 8 9  10 (Date and Score)   Activity Eval  02/25/24    1. Bending/lifting 3     2. dancing 6     3. stairs 5   4.getting up from floor 1   5.walking up to 7,000 steps per day 1   Score 3.2    Total score = sum of the activity scores/number of activities Minimum detectable change (90%CI) for average score = 2 points Minimum detectable change (90%CI) for single activity score = 3 points  SCREENING FOR RED FLAGS: Bowel or bladder incontinence: No Cauda equina syndrome: No  COGNITION: Overall cognitive status: WFL normal      LUMBAR ROM:   Directional Preference Assessment: Centralization:none with repeated flexion or extension Peripheralization: some with repeated extension, even more with repeated flexion  AROM Eval 02/25/24  Flexion 80%  Extension 25%  Right lateral flexion 50%  Left lateral flexion 50%  Right rotation 50%  Left rotation 50%   (Blank rows = not tested)  LOWER EXTREMITY Strength MMT:       Right Eval 02/25/24 Left Eval 02/25/24  Hip flexion 4 4  Hip extension    Hip abduction 4 4  Hip adduction    Hip internal rotation    Hip external rotation    Knee flexion 5 5  Knee extension 4 4  Ankle dorsiflexion    Ankle plantarflexion    Ankle inversion    Ankle eversion     (Blank rows = not tested)   LUMBAR SPECIAL TESTS:  02/25/24 Straight leg raise test: Positive and  Slump test: Positive  TODAY'S TREATMENT:                                                                                                         DATE: 03/28/2024  Therex: Seated hamstring stretch x30" Supine figure 4 stretch 2x30" Supine hip flexor stretch 2x30" Supine LTR 3x15" Sidelying clamshell red TB 2x10 Supine PPT + ab set 2x10 (attempted with ball but pt had difficulty coordinating) Supine bridge x10 Nustep L5 UEs/LEs x 10 min    TODAY'S TREATMENT:                                                                                                         DATE:  03/18/2024  Therex: Nustep lvl 5 UE/LE 8 mins for mobility and aerobic exercise intervention.   Performed education on aerobic exercise intervention and benefits.  Standing lumbar extension AROM review with performance x 5  Standing hip abduction green band x 10 bilateral (provided red at home due to level of challenge with green) Standing hip extension green band x 10 bilateral (provided red at home due to level of challenge with green) Supine lumbar trunk rotation 15 sec x 3 bilateral Supine figure 4 pull towards 15 sec x 3 bilateral. Supine bridge x 10   Additional time spent in review of exercise techniques and positioning with plenty of Q and A time with patient.      TODAY'S TREATMENT:                                                                                                         DATE:  02/25/24  Therex:    HEP instruction/performance c cues for techniques, handout provided.  Trial set performed of each for comprehension and symptom assessment.  See below for exercise list  IFC electrical stimulation X 12 min to low back and hips with cold pack during sitting  PATIENT EDUCATION:  03/19/2024 Education  details: HEP update Person educated: Patient Education method: Programmer, multimedia, Demonstration, Verbal cues, and Handouts Education comprehension: verbalized understanding, returned demonstration, and verbal cues required  HOME EXERCISE PROGRAM: Access Code: [email or web address] URL: [email or web address] Date: 03/19/2024 Prepared by: Bonna Bustard  Exercises - Standing Lumbar Extension with Counter  -  3-5 x daily - 7 x weekly - 1 sets - 5-10 reps - Supine Lower Trunk Rotation  - 2-3 x daily - 7 x weekly - 1 sets - 3-5 reps - 15 hold - Supine Piriformis Stretch with Foot on Ground  - 2 x daily - 7 x weekly - 1 sets - 3-5 reps - 15 hold - Standing Hip Flexor Stretch  - 2 x daily - 6 x weekly - 1 sets - 3 reps - 15-20 sec hold - Standing Hip Abduction with Resistance at Ankles and Counter Support  - 2 x daily - 6 x weekly - 1-2 sets - 10 reps - Standing Hip Extension with Resistance at Ankles and Unilateral Counter Support  - 2 x daily - 6 x weekly - 1-2 sets - 10 reps  ASSESSMENT:  CLINICAL IMPRESSION: Reviewed and adjusted HEP. Worked on initiating core stabilization/activation this session along with continued hip strengthening and stretching.     OBJECTIVE IMPAIRMENTS: Abnormal gait, decreased balance, decreased coordination, decreased endurance, decreased mobility, difficulty walking, decreased ROM, decreased strength, impaired flexibility, obesity, and pain.   ACTIVITY LIMITATIONS: carrying, lifting, bending, sitting, standing, squatting, sleeping, stairs, transfers, and bed mobility  PARTICIPATION LIMITATIONS: meal prep, cleaning, laundry, community activity, and occupation  PERSONAL FACTORS: Age, Fitness, Past/current experiences, and 3+ comorbidities:   are also affecting patient's functional outcome.   REHAB POTENTIAL: Good  CLINICAL DECISION MAKING: Evolving/moderate complexity  EVALUATION COMPLEXITY: Moderate   GOALS: Goals reviewed with patient? Yes  SHORT TERM  GOALS: (target date for Short term goals are 3 weeks 04/07/2024)  1. Patient will demonstrate independent use of home exercise program to maintain progress from in clinic treatments.  Goal status: on going 03/19/2024  LONG TERM GOALS: (target dates for all long term goals are 10 weeks  05/26/2024)   1. Patient will demonstrate/report pain at worst less than or equal to 4/10 to facilitate minimal limitation in daily activity secondary to pain symptoms.  Goal status: New   2. Patient will demonstrate independent use of home exercise program to facilitate ability to maintain/progress functional gains from skilled physical therapy services.  Goal status: New   3. Patient will demonstrate Patient specific functional scale avg > or = 6/10 to indicate reduced disability due to condition.   Goal status: New   4. Patient will demonstrate lumbar ROM at least  75% for [facility] ROM  to facilitate upright standing, walking posture at PLOF s limitation.  Goal status: New   5. Patient will report she is able to dance longer with less overall pain from when she started pain.  PLAN:  PT FREQUENCY: 1-2x/week  PT DURATION: 10 weeks  PLANNED INTERVENTIONS: Can include 16109- PT Re-evaluation, 97110-Therapeutic exercises, 97530- Therapeutic activity, 97112- Neuromuscular re-education, 97535- Self Care, 97140- Manual therapy, [phone]- Gait training, [phone]- Orthotic Fit/training, [phone]- Canalith repositioning, [id number]- Aquatic Therapy, [phone]- Electrical stimulation (unattended), 97750 Physical performance testing, [id number]- Electrical stimulation (manual), 97016- Vasopneumatic device, [id number]- Ultrasound, [id number]- Traction (mechanical), [id number]- Ionotophoresis 4mg /ml Dexamethasone , Patient/Family education, Balance training, Stair training, Taping, Dry Needling, Joint mobilization, Joint manipulation, Spinal manipulation, Spinal mobilization, Scar mobilization, Vestibular training, Visual/preceptual remediation/compensation, DME  instructions, Cryotherapy, and Moist heat.  All performed as medically necessary.  All included unless contraindicated  PLAN FOR NEXT SESSION: Progress lumbar mobility and hip strengthening.    Kabe Mckoy April Ma L Deardra Hinkley, PT, DPT 03/28/24  1:06 PM

## 2024-03-31 ENCOUNTER — Encounter: Admitting: Rehabilitative and Restorative Service Providers"

## 2024-04-04 ENCOUNTER — Encounter: Admitting: Physical Therapy

## 2024-04-04 DIAGNOSIS — H0100A Unspecified blepharitis right eye, upper and lower eyelids: Secondary | ICD-10-CM | POA: Diagnosis not present

## 2024-04-04 DIAGNOSIS — H0100B Unspecified blepharitis left eye, upper and lower eyelids: Secondary | ICD-10-CM | POA: Diagnosis not present

## 2024-04-07 ENCOUNTER — Encounter: Admitting: Rehabilitative and Restorative Service Providers"

## 2024-04-08 ENCOUNTER — Telehealth: Payer: Self-pay | Admitting: Physical Therapy

## 2024-04-08 ENCOUNTER — Encounter: Admitting: Physical Therapy

## 2024-04-08 NOTE — Telephone Encounter (Signed)
 I called pt after she missed her 8:00 am PT appointment. Pt stated her alarm didn't go off and she just woke up. Pt was offered another slot on Thursday at 1:00 pm.  Jerrel Mor, PT, MPT 04/08/24 8:32 AM

## 2024-04-10 ENCOUNTER — Ambulatory Visit: Admitting: Rehabilitative and Restorative Service Providers"

## 2024-04-10 ENCOUNTER — Encounter: Payer: Self-pay | Admitting: Rehabilitative and Restorative Service Providers"

## 2024-04-10 DIAGNOSIS — M5459 Other low back pain: Secondary | ICD-10-CM | POA: Diagnosis not present

## 2024-04-10 DIAGNOSIS — R262 Difficulty in walking, not elsewhere classified: Secondary | ICD-10-CM

## 2024-04-10 DIAGNOSIS — M6281 Muscle weakness (generalized): Secondary | ICD-10-CM

## 2024-04-10 NOTE — Therapy (Signed)
 OUTPATIENT PHYSICAL THERAPY TREATMENT NOTE   Patient Name: Kayla Marshall MRN: [id number] DOB:1951/02/09, 73 y.o., female Today's Date: 04/10/2024  END OF SESSION:  PT End of Session - 04/10/24 1308     Visit Number 4    Number of Visits 12    Date for PT Re-Evaluation 05/26/24    Authorization Type [employer] MCR    PT Start Time 1306    PT Stop Time 1347    PT Time Calculation (min) 41 min    Activity Tolerance Patient limited by pain;No increased pain;Patient tolerated treatment well    Behavior During Therapy [facility] for tasks assessed/performed             Past Medical History:  Diagnosis Date   Allergies    Anemia    Anxiety    Arthritis    back   B12 deficiency    Back pain    Chewing difficulty    Cold intolerance    Depression    Diabetic peripheral neuropathy (HCC) 01/06/2020   Dry mouth    Fatigue    Gallbladder disease    GERD (gastroesophageal reflux disease)    Glaucoma    Hay fever    Headache    Hoarseness    Hypertension    Hypothyroidism    Irritable bowel    Joint pain    Lactose intolerance    Leg cramps    Muscle stiffness    Nervousness    Obesity    Palpitations    PPH (postpartum hemorrhage)    Prediabetes    Sciatic pain    Sciatica    Shortness of breath dyspnea    Sleep apnea    Sleep apnea    Stress    Swelling of lower extremity    Tinnitus    Torn meniscus    left   Vitamin D  deficiency    Weakness    Past Surgical History:  Procedure Laterality Date   CHOLECYSTECTOMY  1990   DILATATION & CURETTAGE/HYSTEROSCOPY WITH MYOSURE N/A 11/12/2015   Procedure: DILATATION & CURETTAGE/HYSTEROSCOPY WITH  MYOSURE;  Surgeon: Johnn Najjar, MD;  Location: [facility];  Service: Gynecology;  Laterality: N/A;   Patient Active Problem List   Diagnosis Date Noted   Dyspnea on exertion 05/30/2023   Chronic pain of right knee 10/25/2021   Diabetic peripheral neuropathy (HCC) 01/06/2020   Pain of neck with recent traumatic injury 12/12/2019    Deviated septum 10/01/2019   Left hip pain 02/13/2019   Chronic pain of left knee 07/17/2018   Morbid obesity (HCC) 07/17/2018   Body mass index 40.0-44.9, adult (HCC) 07/17/2018   Lumbar spondylosis 04/24/2018   Allergic rhinitis 04/24/2018   Anxiety disorder 04/24/2018   Digital mucous cyst of finger of left hand 10/08/2017   Acute medial meniscus tear of left knee 07/30/2017   OSA on CPAP 08/26/2015   Essential (primary) hypertension 04/05/2015   Palpitation 08/21/2012   Hypothyroidism 08/21/2012    PCP: Victorio Grave, MD   REFERRING PROVIDER: Wes Hamman, MD   REFERRING DIAG: M54.50,G89.29 (ICD-10-CM) - Chronic bilateral low back pain without sciatica   Rationale for Evaluation and Treatment: Rehabilitation  THERAPY DIAG:  Other low back pain  Muscle weakness (generalized)  Difficulty in walking, not elsewhere classified  ONSET DATE: Chronic back pain, worse since January 2025  SUBJECTIVE:  SUBJECTIVE STATEMENT: Kayla Marshall notes she has 23 steps to get in and out of her car.  She has 2 meniscus tears.  She reported difficulty with her clams which we will address today.  She was able to ballroom dance Sunday.  PERTINENT HISTORY: 2 torn meniscus, neruopathy, disc bluges and stenosis  PAIN:  NPRS scale: 2-4/10 this week Pain location: Center of lower back, left hip Pain description: tight, burning, Aggravating factors: bending over, sitting too much, cleaning, lifting Relieving factors: lidocane, tyelonol, sitting just not too long  PRECAUTIONS: None  WEIGHT BEARING RESTRICTIONS: No  FALLS:  Has patient fallen in last 6 months? No  OCCUPATION: retired, does do Nurse, mental health work  PLOF: Independent  PATIENT GOALS: She wants to be able to dance without the pain  Next MD  Visit: nothing scheduled at eval   OBJECTIVE:   DIAGNOSTIC FINDINGS:  No recent images of spine, MRI ordered for neck, Previous MRI 2021 IMPRESSION: Multilevel degenerative changes of the lumbar spine, worst at L4-L5 where there is moderate spinal canal stenosis, narrowing of the bilateral subarticular zones, and mild bilateral neural foraminal narrowing, progressed since prior MRI. Disc bulges noted  PATIENT SURVEYS:  Patient-Specific Activity Scoring Scheme  "0" represents "unable to perform." "10" represents "able to perform at prior level. 0 1 2 3 4 5 6 7 8 9  10 (Date and Score)   Activity Eval  02/25/24    1. Bending/lifting 3     2. dancing 6     3. stairs 5   4.getting up from floor 1   5.walking up to 7,000 steps per day 1   Score 3.2    Total score = sum of the activity scores/number of activities Minimum detectable change (90%CI) for average score = 2 points Minimum detectable change (90%CI) for single activity score = 3 points  SCREENING FOR RED FLAGS: Bowel or bladder incontinence: No Cauda equina syndrome: No  COGNITION: Overall cognitive status: WFL normal      LUMBAR ROM:   Directional Preference Assessment: Centralization:none with repeated flexion or extension Peripheralization: some with repeated extension, even more with repeated flexion  AROM Eval 02/25/24  Flexion 80%  Extension 25%  Right lateral flexion 50%  Left lateral flexion 50%  Right rotation 50%  Left rotation 50%   (Blank rows = not tested)  LOWER EXTREMITY Strength MMT:       Right Eval 02/25/24 Left Eval 02/25/24  Hip flexion 4 4  Hip extension    Hip abduction 4 4  Hip adduction    Hip internal rotation    Hip external rotation    Knee flexion 5 5  Knee extension 4 4  Ankle dorsiflexion    Ankle plantarflexion    Ankle inversion    Ankle eversion     (Blank rows = not tested)   LUMBAR SPECIAL TESTS:  02/25/24 Straight leg raise test: Positive and Slump test:  Positive  TODAY'S TREATMENT:                                                                                                         DATE: 04/10/2024  Heel to toe raises with transversus abdominus contraction and foam block to avoid cheating 2 sets of 10 for 3 seconds Hip hike at counter top 2 sets of 10 for 3 seconds Yoga Bridge 10 x 5 seconds (as high as comfortable) Standing lumbar extension (hips forward) 10 x 3 seconds Side-lie clams (no resistance) 10 x each side  Functional Activities: Practical Golfer's and diagonal squat lifts for dishes, laundry, discussion of dragging supplies up stairs with more face forward posture to avoid flexion and rotation   TODAY'S TREATMENT:                                                                                                         DATE: 03/28/2024  Therex: Seated hamstring stretch x30" Supine figure 4 stretch 2x30" Supine hip flexor stretch 2x30" Supine LTR 3x15" Sidelying clamshell red TB 2x10 Supine PPT + ab set 2x10 (attempted with ball but pt had difficulty coordinating) Supine bridge x10 Nustep L5 UEs/LEs x 10 min   TODAY'S TREATMENT:                                                                                                         DATE:  03/18/2024  Therex: Nustep lvl 5 UE/LE 8 mins for mobility and aerobic exercise intervention.   Performed education on aerobic exercise intervention and benefits.  Standing lumbar extension AROM review with performance x 5  Standing hip abduction green band x 10 bilateral (provided red at home due to level of challenge with green) Standing hip extension green band x 10 bilateral (provided red at home due to level of challenge with green) Supine lumbar trunk rotation 15 sec x 3  bilateral Supine figure 4 pull towards 15 sec x 3 bilateral. Supine bridge x 10   Additional time spent in review of exercise techniques and positioning with plenty of Q and A time with patient.    HOME EXERCISE PROGRAM: Access Code: [email or web address] URL: [email or web address] Date: 04/10/2024 Prepared by: Terral Ferrari  Exercises - Standing Lumbar Extension with Counter  - 3-5 x daily - 7 x weekly - 1 sets - 5-10 reps - Supine Lower Trunk Rotation  - 2-3 x daily - 7 x weekly - 1 sets - 3-5 reps - 15 hold - Standing Hip Flexor Stretch  - 2 x daily - 6 x weekly - 1 sets - 3 reps - 15-20 sec hold - Standing Hip Extension with Resistance at Ankles and Unilateral Counter Support  - 2 x daily - 6 x weekly - 1-2 sets - 10 reps - Supine Figure 4  - 1 x daily - 7 x weekly - 2 sets - 30 sec hold - Supine Posterior Pelvic Tilt  - 1 x daily - 7 x weekly - 2 sets - 10 reps - Clamshell with Resistance  - 1 x daily - 7 x weekly - 2 sets - 10 reps - Standing Lumbar Extension at Wall - Forearms  - 5 x daily - 7 x weekly - 1 sets - 5 reps - 3 seconds hold - Yoga Bridge  - 1-2 x daily - 7 x weekly - 1 sets - 10 reps - 3 seconds hold - Heel Toe Raises with Counter Support  - 3 x daily - 7 x weekly - 1 sets - 10 reps - 3 seconds hold - Standing Hip Hiking  - 3 x daily - 7 x weekly - 1 sets - 10 reps - 3 seconds hold  ASSESSMENT:  CLINICAL IMPRESSION: Tyjai benefited from practical work on activities that are currently giving her trouble (dishes, laundry, getting her work bag up stairs).  We reviewed and made slight progressions to core strength.  Her prognosis to meet long-term goals is good with consistent HEP compliance and continued supervised PT.  OBJECTIVE IMPAIRMENTS: Abnormal gait, decreased balance, decreased coordination, decreased endurance, decreased mobility, difficulty walking, decreased ROM, decreased strength, impaired flexibility, obesity, and pain.   ACTIVITY LIMITATIONS:  carrying, lifting, bending, sitting, standing, squatting, sleeping, stairs, transfers, and bed mobility  PARTICIPATION LIMITATIONS: meal prep, cleaning, laundry, community activity, and occupation  PERSONAL FACTORS: Age, Fitness, Past/current experiences, and 3+ comorbidities:   are also affecting patient's functional outcome.   REHAB POTENTIAL: Good  CLINICAL DECISION MAKING: Evolving/moderate complexity  EVALUATION COMPLEXITY: Moderate   GOALS: Goals reviewed with patient? Yes  SHORT TERM GOALS: (target date for Short term goals are 3 weeks 04/07/2024)  1. Patient will demonstrate independent use of home exercise program to maintain progress from in clinic treatments.  Goal status: Met 04/10/2024  LONG TERM GOALS: (target dates for all long term goals are 10 weeks  05/26/2024)   1. Patient will demonstrate/report pain at worst less than or equal to 4/10 to facilitate minimal limitation in daily activity secondary to pain symptoms.  Goal status: On Going 04/10/2024   2. Patient will demonstrate independent use of home exercise program to facilitate ability to maintain/progress functional gains from skilled physical therapy services.  Goal status: New   3. Patient will demonstrate Patient specific functional scale avg > or = 6/10 to indicate reduced disability due to condition.   Goal status: New   4. Patient will demonstrate lumbar ROM at least  75% for [facility] ROM  to facilitate upright standing, walking posture at PLOF s limitation.  Goal status: New   5. Patient will report she is able to dance longer with less overall pain from when she started pain.  Goal status: Partially Met 04/10/2024  PLAN:  PT  FREQUENCY: 1-2x/week  PT DURATION: 10 weeks  PLANNED INTERVENTIONS: Can include 16109- PT Re-evaluation, 97110-Therapeutic exercises, 97530- Therapeutic activity, 97112- Neuromuscular re-education, 97535- Self Care, 97140- Manual therapy, [phone]- Gait training, [phone]- Orthotic  Fit/training, [phone]- Canalith repositioning, [id number]- Aquatic Therapy, [phone]- Electrical stimulation (unattended), 97750 Physical performance testing, [id number]- Electrical stimulation (manual), 97016- Vasopneumatic device, [id number]- Ultrasound, [id number]- Traction (mechanical), [id number]- Ionotophoresis 4mg /ml Dexamethasone , Patient/Family education, Balance training, Stair training, Taping, Dry Needling, Joint mobilization, Joint manipulation, Spinal manipulation, Spinal mobilization, Scar mobilization, Vestibular training, Visual/preceptual remediation/compensation, DME instructions, Cryotherapy, and Moist heat.  All performed as medically necessary.  All included unless contraindicated  PLAN FOR NEXT SESSION: Practical work, low back and hip strengthening, AROM and flexibility as needed.   Joli Neas, PT, MPT 04/10/24  5:41 PM

## 2024-04-11 ENCOUNTER — Encounter: Admitting: Physical Therapy

## 2024-04-18 ENCOUNTER — Encounter: Admitting: Physical Therapy

## 2024-04-21 ENCOUNTER — Encounter: Payer: Self-pay | Admitting: Rehabilitative and Restorative Service Providers"

## 2024-04-21 ENCOUNTER — Ambulatory Visit: Admitting: Rehabilitative and Restorative Service Providers"

## 2024-04-21 DIAGNOSIS — R262 Difficulty in walking, not elsewhere classified: Secondary | ICD-10-CM

## 2024-04-21 DIAGNOSIS — M5459 Other low back pain: Secondary | ICD-10-CM | POA: Diagnosis not present

## 2024-04-21 DIAGNOSIS — M6281 Muscle weakness (generalized): Secondary | ICD-10-CM | POA: Diagnosis not present

## 2024-04-21 NOTE — Therapy (Signed)
 OUTPATIENT PHYSICAL THERAPY TREATMENT NOTE   Patient Name: Kayla Marshall MRN: [id number] DOB:[birth year], 73 y.o., female Today's Date: 04/21/2024  END OF SESSION:  PT End of Session - 04/21/24 1430     Visit Number 5    Number of Visits 12    Date for PT Re-Evaluation 05/26/24    Authorization Type [employer] MCR    PT Start Time 1429    PT Stop Time 1508    PT Time Calculation (min) 39 min    Activity Tolerance Patient limited by pain    Behavior During Therapy [facility] for tasks assessed/performed              Past Medical History:  Diagnosis Date   Allergies    Anemia    Anxiety    Arthritis    back   B12 deficiency    Back pain    Chewing difficulty    Cold intolerance    Depression    Diabetic peripheral neuropathy (HCC) 01/06/2020   Dry mouth    Fatigue    Gallbladder disease    GERD (gastroesophageal reflux disease)    Glaucoma    Hay fever    Headache    Hoarseness    Hypertension    Hypothyroidism    Irritable bowel    Joint pain    Lactose intolerance    Leg cramps    Muscle stiffness    Nervousness    Obesity    Palpitations    PPH (postpartum hemorrhage)    Prediabetes    Sciatic pain    Sciatica    Shortness of breath dyspnea    Sleep apnea    Sleep apnea    Stress    Swelling of lower extremity    Tinnitus    Torn meniscus    left   Vitamin D  deficiency    Weakness    Past Surgical History:  Procedure Laterality Date   CHOLECYSTECTOMY  1990   DILATATION & CURETTAGE/HYSTEROSCOPY WITH MYOSURE N/A 11/12/2015   Procedure: DILATATION & CURETTAGE/HYSTEROSCOPY WITH  MYOSURE;  Surgeon: Johnn Najjar, MD;  Location: [facility];  Service: Gynecology;  Laterality: N/A;   Patient Active Problem List   Diagnosis Date Noted   Dyspnea on exertion 05/30/2023   Chronic pain of right knee 10/25/2021   Diabetic peripheral neuropathy (HCC) 01/06/2020   Pain of neck with recent traumatic injury 12/12/2019   Deviated septum 10/01/2019   Left hip pain  02/13/2019   Chronic pain of left knee 07/17/2018   Morbid obesity (HCC) 07/17/2018   Body mass index 40.0-44.9, adult (HCC) 07/17/2018   Lumbar spondylosis 04/24/2018   Allergic rhinitis 04/24/2018   Anxiety disorder 04/24/2018   Digital mucous cyst of finger of left hand 10/08/2017   Acute medial meniscus tear of left knee 07/30/2017   OSA on CPAP 08/26/2015   Essential (primary) hypertension 04/05/2015   Palpitation 08/21/2012   Hypothyroidism 08/21/2012    PCP: Victorio Grave, MD   REFERRING PROVIDER: Wes Hamman, MD   REFERRING DIAG: M54.50,G89.29 (ICD-10-CM) - Chronic bilateral low back pain without sciatica   Rationale for Evaluation and Treatment: Rehabilitation  THERAPY DIAG:  Other low back pain  Muscle weakness (generalized)  Difficulty in walking, not elsewhere classified  ONSET DATE: Chronic back pain, worse since January 2025  SUBJECTIVE:  SUBJECTIVE STATEMENT: Pt indicated feeling generally tired.  Pt indicated having some complaints of Lt hip with some cramps.    PERTINENT HISTORY: 2 torn meniscus, neruopathy, disc bluges and stenosis  PAIN:  NPRS scale: 8/10 Pain location: Left hip Pain description: tight, burning, Aggravating factors: bending over, sitting too much, cleaning, lifting Relieving factors: lidocane, tyelonol, sitting just not too long  PRECAUTIONS: None  WEIGHT BEARING RESTRICTIONS: No  FALLS:  Has patient fallen in last 6 months? No  OCCUPATION: retired, does do Nurse, mental health work  PLOF: Independent  PATIENT GOALS: She wants to be able to dance without the pain  Next MD Visit: nothing scheduled at eval   OBJECTIVE:   DIAGNOSTIC FINDINGS:  No recent images of spine, MRI ordered for neck, Previous MRI 2021 IMPRESSION: Multilevel  degenerative changes of the lumbar spine, worst at L4-L5 where there is moderate spinal canal stenosis, narrowing of the bilateral subarticular zones, and mild bilateral neural foraminal narrowing, progressed since prior MRI. Disc bulges noted  PATIENT SURVEYS:  Patient-Specific Activity Scoring Scheme  "0" represents "unable to perform." "10" represents "able to perform at prior level. 0 1 2 3 4 5 6 7 8 9  10 (Date and Score)   Activity Eval  02/25/24 04/21/2024   1. Bending/lifting 3  7   2. dancing 6   8  3. stairs 5 8  4.getting up from floor 1 2  5.walking up to 7,000 steps per day 1 4  Score 3.2 5.8   Total score = sum of the activity scores/number of activities Minimum detectable change (90%CI) for average score = 2 points Minimum detectable change (90%CI) for single activity score = 3 points  SCREENING FOR RED FLAGS: Bowel or bladder incontinence: No Cauda equina syndrome: No  COGNITION: Overall cognitive status: WFL normal      LUMBAR ROM:   Directional Preference Assessment: Centralization:none with repeated flexion or extension Peripheralization: some with repeated extension, even more with repeated flexion  AROM Eval 02/25/24  Flexion 80%  Extension 25%  Right lateral flexion 50%  Left lateral flexion 50%  Right rotation 50%  Left rotation 50%   (Blank rows = not tested)  LOWER EXTREMITY Strength MMT:       Right Eval 02/25/24 Left Eval 02/25/24  Hip flexion 4 4  Hip extension    Hip abduction 4 4  Hip adduction    Hip internal rotation    Hip external rotation    Knee flexion 5 5  Knee extension 4 4  Ankle dorsiflexion    Ankle plantarflexion    Ankle inversion    Ankle eversion     (Blank rows = not tested)   LUMBAR SPECIAL TESTS:  02/25/24 Straight leg raise test: Positive and Slump test: Positive  PALPATION 04/21/2024: Trigger points with concordant symptoms noted Lt glute max/min/med.  TODAY'S TREATMENT:                                                                                                         DATE: 04/21/2024 Therex: Sidelying clamshell 2 x 15 , performed bilaterally  SKC 15 sec x 3 bilateral Supine figure 4 pull towards 15 sec x 3 bilateral  Supine bridge 2-3 sec hold 2 x 10  Hip hike 3 sec x 10 bilateral (cues for 1x/day due to difficulty)  Manual Percussive device to Lt glute med/min/max for trigger point release.   TherActivity Review and trial of sit to supine to sit tranfers with log rolling focus.  Verbal cues.    TODAY'S TREATMENT:                                                                                                         DATE: 04/10/2024  Heel to toe raises with transversus abdominus contraction and foam block to avoid cheating 2 sets of 10 for 3 seconds Hip hike at counter top 2 sets of 10 for 3 seconds Yoga Bridge 10 x 5 seconds (as high as comfortable) Standing lumbar extension (hips forward) 10 x 3 seconds Side-lie clams (no resistance) 10 x each side  Functional Activities: Practical Golfer's and diagonal squat lifts for dishes, laundry, discussion of dragging supplies up stairs with more face forward posture to avoid flexion and rotation   TODAY'S TREATMENT:                                                                                                         DATE: 03/28/2024  Therex: Seated hamstring stretch x30" Supine figure 4 stretch 2x30" Supine hip flexor stretch 2x30" Supine LTR 3x15" Sidelying clamshell red TB 2x10 Supine PPT + ab set 2x10 (attempted with ball but pt had difficulty coordinating) Supine bridge x10 Nustep L5 UEs/LEs x 10 min   TODAY'S TREATMENT:  DATE:  03/18/2024  Therex: Nustep lvl 5 UE/LE 8 mins for mobility and aerobic exercise intervention.   Performed education on aerobic exercise intervention and benefits.  Standing lumbar extension AROM review with performance x 5  Standing hip abduction green band x 10 bilateral (provided red at home due to level of challenge with green) Standing hip extension green band x 10 bilateral (provided red at home due to level of challenge with green) Supine lumbar trunk rotation 15 sec x 3 bilateral Supine figure 4 pull towards 15 sec x 3 bilateral. Supine bridge x 10   Additional time spent in review of exercise techniques and positioning with plenty of Q and A time with patient.    HOME EXERCISE PROGRAM: Access Code: [email or web address] URL: [email or web address] Date: 04/10/2024 Prepared by: Terral Ferrari  Exercises - Standing Lumbar Extension with Counter  - 3-5 x daily - 7 x weekly - 1 sets - 5-10 reps - Supine Lower Trunk Rotation  - 2-3 x daily - 7 x weekly - 1 sets - 3-5 reps - 15 hold - Standing Hip Flexor Stretch  - 2 x daily - 6 x weekly - 1 sets - 3 reps - 15-20 sec hold - Standing Hip Extension with Resistance at Ankles and Unilateral Counter Support  - 2 x daily - 6 x weekly - 1-2 sets - 10 reps - Supine Figure 4  - 1 x daily - 7 x weekly - 2 sets - 30 sec hold - Supine Posterior Pelvic Tilt  - 1 x daily - 7 x weekly - 2 sets - 10 reps - Clamshell with Resistance  - 1 x daily - 7 x weekly - 2 sets - 10 reps - Standing Lumbar Extension at Wall - Forearms  - 5 x daily - 7 x weekly - 1 sets - 5 reps - 3 seconds hold - Yoga Bridge  - 1-2 x daily - 7 x weekly - 1 sets - 10 reps - 3 seconds hold - Heel Toe Raises with Counter Support  - 3 x daily - 7 x weekly - 1 sets - 10 reps - 3 seconds hold - Standing Hip Hiking  - 3 x daily - 7 x weekly - 1 sets - 10 reps - 3 seconds hold  ASSESSMENT:  CLINICAL IMPRESSION: Spent time today with manual intervention for trigger points,  myofascial pain complaints in Lt lateral hip. Pt may continue to benefit from skilled PT services to address hip strength/mobility deficits to continue to improve tolerance to activity.   OBJECTIVE IMPAIRMENTS: Abnormal gait, decreased balance, decreased coordination, decreased endurance, decreased mobility, difficulty walking, decreased ROM, decreased strength, impaired flexibility, obesity, and pain.   ACTIVITY LIMITATIONS: carrying, lifting, bending, sitting, standing, squatting, sleeping, stairs, transfers, and bed mobility  PARTICIPATION LIMITATIONS: meal prep, cleaning, laundry, community activity, and occupation  PERSONAL FACTORS: Age, Fitness, Past/current experiences, and 3+ comorbidities:   are also affecting patient's functional outcome.   REHAB POTENTIAL: Good  CLINICAL DECISION MAKING: Evolving/moderate complexity  EVALUATION COMPLEXITY: Moderate   GOALS: Goals reviewed with patient? Yes  SHORT TERM GOALS: (target date for Short term goals are 3 weeks 04/07/2024)  1. Patient will demonstrate independent use of home exercise program to maintain progress from in clinic treatments.  Goal status: Met 04/10/2024  LONG TERM GOALS: (target dates for all long term goals are 10 weeks  05/26/2024)   1. Patient will demonstrate/report pain at worst less than or equal to 4/10 to facilitate minimal limitation in  daily activity secondary to pain symptoms.  Goal status: on going 04/21/2024   2. Patient will demonstrate independent use of home exercise program to facilitate ability to maintain/progress functional gains from skilled physical therapy services.  Goal status: on going 04/21/2024   3. Patient will demonstrate Patient specific functional scale avg > or = 6/10 to indicate reduced disability due to condition.   Goal status: on going 04/21/2024   4. Patient will demonstrate lumbar ROM at least  75% for [facility] ROM  to facilitate upright standing, walking posture at PLOF s  limitation.  Goal status: on going 6/2/2025on going 04/21/2024   5. Patient will report she is able to dance longer with less overall pain from when she started pain.  Goal status: Partially Met 04/10/2024  PLAN:  PT FREQUENCY: 1-2x/week  PT DURATION: 10 weeks  PLANNED INTERVENTIONS: Can include 82956- PT Re-evaluation, 97110-Therapeutic exercises, 97530- Therapeutic activity, 97112- Neuromuscular re-education, 97535- Self Care, 97140- Manual therapy, [phone]- Gait training, [phone]- Orthotic Fit/training, [phone]- Canalith repositioning, [id number]- Aquatic Therapy, [phone]- Electrical stimulation (unattended), 97750 Physical performance testing, [phone]- Electrical stimulation (manual), 97016- Vasopneumatic device, [id number]- Ultrasound, [id number]- Traction (mechanical), [id number]- Ionotophoresis 4mg /ml Dexamethasone , Patient/Family education, Balance training, Stair training, Taping, Dry Needling, Joint mobilization, Joint manipulation, Spinal manipulation, Spinal mobilization, Scar mobilization, Vestibular training, Visual/preceptual remediation/compensation, DME instructions, Cryotherapy, and Moist heat.  All performed as medically necessary.  All included unless contraindicated  PLAN FOR NEXT SESSION: Recheck mobility, strength measurements.    Bonna Bustard, PT, DPT, OCS, ATC 04/21/24  3:11 PM

## 2024-04-22 DIAGNOSIS — H0100A Unspecified blepharitis right eye, upper and lower eyelids: Secondary | ICD-10-CM | POA: Diagnosis not present

## 2024-04-22 DIAGNOSIS — H0100B Unspecified blepharitis left eye, upper and lower eyelids: Secondary | ICD-10-CM | POA: Diagnosis not present

## 2024-04-22 DIAGNOSIS — H401131 Primary open-angle glaucoma, bilateral, mild stage: Secondary | ICD-10-CM | POA: Diagnosis not present

## 2024-04-23 DIAGNOSIS — R399 Unspecified symptoms and signs involving the genitourinary system: Secondary | ICD-10-CM | POA: Diagnosis not present

## 2024-04-23 DIAGNOSIS — R350 Frequency of micturition: Secondary | ICD-10-CM | POA: Diagnosis not present

## 2024-04-25 ENCOUNTER — Encounter: Admitting: Rehabilitative and Restorative Service Providers"

## 2024-04-28 ENCOUNTER — Ambulatory Visit: Admitting: Rehabilitative and Restorative Service Providers"

## 2024-04-28 ENCOUNTER — Encounter: Payer: Self-pay | Admitting: Rehabilitative and Restorative Service Providers"

## 2024-04-28 DIAGNOSIS — M6281 Muscle weakness (generalized): Secondary | ICD-10-CM | POA: Diagnosis not present

## 2024-04-28 DIAGNOSIS — R262 Difficulty in walking, not elsewhere classified: Secondary | ICD-10-CM

## 2024-04-28 DIAGNOSIS — M5459 Other low back pain: Secondary | ICD-10-CM | POA: Diagnosis not present

## 2024-04-28 NOTE — Therapy (Addendum)
 OUTPATIENT PHYSICAL THERAPY TREATMENT NOTE / DISCHARGE   Patient Name: Kayla Marshall MRN: [id number] DOB:[birth year], 73 y.o., female Today's Date: 04/28/2024  END OF SESSION:  PT End of Session - 04/28/24 1530     Visit Number 6    Number of Visits 12    Date for PT Re-Evaluation 05/26/24    Authorization Type [employer] MCR    PT Start Time 1522    PT Stop Time 1554    PT Time Calculation (min) 32 min    Activity Tolerance Patient limited by pain    Behavior During Therapy [facility] for tasks assessed/performed               Past Medical History:  Diagnosis Date   Allergies    Anemia    Anxiety    Arthritis    back   B12 deficiency    Back pain    Chewing difficulty    Cold intolerance    Depression    Diabetic peripheral neuropathy (HCC) 01/06/2020   Dry mouth    Fatigue    Gallbladder disease    GERD (gastroesophageal reflux disease)    Glaucoma    Hay fever    Headache    Hoarseness    Hypertension    Hypothyroidism    Irritable bowel    Joint pain    Lactose intolerance    Leg cramps    Muscle stiffness    Nervousness    Obesity    Palpitations    PPH (postpartum hemorrhage)    Prediabetes    Sciatic pain    Sciatica    Shortness of breath dyspnea    Sleep apnea    Sleep apnea    Stress    Swelling of lower extremity    Tinnitus    Torn meniscus    left   Vitamin D  deficiency    Weakness    Past Surgical History:  Procedure Laterality Date   CHOLECYSTECTOMY  1990   DILATATION & CURETTAGE/HYSTEROSCOPY WITH MYOSURE N/A 11/12/2015   Procedure: DILATATION & CURETTAGE/HYSTEROSCOPY WITH  MYOSURE;  Surgeon: Hargis Paradise, MD;  Location: [facility];  Service: Gynecology;  Laterality: N/A;   Patient Active Problem List   Diagnosis Date Noted   Dyspnea on exertion 05/30/2023   Chronic pain of right knee 10/25/2021   Diabetic peripheral neuropathy (HCC) 01/06/2020   Pain of neck with recent traumatic injury 12/12/2019   Deviated septum 10/01/2019    Left hip pain 02/13/2019   Chronic pain of left knee 07/17/2018   Morbid obesity (HCC) 07/17/2018   Body mass index 40.0-44.9, adult (HCC) 07/17/2018   Lumbar spondylosis 04/24/2018   Allergic rhinitis 04/24/2018   Anxiety disorder 04/24/2018   Digital mucous cyst of finger of left hand 10/08/2017   Acute medial meniscus tear of left knee 07/30/2017   OSA on CPAP 08/26/2015   Essential (primary) hypertension 04/05/2015   Palpitation 08/21/2012   Hypothyroidism 08/21/2012    PCP: Teresa Channel, MD   REFERRING PROVIDER: Jerri Kay HERO, MD   REFERRING DIAG: M54.50,G89.29 (ICD-10-CM) - Chronic bilateral low back pain without sciatica   Rationale for Evaluation and Treatment: Rehabilitation  THERAPY DIAG:  Other low back pain  Muscle weakness (generalized)  Difficulty in walking, not elsewhere classified  ONSET DATE: Chronic back pain, worse since January 2025  SUBJECTIVE:  SUBJECTIVE STATEMENT: Pt indicated going to Urgent Care on Wednesday with urinalysis and blood sugar check and reported those testings were normal.  Reported culture was normal.    Pt indicated doing deep cleaning yesterday with lots of bending.  Pt indicated wanting to just review exercise and education vs doing them today.     PERTINENT HISTORY: 2 torn meniscus, neruopathy, disc bluges and stenosis  PAIN:  NPRS scale: 9/10 Pain location: Back Pain description: tooth ache Aggravating factors: bending over, sitting too much, cleaning, lifting Relieving factors: lidocane, tyelonol, sitting just not too long  PRECAUTIONS: None  WEIGHT BEARING RESTRICTIONS: No  FALLS:  Has patient fallen in last 6 months? No  OCCUPATION: retired, does do Nurse, mental health work  PLOF: Independent  PATIENT GOALS: She wants to be  able to dance without the pain  Next MD Visit: nothing scheduled at eval   OBJECTIVE:   DIAGNOSTIC FINDINGS:  No recent images of spine, MRI ordered for neck, Previous MRI 2021 IMPRESSION: Multilevel degenerative changes of the lumbar spine, worst at L4-L5 where there is moderate spinal canal stenosis, narrowing of the bilateral subarticular zones, and mild bilateral neural foraminal narrowing, progressed since prior MRI. Disc bulges noted  PATIENT SURVEYS:  Patient-Specific Activity Scoring Scheme  0 represents "unable to perform." 10 represents "able to perform at prior level. 0 1 2 3 4 5 6 7 8 9  10 (Date and Score)   Activity Eval  02/25/24 04/21/2024   1. Bending/lifting 3  7   2. dancing 6   8  3. stairs 5 8  4.getting up from floor 1 2  5.walking up to 7,000 steps per day 1 4  Score 3.2 5.8   Total score = sum of the activity scores/number of activities Minimum detectable change (90%CI) for average score = 2 points Minimum detectable change (90%CI) for single activity score = 3 points  SCREENING FOR RED FLAGS: Bowel or bladder incontinence: No Cauda equina syndrome: No  COGNITION: Overall cognitive status: WFL normal      LUMBAR ROM:   Directional Preference Assessment: Centralization:none with repeated flexion or extension Peripheralization: some with repeated extension, even more with repeated flexion  AROM Eval 02/25/24 04/28/2024  Flexion 80%   Extension 25% 50%  Right lateral flexion 50%   Left lateral flexion 50%   Right rotation 50%   Left rotation 50%    (Blank rows = not tested)  LOWER EXTREMITY Strength MMT:       Right Eval 02/25/24 Left Eval 02/25/24  Hip flexion 4 4  Hip extension    Hip abduction 4 4  Hip adduction    Hip internal rotation    Hip external rotation    Knee flexion 5 5  Knee extension 4 4  Ankle dorsiflexion    Ankle plantarflexion    Ankle inversion    Ankle eversion     (Blank rows = not tested)   LUMBAR  SPECIAL TESTS:  02/25/24 Straight leg raise test: Positive and Slump test: Positive  PALPATION 04/21/2024: Trigger points with concordant symptoms noted Lt glute max/min/med.  TODAY'S TREATMENT:                                                                                                         DATE: 04/28/2024 Therex: Time spent in education with persistent question and answer review for exercise routine in HEP.  Detailed education on muscle activation and benefits of use of intervention.  Handout provided again with review.   Estim: IFC lumbar to tolerance 15 mins during therex education. Performed in sitting.    TODAY'S TREATMENT:                                                                                                         DATE: 04/21/2024 Therex: Sidelying clamshell 2 x 15 , performed bilaterally  SKC 15 sec x 3 bilateral Supine figure 4 pull towards 15 sec x 3 bilateral  Supine bridge 2-3 sec hold 2 x 10  Hip hike 3 sec x 10 bilateral (cues for 1x/day due to difficulty)  Manual Percussive device to Lt glute med/min/max for trigger point release.   TherActivity Review and trial of sit to supine to sit tranfers with log rolling focus.  Verbal cues.    TODAY'S TREATMENT:                                                                                                         DATE: 04/10/2024  Heel to toe raises with transversus abdominus contraction and foam block to avoid cheating 2 sets of 10 for 3 seconds Hip hike at counter top 2 sets of 10 for 3 seconds Yoga Bridge 10 x 5 seconds (as high as comfortable) Standing lumbar extension (hips forward) 10 x 3 seconds Side-lie clams (no resistance) 10 x each side  Functional Activities: Practical Golfer's and diagonal  squat lifts for dishes, laundry, discussion of dragging supplies up stairs with more face forward posture to avoid flexion and rotation   TODAY'S TREATMENT:  DATE: 03/28/2024  Therex: Seated hamstring stretch x30 Supine figure 4 stretch 2x30 Supine hip flexor stretch 2x30 Supine LTR 3x15 Sidelying clamshell red TB 2x10 Supine PPT + ab set 2x10 (attempted with ball but pt had difficulty coordinating) Supine bridge x10 Nustep L5 UEs/LEs x 10 min   HOME EXERCISE PROGRAM: Access Code: [email or web address] URL: [email or web address] Date: 04/28/2024 Prepared by: Ozell Silvan  Exercises - Standing Lumbar Extension with Counter  - 3-5 x daily - 7 x weekly - 1 sets - 5-10 reps - Supine Lower Trunk Rotation  - 2-3 x daily - 7 x weekly - 1 sets - 3-5 reps - 15 hold - Standing Hip Flexor Stretch  - 2 x daily - 6 x weekly - 1 sets - 3 reps - 15-20 sec hold - Standing Hip Extension with Resistance at Ankles and Unilateral Counter Support  - 2 x daily - 6 x weekly - 1-2 sets - 10 reps - Supine Figure 4  - 1 x daily - 7 x weekly - 2 sets - 30 sec hold - Supine Posterior Pelvic Tilt  - 1 x daily - 7 x weekly - 2 sets - 10 reps - Clamshell with Resistance  - 1 x daily - 7 x weekly - 2 sets - 10 reps - Standing Lumbar Extension at Wall - Forearms  - 5 x daily - 7 x weekly - 1 sets - 5 reps - 3 seconds hold - Yoga Bridge  - 1-2 x daily - 7 x weekly - 1 sets - 10 reps - 3 seconds hold - Heel Toe Raises with Counter Support  - 3 x daily - 7 x weekly - 1 sets - 10 reps - 3 seconds hold - Standing Hip Hiking  - 3 x daily - 7 x weekly - 1 sets - 10 reps - 3 seconds hold  ASSESSMENT:  CLINICAL IMPRESSION: Time spent in education review of exercise per Pt request due to overall presentation and desire not to perform intervention.  Estim was helpful in symptom relief while in visit.  Continued  cueing for intervention may be necessary.   OBJECTIVE IMPAIRMENTS: Abnormal gait, decreased balance, decreased coordination, decreased endurance, decreased mobility, difficulty walking, decreased ROM, decreased strength, impaired flexibility, obesity, and pain.   ACTIVITY LIMITATIONS: carrying, lifting, bending, sitting, standing, squatting, sleeping, stairs, transfers, and bed mobility  PARTICIPATION LIMITATIONS: meal prep, cleaning, laundry, community activity, and occupation  PERSONAL FACTORS: Age, Fitness, Past/current experiences, and 3+ comorbidities:   are also affecting patient's functional outcome.   REHAB POTENTIAL: Good  CLINICAL DECISION MAKING: Evolving/moderate complexity  EVALUATION COMPLEXITY: Moderate   GOALS: Goals reviewed with patient? Yes  SHORT TERM GOALS: (target date for Short term goals are 3 weeks 04/07/2024)  1. Patient will demonstrate independent use of home exercise program to maintain progress from in clinic treatments.  Goal status: Met 04/10/2024  LONG TERM GOALS: (target dates for all long term goals are 10 weeks  05/26/2024)   1. Patient will demonstrate/report pain at worst less than or equal to 4/10 to facilitate minimal limitation in daily activity secondary to pain symptoms.  Goal status: on going 04/21/2024   2. Patient will demonstrate independent use of home exercise program to facilitate ability to maintain/progress functional gains from skilled physical therapy services.  Goal status: on going 04/21/2024   3. Patient will demonstrate Patient specific functional scale avg > or = 6/10 to indicate reduced disability due to condition.   Goal status: on going 04/21/2024  4. Patient will demonstrate lumbar ROM at least  75% for [facility] ROM  to facilitate upright standing, walking posture at PLOF s limitation.  Goal status: on going 6/2/2025on going 04/21/2024   5. Patient will report she is able to dance longer with less overall pain from when she  started pain.  Goal status: Partially Met 04/10/2024  PLAN:  PT FREQUENCY: 1-2x/week  PT DURATION: 10 weeks  PLANNED INTERVENTIONS: Can include 02853- PT Re-evaluation, 97110-Therapeutic exercises, 97530- Therapeutic activity, 97112- Neuromuscular re-education, 97535- Self Care, 97140- Manual therapy, [phone]- Gait training, [phone]- Orthotic Fit/training, [phone]- Canalith repositioning, [id number]- Aquatic Therapy, [phone]- Electrical stimulation (unattended), 97750 Physical performance testing, [phone]- Electrical stimulation (manual), 97016- Vasopneumatic device, [id number]- Ultrasound, [id number]- Traction (mechanical), [id number]- Ionotophoresis 4mg /ml Dexamethasone , Patient/Family education, Balance training, Stair training, Taping, Dry Needling, Joint mobilization, Joint manipulation, Spinal manipulation, Spinal mobilization, Scar mobilization, Vestibular training, Visual/preceptual remediation/compensation, DME instructions, Cryotherapy, and Moist heat.  All performed as medically necessary.  All included unless contraindicated  PLAN FOR NEXT SESSION: Follow up on medical checks about complaints.    Ozell Silvan, PT, DPT, OCS, ATC 04/28/24  4:07 PM   PHYSICAL THERAPY DISCHARGE SUMMARY  Visits from Start of Care: 6  Current functional level related to goals / functional outcomes: See note   Remaining deficits: See note   Education / Equipment: HEP  Patient goals were partially met. Patient is being discharged due to not returning since the last visit.  Ozell Silvan, PT, DPT, OCS, ATC 07/07/24  2:53 PM

## 2024-06-04 DIAGNOSIS — L243 Irritant contact dermatitis due to cosmetics: Secondary | ICD-10-CM | POA: Diagnosis not present

## 2024-06-05 ENCOUNTER — Other Ambulatory Visit: Payer: Medicare HMO

## 2024-06-12 ENCOUNTER — Ambulatory Visit (HOSPITAL_BASED_OUTPATIENT_CLINIC_OR_DEPARTMENT_OTHER): Admitting: Pulmonary Disease

## 2024-06-12 ENCOUNTER — Encounter (HOSPITAL_BASED_OUTPATIENT_CLINIC_OR_DEPARTMENT_OTHER): Payer: Self-pay | Admitting: Pulmonary Disease

## 2024-06-12 VITALS — BP 130/62 | Ht 62.0 in | Wt 243.0 lb

## 2024-06-12 DIAGNOSIS — G4733 Obstructive sleep apnea (adult) (pediatric): Secondary | ICD-10-CM

## 2024-06-12 DIAGNOSIS — R0609 Other forms of dyspnea: Secondary | ICD-10-CM

## 2024-06-12 NOTE — Progress Notes (Signed)
 New Patient Pulmonology Office Visit   Subjective:  Patient ID: Kayla Marshall, female    DOB: 09-Oct-1951  MRN: [id number]  Referred by: Teresa Channel, MD  CC: No chief complaint on file.   Discussed the use of AI scribe software for clinical note transcription with the patient, who gave verbal consent to proceed.  History of Present Illness Kayla Marshall is a 73 year old female with sleep apnea who presents with shortness of breath.  She experiences shortness of breath primarily during physical activities such as walking and dancing. She needs to stop multiple times to catch her breath when walking uphill. Her activity level has decreased from 6,000 to 4,000 steps a day, requiring flat surfaces due to balance issues from neuropathy. Her shortness of breath has worsened with weight gain during the COVID-19 pandemic. There is no history of asthma or current use of inhalers, except for a brief period in January when she used albuterol and prednisone  for a respiratory issue. She has no history of smoking.  She has sleep apnea and uses a CPAP machine with a full face mask, which is uncomfortable and often loses its seal during the night, affecting sleep quality. A nasal mask was unsuitable due to mouth breathing. She cannot use nasal sprays like Flonase due to dizziness. She has been using the CPAP machine for several years, with some parts replaced over time.  Neuropathy and back issues limit her physical activity. Neuropathy causes burning in her feet, especially when walking, and affects her balance, necessitating walking on flat surfaces. She experiences swelling in her feet, particularly at the end of the day.     Significant tests/ events reviewed   06/2023 CT cors >> clear lungs 05/2015 HST AHI 22/hour with lowest desaturation of 81%   ROS  Constitutional: negative for anorexia, fevers and sweats  Eyes: negative for irritation, redness and visual disturbance  Ears, nose, mouth,  throat, and face: negative for earaches, epistaxis, nasal congestion and sore throat  Respiratory: negative for cough, dyspnea on exertion, sputum and wheezing  Cardiovascular: negative for chest pain, dyspnea, lower extremity edema, orthopnea, palpitations and syncope  Gastrointestinal: negative for abdominal pain, constipation, diarrhea, melena, nausea and vomiting  Genitourinary:negative for dysuria, frequency and hematuria  Hematologic/lymphatic: negative for bleeding, easy bruising and lymphadenopathy  Musculoskeletal:negative for arthralgias, muscle weakness and stiff joints  Neurological: negative for coordination problems, gait problems, headaches and weakness  Endocrine: negative for diabetic symptoms including polydipsia, polyuria and weight loss   Allergies: Erythromycin, Avelox [moxifloxacin hcl in nacl], Hydrocodone -acetaminophen , Loratadine, Melatonin, Meloxicam , Moxifloxacin, and Quinolones  Current Outpatient Medications:    ALPRAZolam (XANAX) 0.25 MG tablet, Take 0.25 mg by mouth 2 (two) times daily as needed for anxiety or sleep. , Disp: , Rfl: 0   Ascorbic Acid (VITAMIN C) 1000 MG tablet, Take 2,000 mg by mouth daily., Disp: , Rfl:    cholecalciferol (VITAMIN D3) 25 MCG (1000 UNIT) tablet, Take 5,000 Units by mouth daily., Disp: , Rfl:    latanoprost (XALATAN) 0.005 % ophthalmic solution, Place 0.005 drops into both eyes at bedtime., Disp: , Rfl: 4   lisinopril (ZESTRIL) 10 MG tablet, Take 10 mg by mouth daily., Disp: , Rfl:    magnesium 30 MG tablet, Take 30 mg by mouth 2 (two) times daily. (Patient not taking: Reported on 08/09/2023), Disp: , Rfl:    methocarbamol  (ROBAXIN ) 500 MG tablet, Take 1 tablet (500 mg total) by mouth every 6 (six) hours as needed for  muscle spasms., Disp: 30 tablet, Rfl: 2   predniSONE  (STERAPRED UNI-PAK 21 TAB) 10 MG (21) TBPK tablet, Take as directed, Disp: 21 tablet, Rfl: 3   traMADol  (ULTRAM ) 50 MG tablet, Take 1-2 tablets (50-100 mg total) by  mouth daily as needed., Disp: 20 tablet, Rfl: 0   Zinc 25 MG TABS, Take by mouth. (Patient not taking: Reported on 08/09/2023), Disp: , Rfl:  Past Medical History:  Diagnosis Date   Allergies    Anemia    Anxiety    Arthritis    back   B12 deficiency    Back pain    Chewing difficulty    Cold intolerance    Depression    Diabetic peripheral neuropathy (HCC) 01/06/2020   Dry mouth    Fatigue    Gallbladder disease    GERD (gastroesophageal reflux disease)    Glaucoma    Hay fever    Headache    Hoarseness    Hypertension    Hypothyroidism    Irritable bowel    Joint pain    Lactose intolerance    Leg cramps    Muscle stiffness    Nervousness    Obesity    Palpitations    PPH (postpartum hemorrhage)    Prediabetes    Sciatic pain    Sciatica    Shortness of breath dyspnea    Sleep apnea    Sleep apnea    Stress    Swelling of lower extremity    Tinnitus    Torn meniscus    left   Vitamin D  deficiency    Weakness    Past Surgical History:  Procedure Laterality Date   CHOLECYSTECTOMY  1990   DILATATION & CURETTAGE/HYSTEROSCOPY WITH MYOSURE N/A 11/12/2015   Procedure: DILATATION & CURETTAGE/HYSTEROSCOPY WITH  MYOSURE;  Surgeon: Hargis Paradise, MD;  Location: [facility];  Service: Gynecology;  Laterality: N/A;   Family History  Problem Relation Age of Onset   COPD Mother    Stroke Mother    Hypertension Mother    Anxiety disorder Mother    Heart attack Father    COPD Father    Heart disease Father    Depression Father    Heart disease Sister    Cancer Sister    Breast cancer Sister 54   Hypertension Brother    Social History   Socioeconomic History   Marital status: Divorced    Spouse name: Not on file   Number of children: Not on file   Years of education: Not on file   Highest education level: Not on file  Occupational History   Occupation: Research scientist (life sciences)  Tobacco Use   Smoking status: Never   Smokeless tobacco: Never  Vaping Use    Vaping status: Never Used  Substance and Sexual Activity   Alcohol use: Not Currently   Drug use: No   Sexual activity: Yes    Birth control/protection: Post-menopausal  Other Topics Concern   Not on file  Social History Narrative   Not on file   Social Drivers of Health   Financial Resource Strain: Not on file  Food Insecurity: Not on file  Transportation Needs: Not on file  Physical Activity: Not on file  Stress: Not on file  Social Connections: Not on file  Intimate Partner Violence: Not on file       Objective:  BP (!) 149/77 (BP Location: Left Arm, Patient Position: Sitting)   Ht 5' 2 (1.575 m)   Wt  243 lb (110.2 kg)   BMI 44.45 kg/m    Physical Exam  Gen. Pleasant, obese, in no distress ENT - no lesions, no post nasal drip Neck: No JVD, no thyromegaly, no carotid bruits Lungs: no use of accessory muscles, no dullness to percussion, decreased without rales or rhonchi  Cardiovascular: Rhythm regular, heart sounds  normal, no murmurs or gallops, 1+ peripheral edema Musculoskeletal: No deformities, no cyanosis or clubbing , no tremors       Assessment & Plan:  Assessment and Plan Assessment & Plan OSA  Sleep apnea with issues related to CPAP mask fit, leading to poor sleep quality. Current full face mask loses seal during the night, causing discomfort and inadequate sleep. - Prescribe a different CPAP mask that fits snugly against the nose to improve seal and comfort. - Send prescription to [facility] for new CPAP mask- small airfit F30 - Consider obtaining a report on current CPAP machine to assess pressure settings.   DOE Obesity Obesity contributing to shortness of breath, exacerbated by weight gain during COVID-19 pandemic. Shortness of breath worsens with activity and heat. No evidence of lung pathology on recent imaging. Weight loss and conditioning recommended to improve symptoms. Discussed that weight loss injections like Mounjaro or Ozempic have side  effects but can be effective for those who tolerate them. Emphasized that losing 20-30 pounds could significantly improve breathing and overall health. - Discuss weight loss strategies with primary care provider, including potential use of weight loss injections such as Mounjaro or Ozempic. - Encourage structured exercise program to improve conditioning and aid weight loss.    Hypertension Hypertension managed with lisinopril. Blood pressure monitoring discussed.         No follow-ups on file.   Harden ROCKFORD Jude, MD

## 2024-06-12 NOTE — Patient Instructions (Addendum)
 Trial of airfit F 30 full face mask to [facility]     VISIT SUMMARY: During your visit, we discussed your shortness of breath, sleep apnea, obesity, peripheral neuropathy, hypertension, and diabetes. We reviewed your symptoms and current treatments, and we made some adjustments to help improve your overall health and well-being.  YOUR PLAN: -SLEEP APNEA: Sleep apnea is a condition where your breathing stops and starts during sleep. We will prescribe a different CPAP mask that fits snugly against your nose to improve the seal and comfort. The prescription will be sent to [facility]. We may also obtain a report on your current CPAP machine to assess the pressure settings.  -OBESITY: Obesity is a condition where excess body fat affects your health. Your shortness of breath is worsened by weight gain. We recommend discussing weight loss strategies with your primary care provider, including the potential use of weight loss injections like Mounjaro or Ozempic. Additionally, we encourage you to start a structured exercise program to improve your conditioning and aid in weight loss.  INSTRUCTIONS: Please follow up with your primary care provider to discuss weight loss strategies and the potential use of weight loss injections. Additionally, ensure regular blood pressure monitoring and consider starting a structured exercise program to improve your conditioning and aid in weight loss.                      Contains text generated by Abridge.                                 Contains text generated by Abridge.

## 2024-07-05 DIAGNOSIS — M25532 Pain in left wrist: Secondary | ICD-10-CM | POA: Diagnosis not present

## 2024-07-05 DIAGNOSIS — S8992XA Unspecified injury of left lower leg, initial encounter: Secondary | ICD-10-CM | POA: Diagnosis not present

## 2024-07-15 DIAGNOSIS — S8002XA Contusion of left knee, initial encounter: Secondary | ICD-10-CM | POA: Diagnosis not present

## 2024-07-15 DIAGNOSIS — S8012XA Contusion of left lower leg, initial encounter: Secondary | ICD-10-CM | POA: Diagnosis not present

## 2024-07-15 DIAGNOSIS — Z9181 History of falling: Secondary | ICD-10-CM | POA: Diagnosis not present

## 2024-07-15 DIAGNOSIS — Z13820 Encounter for screening for osteoporosis: Secondary | ICD-10-CM | POA: Diagnosis not present

## 2024-07-15 DIAGNOSIS — M25562 Pain in left knee: Secondary | ICD-10-CM | POA: Diagnosis not present

## 2024-07-15 DIAGNOSIS — M25552 Pain in left hip: Secondary | ICD-10-CM | POA: Diagnosis not present

## 2024-07-17 ENCOUNTER — Ambulatory Visit (HOSPITAL_BASED_OUTPATIENT_CLINIC_OR_DEPARTMENT_OTHER): Admitting: Physician Assistant

## 2024-07-25 ENCOUNTER — Other Ambulatory Visit (INDEPENDENT_AMBULATORY_CARE_PROVIDER_SITE_OTHER)

## 2024-07-25 ENCOUNTER — Ambulatory Visit: Admitting: Orthopaedic Surgery

## 2024-07-25 DIAGNOSIS — M25552 Pain in left hip: Secondary | ICD-10-CM

## 2024-07-25 NOTE — Progress Notes (Signed)
 Office Visit Note   Patient: Kayla Marshall           Date of Birth: 04-13-1951           MRN: [id number] Visit Date: 07/25/2024              Requested by: Teresa Channel, MD [phone] [place],  [place] [address] PCP: Teresa Channel, MD   Assessment & Plan: Visit Diagnoses:  1. Pain in left hip     Plan: History of Present Illness Kayla Marshall is a 73 year old female who presents with lower back pain following a fall.  On August 16, she fell on her left side and has since experienced significant lower back pain, described as 'really jammed'. She is concerned about potential fractures, particularly in her hip, due to a previous leg fracture that was initially undetected. Her gait is affected by neuropathy, and since the fall, her walking has not returned to baseline, primarily due to the lower back pain. She also experiences chronic sensitivity and trochanteric pain.  Exam shows baseline gait.  She can weight-bear to the left lower extremity without any problems.  No withdrawing to manipulation of the hip.  Results RADIOLOGY Hip X-ray: No acute abnormalities; degenerative changes present. Lumbar spine X-ray: Degenerative changes; no change from 2021 X-ray.  Assessment and Plan Left hip pain and degenerative joint disease Chronic left hip pain with degenerative joint disease. X-rays show no acute abnormalities, consistent with previous degenerative changes. Persistent trochanteric pain likely related to soft tissue injury from fall. - Reassured no acute fracture based on x-ray findings. - Advised rest and time for soft tissue healing.  Lumbar spine degenerative changes Chronic lumbar spine degenerative changes consistent with previous imaging from 2021. Lower back pain likely exacerbated by recent fall. - Advised monitoring of symptoms and conservative management.  Follow-Up Instructions: No follow-ups on file.   Orders:  Orders Placed This Encounter   Procedures   XR HIP UNILAT W OR W/O PELVIS 2-3 VIEWS LEFT   XR Lumbar Spine 2-3 Views   No orders of the defined types were placed in this encounter.     Procedures: No procedures performed   Clinical Data: No additional findings.   Subjective: Chief Complaint  Patient presents with   Left Hip - Pain    HPI  Review of Systems   Objective: Vital Signs: There were no vitals taken for this visit.  Physical Exam  Ortho Exam  Specialty Comments:  No specialty comments available.  Imaging: XR Lumbar Spine 2-3 Views Result Date: 07/25/2024 X-rays of the lumbar spine show no acute abnormalities.  XR HIP UNILAT W OR W/O PELVIS 2-3 VIEWS LEFT Result Date: 07/25/2024 Xrays of the pelvis and left hip show no acute abnormalities.    PMFS History: Patient Active Problem List   Diagnosis Date Noted   Dyspnea on exertion 05/30/2023   Chronic pain of right knee 10/25/2021   Diabetic peripheral neuropathy (HCC) 01/06/2020   Pain of neck with recent traumatic injury 12/12/2019   Deviated septum 10/01/2019   Left hip pain 02/13/2019   Chronic pain of left knee 07/17/2018   Morbid obesity (HCC) 07/17/2018   Body mass index 40.0-44.9, adult (HCC) 07/17/2018   Lumbar spondylosis 04/24/2018   Allergic rhinitis 04/24/2018   Anxiety disorder 04/24/2018   Digital mucous cyst of finger of left hand 10/08/2017   Acute medial meniscus tear of left knee 07/30/2017   OSA on CPAP  08/26/2015   Essential (primary) hypertension 04/05/2015   Palpitation 08/21/2012   Hypothyroidism 08/21/2012   Past Medical History:  Diagnosis Date   Allergies    Anemia    Anxiety    Arthritis    back   B12 deficiency    Back pain    Chewing difficulty    Cold intolerance    Depression    Diabetic peripheral neuropathy (HCC) 01/06/2020   Dry mouth    Fatigue    Gallbladder disease    GERD (gastroesophageal reflux disease)    Glaucoma    Hay fever    Headache    Hoarseness     Hypertension    Hypothyroidism    Irritable bowel    Joint pain    Lactose intolerance    Leg cramps    Muscle stiffness    Nervousness    Obesity    Palpitations    PPH (postpartum hemorrhage)    Prediabetes    Sciatic pain    Sciatica    Shortness of breath dyspnea    Sleep apnea    Sleep apnea    Stress    Swelling of lower extremity    Tinnitus    Torn meniscus    left   Vitamin D  deficiency    Weakness     Family History  Problem Relation Age of Onset   COPD Mother    Stroke Mother    Hypertension Mother    Anxiety disorder Mother    Heart attack Father    COPD Father    Heart disease Father    Depression Father    Heart disease Sister    Cancer Sister    Breast cancer Sister 14   Hypertension Brother     Past Surgical History:  Procedure Laterality Date   CHOLECYSTECTOMY  1990   DILATATION & CURETTAGE/HYSTEROSCOPY WITH MYOSURE N/A 11/12/2015   Procedure: DILATATION & CURETTAGE/HYSTEROSCOPY WITH  MYOSURE;  Surgeon: Hargis Paradise, MD;  Location: [facility];  Service: Gynecology;  Laterality: N/A;   Social History   Occupational History   Occupation: Research scientist (life sciences)  Tobacco Use   Smoking status: Never   Smokeless tobacco: Never  Vaping Use   Vaping status: Never Used  Substance and Sexual Activity   Alcohol use: Not Currently   Drug use: No   Sexual activity: Yes    Birth control/protection: Post-menopausal

## 2024-07-28 ENCOUNTER — Other Ambulatory Visit: Payer: Self-pay | Admitting: Family Medicine

## 2024-07-28 DIAGNOSIS — R198 Other specified symptoms and signs involving the digestive system and abdomen: Secondary | ICD-10-CM

## 2024-07-28 DIAGNOSIS — Z1211 Encounter for screening for malignant neoplasm of colon: Secondary | ICD-10-CM | POA: Diagnosis not present

## 2024-07-31 ENCOUNTER — Ambulatory Visit
Admission: RE | Admit: 2024-07-31 | Discharge: 2024-07-31 | Disposition: A | Discharge status: Home / Self Care | Source: Ambulatory Visit | Attending: Family Medicine | Admitting: Family Medicine

## 2024-07-31 DIAGNOSIS — R198 Other specified symptoms and signs involving the digestive system and abdomen: Secondary | ICD-10-CM

## 2024-07-31 DIAGNOSIS — K5730 Diverticulosis of large intestine without perforation or abscess without bleeding: Secondary | ICD-10-CM | POA: Diagnosis not present

## 2024-07-31 DIAGNOSIS — N200 Calculus of kidney: Secondary | ICD-10-CM | POA: Diagnosis not present

## 2024-07-31 DIAGNOSIS — K760 Fatty (change of) liver, not elsewhere classified: Secondary | ICD-10-CM | POA: Diagnosis not present

## 2024-07-31 MED ORDER — IOPAMIDOL (ISOVUE-370) INJECTION 76%
100.0000 mL | Freq: Once | INTRAVENOUS | Status: AC | PRN
  Administered 2024-07-31: 100 mL via INTRAVENOUS

## 2024-08-01 DIAGNOSIS — H0011 Chalazion right upper eyelid: Secondary | ICD-10-CM | POA: Diagnosis not present

## 2024-08-01 DIAGNOSIS — H0100A Unspecified blepharitis right eye, upper and lower eyelids: Secondary | ICD-10-CM | POA: Diagnosis not present

## 2024-08-01 DIAGNOSIS — H00015 Hordeolum externum left lower eyelid: Secondary | ICD-10-CM | POA: Diagnosis not present

## 2024-08-01 DIAGNOSIS — H0100B Unspecified blepharitis left eye, upper and lower eyelids: Secondary | ICD-10-CM | POA: Diagnosis not present

## 2024-08-05 DIAGNOSIS — R799 Abnormal finding of blood chemistry, unspecified: Secondary | ICD-10-CM | POA: Diagnosis not present

## 2024-08-05 DIAGNOSIS — D6489 Other specified anemias: Secondary | ICD-10-CM | POA: Diagnosis not present

## 2024-08-05 DIAGNOSIS — E039 Hypothyroidism, unspecified: Secondary | ICD-10-CM | POA: Diagnosis not present

## 2024-08-05 DIAGNOSIS — E063 Autoimmune thyroiditis: Secondary | ICD-10-CM | POA: Diagnosis not present

## 2024-08-05 DIAGNOSIS — E2749 Other adrenocortical insufficiency: Secondary | ICD-10-CM | POA: Diagnosis not present

## 2024-08-05 DIAGNOSIS — R946 Abnormal results of thyroid function studies: Secondary | ICD-10-CM | POA: Diagnosis not present

## 2024-08-05 DIAGNOSIS — E88819 Insulin resistance, unspecified: Secondary | ICD-10-CM | POA: Diagnosis not present

## 2024-08-05 DIAGNOSIS — R5383 Other fatigue: Secondary | ICD-10-CM | POA: Diagnosis not present

## 2024-08-05 DIAGNOSIS — Z7689 Persons encountering health services in other specified circumstances: Secondary | ICD-10-CM | POA: Diagnosis not present

## 2024-08-05 DIAGNOSIS — R627 Adult failure to thrive: Secondary | ICD-10-CM | POA: Diagnosis not present

## 2024-08-05 DIAGNOSIS — R947 Abnormal results of other endocrine function studies: Secondary | ICD-10-CM | POA: Diagnosis not present

## 2024-08-05 DIAGNOSIS — K760 Fatty (change of) liver, not elsewhere classified: Secondary | ICD-10-CM | POA: Diagnosis not present

## 2024-08-06 DIAGNOSIS — E2749 Other adrenocortical insufficiency: Secondary | ICD-10-CM | POA: Diagnosis not present

## 2024-08-06 DIAGNOSIS — K760 Fatty (change of) liver, not elsewhere classified: Secondary | ICD-10-CM | POA: Diagnosis not present

## 2024-08-06 DIAGNOSIS — E88819 Insulin resistance, unspecified: Secondary | ICD-10-CM | POA: Diagnosis not present

## 2024-08-06 DIAGNOSIS — R799 Abnormal finding of blood chemistry, unspecified: Secondary | ICD-10-CM | POA: Diagnosis not present

## 2024-08-06 DIAGNOSIS — R946 Abnormal results of thyroid function studies: Secondary | ICD-10-CM | POA: Diagnosis not present

## 2024-08-06 DIAGNOSIS — E063 Autoimmune thyroiditis: Secondary | ICD-10-CM | POA: Diagnosis not present

## 2024-08-06 DIAGNOSIS — D6489 Other specified anemias: Secondary | ICD-10-CM | POA: Diagnosis not present

## 2024-08-06 DIAGNOSIS — R5383 Other fatigue: Secondary | ICD-10-CM | POA: Diagnosis not present

## 2024-08-06 DIAGNOSIS — Z7689 Persons encountering health services in other specified circumstances: Secondary | ICD-10-CM | POA: Diagnosis not present

## 2024-08-06 DIAGNOSIS — R627 Adult failure to thrive: Secondary | ICD-10-CM | POA: Diagnosis not present

## 2024-08-06 DIAGNOSIS — E039 Hypothyroidism, unspecified: Secondary | ICD-10-CM | POA: Diagnosis not present

## 2024-08-06 DIAGNOSIS — R947 Abnormal results of other endocrine function studies: Secondary | ICD-10-CM | POA: Diagnosis not present

## 2024-08-25 ENCOUNTER — Other Ambulatory Visit (HOSPITAL_BASED_OUTPATIENT_CLINIC_OR_DEPARTMENT_OTHER)

## 2024-08-26 DIAGNOSIS — F419 Anxiety disorder, unspecified: Secondary | ICD-10-CM | POA: Diagnosis not present

## 2024-08-26 DIAGNOSIS — K760 Fatty (change of) liver, not elsewhere classified: Secondary | ICD-10-CM | POA: Diagnosis not present

## 2024-08-26 DIAGNOSIS — R300 Dysuria: Secondary | ICD-10-CM | POA: Diagnosis not present

## 2024-08-26 DIAGNOSIS — E1169 Type 2 diabetes mellitus with other specified complication: Secondary | ICD-10-CM | POA: Diagnosis not present

## 2024-08-26 DIAGNOSIS — R1012 Left upper quadrant pain: Secondary | ICD-10-CM | POA: Diagnosis not present

## 2024-08-26 DIAGNOSIS — I5189 Other ill-defined heart diseases: Secondary | ICD-10-CM | POA: Diagnosis not present

## 2024-08-26 DIAGNOSIS — B372 Candidiasis of skin and nail: Secondary | ICD-10-CM | POA: Diagnosis not present

## 2024-08-26 DIAGNOSIS — N200 Calculus of kidney: Secondary | ICD-10-CM | POA: Diagnosis not present

## 2024-08-26 DIAGNOSIS — G4733 Obstructive sleep apnea (adult) (pediatric): Secondary | ICD-10-CM | POA: Diagnosis not present

## 2024-08-26 DIAGNOSIS — I10 Essential (primary) hypertension: Secondary | ICD-10-CM | POA: Diagnosis not present

## 2024-08-27 ENCOUNTER — Ambulatory Visit (HOSPITAL_BASED_OUTPATIENT_CLINIC_OR_DEPARTMENT_OTHER)
Admission: RE | Admit: 2024-08-27 | Discharge: 2024-08-27 | Disposition: A | Discharge status: Home / Self Care | Source: Ambulatory Visit | Attending: Family Medicine | Admitting: Family Medicine

## 2024-08-27 DIAGNOSIS — E2839 Other primary ovarian failure: Secondary | ICD-10-CM | POA: Insufficient documentation

## 2024-08-28 DIAGNOSIS — Z1212 Encounter for screening for malignant neoplasm of rectum: Secondary | ICD-10-CM | POA: Diagnosis not present

## 2024-09-03 LAB — COLOGUARD: COLOGUARD: NEGATIVE

## 2024-09-17 DIAGNOSIS — D649 Anemia, unspecified: Secondary | ICD-10-CM | POA: Diagnosis not present

## 2024-09-19 ENCOUNTER — Other Ambulatory Visit: Payer: Self-pay | Admitting: Family Medicine

## 2024-09-19 DIAGNOSIS — M48061 Spinal stenosis, lumbar region without neurogenic claudication: Secondary | ICD-10-CM

## 2024-09-22 ENCOUNTER — Encounter: Payer: Self-pay | Admitting: Radiology

## 2024-09-23 ENCOUNTER — Encounter: Payer: Self-pay | Admitting: Family Medicine

## 2024-09-24 DIAGNOSIS — E1169 Type 2 diabetes mellitus with other specified complication: Secondary | ICD-10-CM | POA: Diagnosis not present

## 2024-09-24 DIAGNOSIS — G4733 Obstructive sleep apnea (adult) (pediatric): Secondary | ICD-10-CM | POA: Diagnosis not present

## 2024-09-24 DIAGNOSIS — I10 Essential (primary) hypertension: Secondary | ICD-10-CM | POA: Diagnosis not present

## 2024-09-24 DIAGNOSIS — Z6841 Body Mass Index (BMI) 40.0 and over, adult: Secondary | ICD-10-CM | POA: Diagnosis not present

## 2024-09-25 ENCOUNTER — Ambulatory Visit
Admission: RE | Admit: 2024-09-25 | Discharge: 2024-09-25 | Disposition: A | Discharge status: Home / Self Care | Source: Ambulatory Visit | Attending: Family Medicine | Admitting: Family Medicine

## 2024-09-25 DIAGNOSIS — M5126 Other intervertebral disc displacement, lumbar region: Secondary | ICD-10-CM | POA: Diagnosis not present

## 2024-09-25 DIAGNOSIS — M48061 Spinal stenosis, lumbar region without neurogenic claudication: Secondary | ICD-10-CM

## 2024-09-25 DIAGNOSIS — M47816 Spondylosis without myelopathy or radiculopathy, lumbar region: Secondary | ICD-10-CM | POA: Diagnosis not present

## 2024-09-26 DIAGNOSIS — R030 Elevated blood-pressure reading, without diagnosis of hypertension: Secondary | ICD-10-CM | POA: Diagnosis not present

## 2024-09-26 DIAGNOSIS — I10 Essential (primary) hypertension: Secondary | ICD-10-CM | POA: Diagnosis not present

## 2024-10-03 ENCOUNTER — Ambulatory Visit: Admitting: Orthopaedic Surgery

## 2024-10-03 VITALS — Wt 248.8 lb

## 2024-10-03 DIAGNOSIS — G8929 Other chronic pain: Secondary | ICD-10-CM | POA: Diagnosis not present

## 2024-10-03 DIAGNOSIS — M545 Low back pain, unspecified: Secondary | ICD-10-CM

## 2024-10-03 DIAGNOSIS — Z6841 Body Mass Index (BMI) 40.0 and over, adult: Secondary | ICD-10-CM | POA: Diagnosis not present

## 2024-10-03 NOTE — Progress Notes (Signed)
 Office Visit Note   Patient: Kayla Marshall           Date of Birth: September 04, 1951           MRN: [id number] Visit Date: 10/03/2024              Requested by: Teresa Channel, MD [phone] [place],  [place] [address] PCP: Teresa Channel, MD   Assessment & Plan: Visit Diagnoses:  1. Chronic bilateral low back pain without sciatica   2. Morbid obesity (HCC)   3. Body mass index 40.0-44.9, adult (HCC)     Plan: History of Present Illness Kayla Marshall is a 73 year old female who presents with persistent left knee pain and numbness following a fall.  In August, she experienced a fall impacting her whole left side, with the left knee taking the brunt of the impact. Since the fall, she has persistent hardness and numbness in the knee area, with occasional numbness. X-rays were performed at the time of the fall.  She has a history of lumbar spine issues, with an MRI showing no findings to account for her knee symptoms. She also experiences a sensation of heaviness in her legs.  She previously engaged in physical therapy during the summer but has not continued with the exercises due to difficulty fitting them into her routine. She is concerned about building muscle mass and maintaining strength, particularly in light of her back condition. She wakes up with severe leg cramps, which she questions might be related to her back issues.  Physical Exam MUSCULOSKELETAL: No swelling in the left knee.  Normal knee exam.  Results RADIOLOGY Left knee X-ray: No structural abnormalities (August 2025) Lumbar spine MRI: No findings accounting for knee symptoms  Assessment and Plan Left knee contusion with numbness Numbness likely due to nerve contusion. No structural damage on imaging. No swelling or need for orthopedic intervention. - Allow time for natural healing.  Lumbar spine degenerative arthritis Degenerative arthritis contributing to back pain and leg heaviness. Emphasized  consistent exercise and dietary changes to manage symptoms and prevent further degeneration.  - Referred to physical therapy for strengthening exercises and core stability. - Encouraged consistent daily exercise and dietary changes to increase muscle mass and reduce weight. - Advised consultation with primary care physician for leg cramps and possible electrolyte imbalance.  Total face to face encounter time was greater than 25 minutes and over half of this time was spent in counseling and/or coordination of care.  Follow-Up Instructions: No follow-ups on file.   Orders:  No orders of the defined types were placed in this encounter.  No orders of the defined types were placed in this encounter.     Procedures: No procedures performed   Clinical Data: No additional findings.   Subjective: Chief Complaint  Patient presents with   Left Knee - Follow-up    HPI  Review of Systems   Objective: Vital Signs: Wt 248 lb 12.8 oz (112.9 kg)   BMI 45.51 kg/m   Physical Exam  Ortho Exam  Specialty Comments:  No specialty comments available.  Imaging: No results found.   PMFS History: Patient Active Problem List   Diagnosis Date Noted   Chronic bilateral low back pain without sciatica 10/03/2024   Dyspnea on exertion 05/30/2023   Chronic pain of right knee 10/25/2021   Diabetic peripheral neuropathy (HCC) 01/06/2020   Pain of neck with recent traumatic injury 12/12/2019   Deviated septum 10/01/2019  Left hip pain 02/13/2019   Chronic pain of left knee 07/17/2018   Morbid obesity (HCC) 07/17/2018   Body mass index 40.0-44.9, adult (HCC) 07/17/2018   Lumbar spondylosis 04/24/2018   Allergic rhinitis 04/24/2018   Anxiety disorder 04/24/2018   Digital mucous cyst of finger of left hand 10/08/2017   Acute medial meniscus tear of left knee 07/30/2017   OSA on CPAP 08/26/2015   Essential (primary) hypertension 04/05/2015   Palpitation 08/21/2012   Hypothyroidism  08/21/2012   Past Medical History:  Diagnosis Date   Allergies    Anemia    Anxiety    Arthritis    back   B12 deficiency    Back pain    Chewing difficulty    Cold intolerance    Depression    Diabetic peripheral neuropathy (HCC) 01/06/2020   Dry mouth    Fatigue    Gallbladder disease    GERD (gastroesophageal reflux disease)    Glaucoma    Hay fever    Headache    Hoarseness    Hypertension    Hypothyroidism    Irritable bowel    Joint pain    Lactose intolerance    Leg cramps    Muscle stiffness    Nervousness    Obesity    Palpitations    PPH (postpartum hemorrhage)    Prediabetes    Sciatic pain    Sciatica    Shortness of breath dyspnea    Sleep apnea    Sleep apnea    Stress    Swelling of lower extremity    Tinnitus    Torn meniscus    left   Vitamin D  deficiency    Weakness     Family History  Problem Relation Age of Onset   COPD Mother    Stroke Mother    Hypertension Mother    Anxiety disorder Mother    Heart attack Father    COPD Father    Heart disease Father    Depression Father    Heart disease Sister    Cancer Sister    Breast cancer Sister 75   Hypertension Brother     Past Surgical History:  Procedure Laterality Date   CHOLECYSTECTOMY  1990   DILATATION & CURETTAGE/HYSTEROSCOPY WITH MYOSURE N/A 11/12/2015   Procedure: DILATATION & CURETTAGE/HYSTEROSCOPY WITH  MYOSURE;  Surgeon: Hargis Paradise, MD;  Location: [facility];  Service: Gynecology;  Laterality: N/A;   Social History   Occupational History   Occupation: Research scientist (life sciences)  Tobacco Use   Smoking status: Never   Smokeless tobacco: Never  Vaping Use   Vaping status: Never Used  Substance and Sexual Activity   Alcohol use: Not Currently   Drug use: No   Sexual activity: Yes    Birth control/protection: Post-menopausal

## 2024-10-07 DIAGNOSIS — H6122 Impacted cerumen, left ear: Secondary | ICD-10-CM | POA: Diagnosis not present

## 2024-10-07 DIAGNOSIS — I10 Essential (primary) hypertension: Secondary | ICD-10-CM | POA: Diagnosis not present

## 2024-10-20 DIAGNOSIS — R197 Diarrhea, unspecified: Secondary | ICD-10-CM | POA: Diagnosis not present

## 2024-10-20 DIAGNOSIS — R42 Dizziness and giddiness: Secondary | ICD-10-CM | POA: Diagnosis not present

## 2024-10-22 DIAGNOSIS — R3915 Urgency of urination: Secondary | ICD-10-CM | POA: Diagnosis not present

## 2024-10-22 DIAGNOSIS — R509 Fever, unspecified: Secondary | ICD-10-CM | POA: Diagnosis not present

## 2024-10-22 DIAGNOSIS — R944 Abnormal results of kidney function studies: Secondary | ICD-10-CM | POA: Diagnosis not present

## 2024-11-06 DIAGNOSIS — M545 Low back pain, unspecified: Secondary | ICD-10-CM | POA: Diagnosis not present

## 2024-11-06 DIAGNOSIS — R110 Nausea: Secondary | ICD-10-CM | POA: Diagnosis not present

## 2024-11-06 DIAGNOSIS — N179 Acute kidney failure, unspecified: Secondary | ICD-10-CM | POA: Diagnosis not present

## 2024-11-07 DIAGNOSIS — H00015 Hordeolum externum left lower eyelid: Secondary | ICD-10-CM | POA: Diagnosis not present

## 2024-11-10 DIAGNOSIS — H35371 Puckering of macula, right eye: Secondary | ICD-10-CM | POA: Diagnosis not present

## 2024-11-10 DIAGNOSIS — H52203 Unspecified astigmatism, bilateral: Secondary | ICD-10-CM | POA: Diagnosis not present

## 2024-11-10 DIAGNOSIS — H0015 Chalazion left lower eyelid: Secondary | ICD-10-CM | POA: Diagnosis not present

## 2024-11-10 DIAGNOSIS — E119 Type 2 diabetes mellitus without complications: Secondary | ICD-10-CM | POA: Diagnosis not present

## 2024-11-10 DIAGNOSIS — H401131 Primary open-angle glaucoma, bilateral, mild stage: Secondary | ICD-10-CM | POA: Diagnosis not present

## 2024-11-12 DIAGNOSIS — I10 Essential (primary) hypertension: Secondary | ICD-10-CM | POA: Diagnosis not present

## 2024-11-12 DIAGNOSIS — N289 Disorder of kidney and ureter, unspecified: Secondary | ICD-10-CM | POA: Diagnosis not present

## 2024-11-12 DIAGNOSIS — K5900 Constipation, unspecified: Secondary | ICD-10-CM | POA: Diagnosis not present

## 2024-11-12 DIAGNOSIS — R140 Abdominal distension (gaseous): Secondary | ICD-10-CM | POA: Diagnosis not present

## 2024-11-12 DIAGNOSIS — I499 Cardiac arrhythmia, unspecified: Secondary | ICD-10-CM | POA: Diagnosis not present

## 2024-12-24 NOTE — Progress Notes (Signed)
 Kayla Marshall                                          MRN: [id number]   12/24/2024   The [facility] Quality Team Specialist reviewed this patient medical record for the purposes of chart review for care gap closure. The following were reviewed: chart review for care gap closure-controlling blood pressure.    [facility] Quality Team
